# Patient Record
Sex: Male | Born: 1980 | Race: Black or African American | Hispanic: No | Marital: Single | Smoking: Former smoker
Health system: Southern US, Community
[De-identification: ages and names within clinical notes are randomized; demographics above are authoritative.]

## PROBLEM LIST (undated history)

## (undated) ENCOUNTER — Emergency Department (HOSPITAL_COMMUNITY): Payer: Commercial Managed Care - HMO

## (undated) DIAGNOSIS — J45909 Unspecified asthma, uncomplicated: Secondary | ICD-10-CM

## (undated) DIAGNOSIS — J189 Pneumonia, unspecified organism: Secondary | ICD-10-CM

## (undated) DIAGNOSIS — K5190 Ulcerative colitis, unspecified, without complications: Secondary | ICD-10-CM

## (undated) DIAGNOSIS — J40 Bronchitis, not specified as acute or chronic: Secondary | ICD-10-CM

---
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
## 1998-04-25 ENCOUNTER — Encounter: Admission: RE | Admit: 1998-04-25 | Discharge: 1998-04-25 | Payer: Self-pay | Admitting: Internal Medicine

## 1999-05-16 ENCOUNTER — Encounter: Admission: RE | Admit: 1999-05-16 | Discharge: 1999-05-16 | Payer: Self-pay | Admitting: Internal Medicine

## 2009-07-27 ENCOUNTER — Emergency Department (HOSPITAL_COMMUNITY): Admission: EM | Admit: 2009-07-27 | Discharge: 2009-07-27 | Payer: Self-pay | Admitting: Family Medicine

## 2009-09-28 ENCOUNTER — Emergency Department (HOSPITAL_COMMUNITY): Admission: EM | Admit: 2009-09-28 | Discharge: 2009-09-28 | Payer: Self-pay | Admitting: Emergency Medicine

## 2009-11-23 ENCOUNTER — Encounter (INDEPENDENT_AMBULATORY_CARE_PROVIDER_SITE_OTHER): Payer: Self-pay | Admitting: Internal Medicine

## 2009-11-23 ENCOUNTER — Ambulatory Visit: Payer: Self-pay | Admitting: Internal Medicine

## 2009-11-23 LAB — CONVERTED CEMR LAB
Albumin: 4.3 g/dL (ref 3.5–5.2)
Alkaline Phosphatase: 118 units/L — ABNORMAL HIGH (ref 39–117)
BUN: 8 mg/dL (ref 6–23)
Calcium: 9.6 mg/dL (ref 8.4–10.5)
Chloride: 103 meq/L (ref 96–112)
Eosinophils Absolute: 0.7 10*3/uL (ref 0.0–0.7)
Glucose, Bld: 79 mg/dL (ref 70–99)
Hemoglobin: 11.8 g/dL — ABNORMAL LOW (ref 13.0–17.0)
Lymphs Abs: 1.9 10*3/uL (ref 0.7–4.0)
MCV: 76.5 fL — ABNORMAL LOW (ref 78.0–100.0)
Monocytes Absolute: 0.5 10*3/uL (ref 0.1–1.0)
Monocytes Relative: 10 % (ref 3–12)
Neutrophils Relative %: 39 % — ABNORMAL LOW (ref 43–77)
Potassium: 4.3 meq/L (ref 3.5–5.3)
RBC: 4.97 M/uL (ref 4.22–5.81)
WBC: 5.2 10*3/uL (ref 4.0–10.5)

## 2010-04-15 LAB — POCT I-STAT, CHEM 8
HCT: 39 % (ref 39.0–52.0)
Hemoglobin: 13.3 g/dL (ref 13.0–17.0)
Potassium: 3.9 mEq/L (ref 3.5–5.1)
Sodium: 146 mEq/L — ABNORMAL HIGH (ref 135–145)

## 2011-05-03 ENCOUNTER — Emergency Department (INDEPENDENT_AMBULATORY_CARE_PROVIDER_SITE_OTHER)
Admission: EM | Admit: 2011-05-03 | Discharge: 2011-05-03 | Disposition: A | Discharge status: Home / Self Care | Payer: Self-pay | Source: Home / Self Care | Attending: Family Medicine | Admitting: Family Medicine

## 2011-05-03 ENCOUNTER — Encounter (HOSPITAL_COMMUNITY): Payer: Self-pay

## 2011-05-03 DIAGNOSIS — K5190 Ulcerative colitis, unspecified, without complications: Secondary | ICD-10-CM

## 2011-05-03 MED ORDER — SULFASALAZINE 500 MG PO TABS: 1000.0000 mg | ORAL_TABLET | Freq: Three times a day (TID) | ORAL | Status: DC

## 2011-05-03 NOTE — ED Notes (Signed)
States he is out of his medication for ulcerative colitis , and is requesting a refill; sees MD at [facility], but cannot be seen for another month or so; NAD, denies pain at present

## 2011-05-03 NOTE — ED Provider Notes (Signed)
History     CSN: [id number]  Arrival date & time 05/03/11  1211   First MD Initiated Contact with Patient 05/03/11 1211      Chief Complaint  Patient presents with  . Abdominal Pain    (Consider location/radiation/quality/duration/timing/severity/associated sxs/prior treatment) HPI Comments: Jerome Irwin presents for refill of his medication for ulcerative colitis. He is followed by his primary care provider at [facility] but could not get an appointment for another month. He denies any flares. He reports that he was diagnosed while incarcerated in [place]. He has not been evaluated by a gastroenterologist here yet.  Patient is a 31 y.o. male presenting with abdominal pain. The history is provided by the patient.  Abdominal Pain The primary symptoms of the illness include abdominal pain. The primary symptoms of the illness do not include fever, nausea, vomiting, diarrhea or hematochezia. The current episode started more than 2 days ago. The problem has not changed since onset. The abdominal pain began more than 2 days ago. The abdominal pain has been unchanged since its onset. The abdominal pain is generalized. The abdominal pain does not radiate. The abdominal pain is relieved by nothing.  Significant associated medical issues include inflammatory bowel disease.    History reviewed. No pertinent past medical history.  History reviewed. No pertinent past surgical history.  History reviewed. No pertinent family history.  History  Substance Use Topics  . Smoking status: Current Everyday Smoker  . Smokeless tobacco: Not on file  . Alcohol Use: No      Review of Systems  Constitutional: Negative.  Negative for fever.  HENT: Negative.   Eyes: Negative.   Respiratory: Negative.   Cardiovascular: Negative.   Gastrointestinal: Positive for abdominal pain. Negative for nausea, vomiting, diarrhea and hematochezia.  Genitourinary: Negative.   Musculoskeletal: Negative.   Skin:  Negative.   Neurological: Negative.     Allergies  Review of patient's allergies indicates no known allergies.  Home Medications   Current Outpatient Rx  Name Route Sig Dispense Refill  . SULFASALAZINE 500 MG PO TABS Oral Take 2 tablets (1,000 mg total) by mouth 3 (three) times daily. 90 tablet 1    BP 107/60  Pulse 80  Temp(Src) 99 F (37.2 C) (Oral)  Resp 12  SpO2 96%  Physical Exam  Nursing note and vitals reviewed. Constitutional: He is oriented to person, place, and time. He appears well-developed and well-nourished.  HENT:  Head: Normocephalic and atraumatic.  Eyes: EOM are normal.  Neck: Normal range of motion.  Pulmonary/Chest: Effort normal.  Abdominal: Soft. Normal appearance and bowel sounds are normal. There is tenderness in the periumbilical area and left lower quadrant. There is no rigidity, no rebound and no guarding.  Musculoskeletal: Normal range of motion.  Neurological: He is alert and oriented to person, place, and time.  Skin: Skin is warm and dry.  Psychiatric: His behavior is normal.    ED Course  Procedures (including critical care time)  Labs Reviewed - No data to display No results found.   1. Ulcerative colitis       MDM  Refilled medication for sulfasalazine, 1000 mg three times daily; follow up with Charmaine Downs, MD 05/03/11 1319

## 2011-05-03 NOTE — Discharge Instructions (Signed)
Continue your medication as directed. Please follow up with your primary provider as scheduled. Please return to care should your symptoms worsen in any way.

## 2011-08-08 ENCOUNTER — Emergency Department (INDEPENDENT_AMBULATORY_CARE_PROVIDER_SITE_OTHER)
Admission: EM | Admit: 2011-08-08 | Discharge: 2011-08-08 | Disposition: A | Discharge status: Home / Self Care | Payer: Self-pay | Source: Home / Self Care | Attending: Family Medicine | Admitting: Family Medicine

## 2011-08-08 ENCOUNTER — Encounter (HOSPITAL_COMMUNITY): Payer: Self-pay | Admitting: *Deleted

## 2011-08-08 DIAGNOSIS — L02811 Cutaneous abscess of head [any part, except face]: Secondary | ICD-10-CM

## 2011-08-08 DIAGNOSIS — L02818 Cutaneous abscess of other sites: Secondary | ICD-10-CM

## 2011-08-08 HISTORY — DX: Ulcerative colitis, unspecified, without complications: K51.90

## 2011-08-08 MED ORDER — SULFAMETHOXAZOLE-TRIMETHOPRIM 800-160 MG PO TABS: 1.0000 | ORAL_TABLET | Freq: Two times a day (BID) | ORAL | Status: AC

## 2011-08-08 NOTE — ED Provider Notes (Signed)
History     CSN: [id number]  Arrival date & time 08/08/11  1200   First MD Initiated Contact with Patient 08/08/11 1215      Chief Complaint  Patient presents with  . Abscess    (Consider location/radiation/quality/duration/timing/severity/associated sxs/prior treatment) HPI Comments: The patient reports he has an abscess on his scalp. Onset 3-4 months ago. States it has drained some purulent fluid on occasion. Now has noted hair loss. Slightly painful to touch. No known hx of mrsa.   The history is provided by the patient.    Past Medical History  Diagnosis Date  . Ulcerative colitis     History reviewed. No pertinent past surgical history.  History reviewed. No pertinent family history.  History  Substance Use Topics  . Smoking status: Current Everyday Smoker -- 0.5 packs/day    Types: Cigarettes  . Smokeless tobacco: Not on file  . Alcohol Use: Yes     socially      Review of Systems  HENT: Negative.   Respiratory: Negative.   Cardiovascular: Negative.   Gastrointestinal: Negative.   Genitourinary: Negative.   Musculoskeletal: Negative.     Allergies  Review of patient's allergies indicates no known allergies.  Home Medications   Current Outpatient Rx  Name Route Sig Dispense Refill  . SULFASALAZINE 500 MG PO TABS Oral Take 2 tablets (1,000 mg total) by mouth 3 (three) times daily. 90 tablet 1  . SULFAMETHOXAZOLE-TRIMETHOPRIM 800-160 MG PO TABS Oral Take 1 tablet by mouth every 12 (twelve) hours. 20 tablet 0    BP 113/77  Pulse 72  Temp 98.3 F (36.8 C) (Oral)  Resp 18  SpO2 97%  Physical Exam  Nursing note and vitals reviewed. Constitutional: He appears well-developed and well-nourished. No distress.  HENT:  Head: Normocephalic.       Small cystic mass right parietal area that is tender and flatulent. Punctured with a 22 ga steril needle. Moderate amt of purulent discharge expressed. Culture collected. Patient tolerated well. No dressing  needed.   Cardiovascular: Normal rate and regular rhythm.   Pulmonary/Chest: Effort normal and breath sounds normal.  Skin: Skin is warm and dry.    ED Course  Procedures (including critical care time)   Labs Reviewed  CULTURE, ROUTINE-ABSCESS   No results found.   1. Scalp abscess       MDM          Luna Glasgow, MD 08/08/11 1346

## 2011-08-08 NOTE — ED Notes (Signed)
Pt reports an abscess on his scalp the past 3-4 months.  He noticed some pus draining about a month ago.  He has lost the hair around this area

## 2011-08-11 LAB — CULTURE, ROUTINE-ABSCESS

## 2014-08-09 ENCOUNTER — Emergency Department (HOSPITAL_COMMUNITY)
Admission: EM | Admit: 2014-08-09 | Discharge: 2014-08-09 | Disposition: A | Discharge status: Home / Self Care | Payer: Self-pay | Attending: Emergency Medicine | Admitting: Emergency Medicine

## 2014-08-09 ENCOUNTER — Encounter (HOSPITAL_COMMUNITY): Payer: Self-pay | Admitting: Emergency Medicine

## 2014-08-09 ENCOUNTER — Emergency Department (HOSPITAL_COMMUNITY): Payer: Self-pay

## 2014-08-09 DIAGNOSIS — Z8719 Personal history of other diseases of the digestive system: Secondary | ICD-10-CM | POA: Insufficient documentation

## 2014-08-09 DIAGNOSIS — F1410 Cocaine abuse, uncomplicated: Secondary | ICD-10-CM | POA: Insufficient documentation

## 2014-08-09 DIAGNOSIS — J159 Unspecified bacterial pneumonia: Secondary | ICD-10-CM | POA: Insufficient documentation

## 2014-08-09 DIAGNOSIS — Z79899 Other long term (current) drug therapy: Secondary | ICD-10-CM | POA: Insufficient documentation

## 2014-08-09 DIAGNOSIS — Z720 Tobacco use: Secondary | ICD-10-CM | POA: Insufficient documentation

## 2014-08-09 DIAGNOSIS — J189 Pneumonia, unspecified organism: Secondary | ICD-10-CM

## 2014-08-09 LAB — CBC
HCT: 40.6 % (ref 39.0–52.0)
Hemoglobin: 13.3 g/dL (ref 13.0–17.0)
MCH: 28.6 pg (ref 26.0–34.0)
MCHC: 32.8 g/dL (ref 30.0–36.0)
MCV: 87.3 fL (ref 78.0–100.0)
Platelets: 312 10*3/uL (ref 150–400)
RBC: 4.65 MIL/uL (ref 4.22–5.81)
RDW: 15.8 % — ABNORMAL HIGH (ref 11.5–15.5)
WBC: 5.2 10*3/uL (ref 4.0–10.5)

## 2014-08-09 LAB — BASIC METABOLIC PANEL
Anion gap: 6 (ref 5–15)
BUN: 11 mg/dL (ref 6–20)
CO2: 27 mmol/L (ref 22–32)
Calcium: 9.3 mg/dL (ref 8.9–10.3)
Chloride: 106 mmol/L (ref 101–111)
Creatinine, Ser: 1.26 mg/dL — ABNORMAL HIGH (ref 0.61–1.24)
GFR calc Af Amer: >60 mL/min (ref >60)
GFR calc non Af Amer: >60 mL/min (ref >60)
Glucose, Bld: 114 mg/dL — ABNORMAL HIGH (ref 65–99)
Potassium: 3.5 mmol/L (ref 3.5–5.1)
Sodium: 139 mmol/L (ref 135–145)

## 2014-08-09 LAB — TROPONIN I: Troponin I: <0.03 ng/mL (ref <0.031)

## 2014-08-09 MED ORDER — AZITHROMYCIN 250 MG PO TABS: 250.0000 mg | ORAL_TABLET | Freq: Every day | ORAL | Status: DC

## 2014-08-09 NOTE — ED Notes (Signed)
Pt. Stated, I started having SOB about 2-3 weeks ago and it was worse yesterday and today.Marland Kitchen  i also use Cocaine , yesterday i had a gram .  Not sure if tha is causing it.

## 2014-08-09 NOTE — ED Notes (Addendum)
Pt stated that when he uses cocaine and has sex simultaneously he gets SOB. He is concerned that his heart and lungs are affected.

## 2014-08-09 NOTE — ED Provider Notes (Signed)
CSN: [id number]     Arrival date & time 08/09/14  1151 History   First MD Initiated Contact with Patient 08/09/14 1233     Chief Complaint  Patient presents with  . Shortness of Breath     (Consider location/radiation/quality/duration/timing/severity/associated sxs/prior Treatment) HPI Jerome Irwin is a 34 year old male with past medical history of ulcerative colitis, cocaine abuse who presents the ER complaining of intermittent shortness of breath. Patient states his symptoms have been intermittent over the past month. Patient states he associates this shortness of breath with using cocaine. Patient states "when I get high and exert myself, I become short of breath". Patient states it is particularly worse during intercourse. Patient denies having any shortness of breath episodes that are not associated with cocaine use. Patient also reports a productive cough over the past several weeks. Patient denies having any other health problems. Patient denies any chest pain, palpitations, headache, blurred vision, dizziness, weakness, nausea, vomiting, abdominal pain.  Past Medical History  Diagnosis Date  . Ulcerative colitis    History reviewed. No pertinent past surgical history. No family history on file. History  Substance Use Topics  . Smoking status: Current Every Day Smoker -- 0.50 packs/day    Types: Cigarettes  . Smokeless tobacco: Not on file  . Alcohol Use: Yes     Comment: socially    Review of Systems  Constitutional: Negative for fever.  HENT: Negative for trouble swallowing.   Eyes: Negative for visual disturbance.  Respiratory: Negative for shortness of breath.   Cardiovascular: Negative for chest pain.  Gastrointestinal: Negative for nausea, vomiting and abdominal pain.  Genitourinary: Negative for dysuria.  Musculoskeletal: Negative for neck pain.  Skin: Negative for rash.  Neurological: Negative for dizziness, weakness and numbness.  Psychiatric/Behavioral:  Negative.     Allergies  Review of patient's allergies indicates no known allergies.  Home Medications   Prior to Admission medications   Medication Sig Start Date End Date Taking? Authorizing Provider  azithromycin (ZITHROMAX) 250 MG tablet Take 1 tablet (250 mg total) by mouth daily. Take first 2 tablets together, then 1 every day until finished. 08/09/14   Dahlia Bailiff, PA-C  sulfaSALAzine (AZULFIDINE) 500 MG tablet Take 2 tablets (1,000 mg total) by mouth 3 (three) times daily. 05/03/11   Earlie Counts, MD   BP 93/62 mmHg  Pulse 50  Temp(Src) 98 F (36.7 C) (Oral)  Resp 18  SpO2 99% Physical Exam  Constitutional: He is oriented to person, place, and time. He appears well-developed and well-nourished. No distress.  HENT:  Head: Normocephalic and atraumatic.  Mouth/Throat: Oropharynx is clear and moist. No oropharyngeal exudate.  Eyes: Right eye exhibits no discharge. Left eye exhibits no discharge. No scleral icterus.  Neck: Normal range of motion.  Cardiovascular: Normal rate, regular rhythm and normal heart sounds.   No murmur heard. Pulmonary/Chest: Effort normal and breath sounds normal. No respiratory distress.  Abdominal: Soft. There is no tenderness.  Musculoskeletal: Normal range of motion. He exhibits no edema or tenderness.  Neurological: He is alert and oriented to person, place, and time. No cranial nerve deficit. Coordination normal.  Skin: Skin is warm and dry. No rash noted. He is not diaphoretic.  Psychiatric: He has a normal mood and affect.    ED Course  Procedures (including critical care time) Labs Review Labs Reviewed  BASIC METABOLIC PANEL - Abnormal; Notable for the following:    Glucose, Bld 114 (*)    Creatinine, Ser 1.26 (*)  All other components within normal limits  CBC - Abnormal; Notable for the following:    RDW 15.8 (*)    All other components within normal limits  TROPONIN I    Imaging Review Dg Chest 2 View  08/09/2014   CLINICAL  DATA:  Shortness of breath for several days.  EXAM: CHEST  2 VIEW  COMPARISON:  None.  FINDINGS: The heart size and mediastinal contours are within normal limits. Patchy airspace disease is seen in right middle lobe, suspicious for pneumonia. Left lung is clear. No evidence of pleural effusion.  IMPRESSION: Right middle lobe airspace disease, suspicious for pneumonia.   Electronically Signed   By: Earle Gell M.D.   On: 08/09/2014 12:34     EKG Interpretation   Date/Time:  Tuesday August 09 2014 11:56:05 EDT Ventricular Rate:  68 PR Interval:  130 QRS Duration: 84 QT Interval:  404 QTC Calculation: 429 R Axis:   87 Text Interpretation:  Normal sinus rhythm with sinus arrhythmia Normal ECG  No old tracing to compare Confirmed by Vanduser  MD, Taylor ([id number]) on  08/09/2014 1:42:25 PM      MDM   Final diagnoses:  CAP (community acquired pneumonia)  Cocaine abuse    Patient is asymptomatic throughout stay here. Despite patient's cocaine abuse, do not see any signs or symptoms consistent with end organ damage related to this. Patient is hemodynamic stable and in no acute distress. EKG without evidence of injury or ectopy. Troponin negative greater than 6 hours after onset of symptoms, which of note have been persisting and intermittent over the last month. Patient denies any chest pain. There is no concern for ACS at this time. Patient is PERC negative, no concern for PE. Chest x-ray does show evidence of possible pneumonia, given the patient does endorse a productive cough for the past several weeks and patient's history smoking along with these findings, we will treat patient as outpatient for community-acquired pneumonia. I discussed at length the dangers associated with cocaine use and abuse, and strongly recommended patient to stop using cocaine. Patient given substance abuse resources to follow up with as outpatient, which patient states he is interested in. Patient denies any suicidal or  homicidal ideation. Patient stable for discharge at this time, return precautions discussed, patient verbalizes understanding and agreement with this plan.  BP 93/62 mmHg  Pulse 50  Temp(Src) 98 F (36.7 C) (Oral)  Resp 18  SpO2 99%  Signed,  Dahlia Bailiff, PA-C 3:20 PM   Dahlia Bailiff, PA-C 08/09/14 [place], MD 08/12/14 1436

## 2014-08-09 NOTE — ED Notes (Signed)
NAD at this time. Pt is stable and leaving with his mother.

## 2014-08-09 NOTE — Discharge Instructions (Signed)
Pneumonia Pneumonia is an infection of the lungs.  CAUSES Pneumonia may be caused by bacteria or a virus. Usually, these infections are caused by breathing infectious particles into the lungs (respiratory tract). SIGNS AND SYMPTOMS   Cough.  Fever.  Chest pain.  Increased rate of breathing.  Wheezing.  Mucus production. DIAGNOSIS  If you have the common symptoms of pneumonia, your health care provider will typically confirm the diagnosis with a chest X-ray. The X-ray will show an abnormality in the lung (pulmonary infiltrate) if you have pneumonia. Other tests of your blood, urine, or sputum may be done to find the specific cause of your pneumonia. Your health care provider may also do tests (blood gases or pulse oximetry) to see how well your lungs are working. TREATMENT  Some forms of pneumonia may be spread to other people when you cough or sneeze. You may be asked to wear a mask before and during your exam. Pneumonia that is caused by bacteria is treated with antibiotic medicine. Pneumonia that is caused by the influenza virus may be treated with an antiviral medicine. Most other viral infections must run their course. These infections will not respond to antibiotics.  HOME CARE INSTRUCTIONS   Cough suppressants may be used if you are losing too much rest. However, coughing protects you by clearing your lungs. You should avoid using cough suppressants if you can.  Your health care provider may have prescribed medicine if he or she thinks your pneumonia is caused by bacteria or influenza. Finish your medicine even if you start to feel better.  Your health care provider may also prescribe an expectorant. This loosens the mucus to be coughed up.  Take medicines only as directed by your health care provider.  Do not smoke. Smoking is a common cause of bronchitis and can contribute to pneumonia. If you are a smoker and continue to smoke, your cough may last several weeks after your  pneumonia has cleared.  A cold steam vaporizer or humidifier in your room or home may help loosen mucus.  Coughing is often worse at night. Sleeping in a semi-upright position in a recliner or using a couple pillows under your head will help with this.  Get rest as you feel it is needed. Your body will usually let you know when you need to rest. PREVENTION A pneumococcal shot (vaccine) is available to prevent a common bacterial cause of pneumonia. This is usually suggested for:  People over 81 years old.  Patients on chemotherapy.  People with chronic lung problems, such as bronchitis or emphysema.  People with immune system problems. If you are over 65 or have a high risk condition, you may receive the pneumococcal vaccine if you have not received it before. In some countries, a routine influenza vaccine is also recommended. This vaccine can help prevent some cases of pneumonia.You may be offered the influenza vaccine as part of your care. If you smoke, it is time to quit. You may receive instructions on how to stop smoking. Your health care provider can provide medicines and counseling to help you quit. SEEK MEDICAL CARE IF: You have a fever. SEEK IMMEDIATE MEDICAL CARE IF:   Your illness becomes worse. This is especially true if you are elderly or weakened from any other disease.  You cannot control your cough with suppressants and are losing sleep.  You begin coughing up blood.  You develop pain which is getting worse or is uncontrolled with medicines.  Any of the symptoms  which initially brought you in for treatment are getting worse rather than better.  You develop shortness of breath or chest pain. MAKE SURE YOU:   Understand these instructions.  Will watch your condition.  Will get help right away if you are not doing well or get worse. Document Released: 01/14/2005 Document Revised: 05/31/2013 Document Reviewed: 04/05/2010 [facility] Patient Information 2015  [place], [place]. This information is not intended to replace advice given to you by your health care provider. Make sure you discuss any questions you have with your health care provider.  Shortness of Breath Shortness of breath means you have trouble breathing. It could also mean that you have a medical problem. You should get immediate medical care for shortness of breath. CAUSES   Not enough oxygen in the air such as with high altitudes or a smoke-filled room.  Certain lung diseases, infections, or problems.  Heart disease or conditions, such as angina or heart failure.  Low red blood cells (anemia).  Poor physical fitness, which can cause shortness of breath when you exercise.  Chest or back injuries or stiffness.  Being overweight.  Smoking.  Anxiety, which can make you feel like you are not getting enough air. DIAGNOSIS  Serious medical problems can often be found during your physical exam. Tests may also be done to determine why you are having shortness of breath. Tests may include:  Chest X-rays.  Lung function tests.  Blood tests.  An electrocardiogram (ECG).  An ambulatory electrocardiogram. An ambulatory ECG records your heartbeat patterns over a 24-hour period.  Exercise testing.  A transthoracic echocardiogram (TTE). During echocardiography, sound waves are used to evaluate how blood flows through your heart.  A transesophageal echocardiogram (TEE).  Imaging scans. Your health care provider may not be able to find a cause for your shortness of breath after your exam. In this case, it is important to have a follow-up exam with your health care provider as directed.  TREATMENT  Treatment for shortness of breath depends on the cause of your symptoms and can vary greatly. HOME CARE INSTRUCTIONS   Do not smoke. Smoking is a common cause of shortness of breath. If you smoke, ask for help to quit.  Avoid being around chemicals or things that may bother your  breathing, such as paint fumes and dust.  Rest as needed. Slowly resume your usual activities.  If medicines were prescribed, take them as directed for the full length of time directed. This includes oxygen and any inhaled medicines.  Keep all follow-up appointments as directed by your health care provider. SEEK MEDICAL CARE IF:   Your condition does not improve in the time expected.  You have a hard time doing your normal activities even with rest.  You have any new symptoms. SEEK IMMEDIATE MEDICAL CARE IF:   Your shortness of breath gets worse.  You feel light-headed, faint, or develop a cough not controlled with medicines.  You start coughing up blood.  You have pain with breathing.  You have chest pain or pain in your arms, shoulders, or abdomen.  You have a fever.  You are unable to walk up stairs or exercise the way you normally do. MAKE SURE YOU:  Understand these instructions.  Will watch your condition.  Will get help right away if you are not doing well or get worse. Document Released: 10/09/2000 Document Revised: 01/19/2013 Document Reviewed: 04/01/2011 [facility] Patient Information 2015 [place], [place]. This information is not intended to replace advice given to you  by your health care provider. Make sure you discuss any questions you have with your health care provider.  Stimulant Use Disorder-Cocaine Cocaine is one of a group of powerful drugs called stimulants. Cocaine has medical uses for stopping nosebleeds and for pain control before minor nose or dental surgery. However, cocaine is misused because of the effects that it produces. These effects include:   A feeling of extreme pleasure.  Alertness.  High energy. Common street names for cocaine include coke, crack, blow, snow, and nose candy. Cocaine is snorted, dissolved in water and injected, or smoked.  Stimulants are addictive because they activate regions of the brain that produce both the  pleasurable sensation of "reward" and psychological dependence. Together, these actions account for loss of control and the rapid development of drug dependence. This means you become ill without the drug (withdrawal) and need to keep using it to function.  Stimulant use disorder is use of stimulants that disrupts your daily life. It disrupts relationships with family and friends and how you do your job. Cocaine increases your blood pressure and heart rate. It can cause a heart attack or stroke. Cocaine can also cause death from irregular heart rate or seizures. SYMPTOMS Symptoms of stimulant use disorder with cocaine include:  Use of cocaine in larger amounts or over a longer period of time than intended.  Unsuccessful attempts to cut down or control cocaine use.  A lot of time spent obtaining, using, or recovering from the effects of cocaine.  A strong desire or urge to use cocaine (craving).  Continued use of cocaine in spite of major problems at work, school, or home because of use.  Continued use of cocaine in spite of relationship problems because of use.  Giving up or cutting down on important life activities because of cocaine use.  Use of cocaine over and over in situations when it is physically hazardous, such as driving a car.  Continued use of cocaine in spite of a physical problem that is likely related to use. Physical problems can include:  Malnutrition.  Nosebleeds.  Chest pain.  High blood pressure.  A hole that develops between the part of your nose that separates your nostrils (perforated nasal septum).  Lung and kidney damage.  Continued use of cocaine in spite of a mental problem that is likely related to use. Mental problems can include:  Schizophrenia-like symptoms.  Depression.  Bipolar mood swings.  Anxiety.  Sleep problems.  Need to use more and more cocaine to get the same effect, or lessened effect over time with use of the same amount of  cocaine (tolerance).  Having withdrawal symptoms when cocaine use is stopped, or using cocaine to reduce or avoid withdrawal symptoms. Withdrawal symptoms include:  Depressed or irritable mood.  Low energy or restlessness.  Bad dreams.  Poor or excessive sleep.  Increased appetite. DIAGNOSIS Stimulant use disorder is diagnosed by your health care provider. You may be asked questions about your cocaine use and how it affects your life. A physical exam may be done. A drug screen may be ordered. You may be referred to a mental health professional. The diagnosis of stimulant use disorder requires at least two symptoms within 12 months. The type of stimulant use disorder depends on the number of signs and symptoms you have. The type may be:  Mild. Two or three signs and symptoms.  Moderate. Four or five signs and symptoms.  Severe. Six or more signs and symptoms. TREATMENT Treatment for stimulant  use disorder is usually provided by mental health professionals with training in substance use disorders. The following options are available:  Counseling or talk therapy. Talk therapy addresses the reasons you use cocaine and ways to keep you from using again. Goals of talk therapy include:  Identifying and avoiding triggers for use.  Handling cravings.  Replacing use with healthy activities.  Support groups. Support groups provide emotional support, advice, and guidance.  Medicine. Certain medicines may decrease cocaine cravings or withdrawal symptoms. HOME CARE INSTRUCTIONS  Take medicines only as directed by your health care provider.  Identify the people and activities that trigger your cocaine use and avoid them.  Keep all follow-up visits as directed by your health care provider. SEEK MEDICAL CARE IF:  Your symptoms get worse or you relapse.  You are not able to take medicines as directed. SEEK IMMEDIATE MEDICAL CARE IF:  You have serious thoughts about hurting yourself or  others.  You have a seizure, chest pain, sudden weakness, or loss of speech or vision. [place] on Drug Abuse: motorcyclefax.com  Substance Abuse and Mental Health Services Administration: [email or web address].[email or web address] Document Released: 01/12/2000 Document Revised: 05/31/2013 Document Reviewed: 01/27/2013 [facility] Patient Information 2015 [place], [place]. This information is not intended to replace advice given to you by your health care provider. Make sure you discuss any questions you have with your health care provider.   Emergency Department Resource Guide 1) Find a Doctor and Pay Out of Pocket Although you won't have to find out who is covered by your insurance plan, it is a good idea to ask around and get recommendations. You will then need to call the office and see if the doctor you have chosen will accept you as a new patient and what types of options they offer for patients who are self-pay. Some doctors offer discounts or will set up payment plans for their patients who do not have insurance, but you will need to ask so you aren't surprised when you get to your appointment.  2) Contact Your Local Health Department Not all health departments have doctors that can see patients for sick visits, but many do, so it is worth a call to see if yours does. If you don't know where your local health department is, you can check in your phone book. The CDC also has a tool to help you locate your state's health department, and many state websites also have listings of all of their local health departments.  3) Find a [facility] [place] [facility] If your illness is not likely to be very severe or complicated, you may want to try a walk in clinic. These are popping up all over the country in pharmacies, drugstores, and shopping centers. They're usually staffed by nurse practitioners or physician assistants that have been trained to treat common illnesses and complaints. They're usually fairly  quick and inexpensive. However, if you have serious medical issues or chronic medical problems, these are probably not your best option.  No Primary Care Doctor: - Call Health Connect at  [phone] - they can help you locate a primary care doctor that  accepts your insurance, provides certain services, etc. - Physician Referral Service- [phone]  Chronic Pain Problems: Organization         Address  Phone   Notes  [facility]  [phone] Patients need to be referred by their primary care doctor.   Medication Assistance: Organization  Address  Phone   Notes  [facility] Medication [facility] [place] [place], [place] [address] [phone] --Must be a resident of [facility] -- Must have NO insurance coverage whatsoever (no Medicaid/ Medicare, etc.) -- The pt. MUST have a primary care doctor that directs their care regularly and follows them in the community   MedAssist  [phone]   [employer]  [phone]    Agencies that provide inexpensive medical care: Organization         Address  Phone   Notes  [place]  [phone]   [facility]    [phone]   [facility] [place], [place] [address] [phone]   [place] [address], [place] [phone]   Planned Parenthood    [phone]   [facility]    [phone]   [place] and [address], [place] Phone:  [phone], Fax:  [phone] Hours of Operation:  9 am - 6 pm, M-F.  Also accepts Medicaid/Medicare and self-pay.  [facility] for [place], [address], [place] Phone: [phone], Fax: [phone]. Hours of Operation:  8:30 am - 5:30 pm, M-F.  Also accepts Medicaid and self-pay.  [facility] [address], [place] Phone: [phone]    [place], [place], [place] [phone], Ext. [phone] Mondays & Thursdays: 7-9 AM.  First 15 patients are seen on a first come, first serve basis.    [place] Providers:  Organization         Address  Phone   Notes  [facility] [address], [place] [phone] Also accepts self-pay patients.  [facility] [address], [place]  [phone]   [place], [address], [place] [phone]   [facility] [address], [place] [phone]   Lucianne Lei [address], [place]   [phone] Only accepts [place] Access [place] patients after they have their name applied to their card.   Self-Pay (no insurance) in [facility]:  Organization         Address  Phone   Notes  Sickle Cell Patients, [facility] [place] [phone]   [facility] Urgent Care [place] [phone]   [facility] [place]  [place], [place], [place] [phone]   [facility]/Dr. Osei-Bonsu  [address] [place] or [address], [place] [phone] Phone number for both [place] and [place] locations is the same.  Urgent Medical and [facility] [address], [place] [phone]   [facility] [address], [place] or [address] [phone]   [facility] [address], [place] [phone], phone; [phone], fax Sees patients 1st and 3rd Saturday of every month.  Must not qualify for public or private insurance (i.e. Medicaid, Medicare, [place] Health Choice, Veterans' Benefits)  Household income should be no more than 200% of the poverty level The clinic cannot treat you if you are  pregnant or think you  are pregnant  Sexually transmitted diseases are not treated at the clinic.    Dental Care: Organization         Address  Phone  Notes  [facility] Department of [facility] [place] [phone] Accepts children up to age 102 who are enrolled in [place] or [place]; pregnant women with a Medicaid card; and children who have applied for Medicaid or [place] Health Choice, but were declined, whose parents can pay a reduced fee at time of service.  [facility] Department of [facility]  [address], [place] [phone] Accepts children up to age 17 who are enrolled in [place] or [place]; pregnant women with a Medicaid card; and children who have applied for Medicaid or [place] Health Choice, but were declined, whose parents can pay a reduced fee at time of service.  [place] Adult Dental Access PROGRAM  [place] [phone] Patients are seen by appointment only. Walk-ins are not accepted. [place] will see patients 45 years of age and older. Monday - Tuesday (8am-5pm) Most Wednesdays (8:30-5pm) $30 per visit, cash only  [facility] Adult Dental Access PRO[facility]  [address], [facility] [phone] Patients are seen by appointment only. Walk-ins are not accepted. [place] will see patients 51 years of age and older. One Wednesday Evening (Monthly: Volunteer Based).  $30 per visit, cash only  [place]  [phone] for adults; Children under age 39, call Graduate Pediatric Dentistry at [phone]. Children aged 3-14, please call [phone] to request a pediatric application.  Dental services are provided in all areas of dental care including fillings, crowns and bridges, complete and partial dentures, implants, gum treatment, root canals, and extractions. Preventive care is also provided. Treatment is provided to  both adults and children. Patients are selected via a lottery and there is often a waiting list.   [facility] [address], [place]  [phone] [email or web address]   [facility] [address] [place], [place] [phone], Ext. [phone] Second and Fourth Thursday of each month, opens at 6:30 AM; Clinic ends at 9 AM.  Patients are seen on a first-come first-served basis, and a limited number are seen during each clinic.   [facility]  [address] Hillard Danker [place], [place] [phone]   Eligibility Requirements You must have lived in [place], [place], or [place] counties for at least the last three months.   You cannot be eligible for state or federal sponsored [employer], including [employer], [place], or [employer].   You generally cannot be eligible for healthcare insurance through your employer.    How to apply: Eligibility screenings are held every Tuesday and Wednesday afternoon from 1:00 pm until 4:00 pm. You do not need an appointment for the interview!  [facility] [address], [place], [place]   [place]  [place] Department  [place]  [phone]    Behavioral Health Resources in the Community: Intensive Outpatient Programs Organization         Address  Phone  Notes  [place]. [address], [place], [place] [phone]   [facility] Outpatient [address], [place], [place]   ADS: Alcohol & Drug Svcs [address],  [place], [place]   [place] [address],  [place], [place] or [phone]   Substance Abuse Resources Organization         Address  Phone  Notes  Alcohol and Drug Services  [phone]   [place]  [phone]   [facility] [place]   Chinita Pester  [phone]   Residential & Outpatient Substance Abuse Program  [phone]   Psychological Services Organization         Address  Phone  Notes  [facility] [place] [facility] [place]  [place]  [phone]   [place] [address] [place] or [phone]    Mobile Crisis Teams Organization         Address  Phone  Notes  [facility], Mobile Crisis Care Unit  [phone]   [facility]  [address] [place], [place]   [place] [address] [place] [phone]    Self-Help/Support Groups Organization         Address  Phone             Notes  [place]. of [place] - variety of support groups  [place] Call for more information  Narcotics Anonymous (NA), [facility]aring [facility] [address], [employer] [place]  2 meetings at this location   Special educational needs teacher         Address  Phone  Notes  ASAP Residential Treatment [place],    [place]  [phone]   [facility]  [address], [place] [address]74163, [place], [place]   [place], [place] [phone] Admissions: 8am-3pm M-F  Incentives Substance [place] [address],    [place], [place] [phone]   [facility] [address] [place], [place], [place]   [facility] [address],  [place], [place]   [facility] - Intensive Outpatient [address] Kristeen Mans [address], [place], [place]   [facility] ([place].,  [place], [place] [phone] or [phone]   [facility] (RTS) [address], [place], [place] Accepts Medicaid  [facility] [place] [address]  [place] [phone] Substance Abuse/Addiction Treatment   [facility] Organization         Address  Phone  Notes  [facility]  [phone]   Domenic Schwab, PhD [address] Arlis Porta [place], [place]   [phone] or [phone]   [place], [place] [phone]   [facility] [address], [place], [place] [phone] Insurance/Medicaid/sponsorship through [facility] and Families [address] [place]                                    [place], [place] [phone] [place] [address], [place] [phone]    Dr. Adele Schilder  [phone]   [facility] Dept. 1) [address],  2) [place] 3)  [place] [address], [place] [phone]  [phone]   [place] [phone])  [phone] or [phone] (After Hours)

## 2014-08-09 NOTE — ED Notes (Signed)
Pt went to xray, xray will bring pt to room when done.

## 2014-09-16 ENCOUNTER — Encounter (HOSPITAL_COMMUNITY): Payer: Self-pay | Admitting: Nurse Practitioner

## 2014-09-16 ENCOUNTER — Emergency Department (HOSPITAL_COMMUNITY)
Admission: EM | Admit: 2014-09-16 | Discharge: 2014-09-16 | Disposition: A | Discharge status: Home / Self Care | Payer: Self-pay | Attending: Emergency Medicine | Admitting: Emergency Medicine

## 2014-09-16 ENCOUNTER — Emergency Department (HOSPITAL_COMMUNITY): Payer: Self-pay

## 2014-09-16 DIAGNOSIS — J189 Pneumonia, unspecified organism: Secondary | ICD-10-CM

## 2014-09-16 DIAGNOSIS — R531 Weakness: Secondary | ICD-10-CM | POA: Insufficient documentation

## 2014-09-16 DIAGNOSIS — Z720 Tobacco use: Secondary | ICD-10-CM | POA: Insufficient documentation

## 2014-09-16 DIAGNOSIS — K5190 Ulcerative colitis, unspecified, without complications: Secondary | ICD-10-CM | POA: Insufficient documentation

## 2014-09-16 DIAGNOSIS — Z79899 Other long term (current) drug therapy: Secondary | ICD-10-CM | POA: Insufficient documentation

## 2014-09-16 DIAGNOSIS — Z792 Long term (current) use of antibiotics: Secondary | ICD-10-CM | POA: Insufficient documentation

## 2014-09-16 DIAGNOSIS — J159 Unspecified bacterial pneumonia: Secondary | ICD-10-CM | POA: Insufficient documentation

## 2014-09-16 HISTORY — DX: Pneumonia, unspecified organism: J18.9

## 2014-09-16 LAB — BASIC METABOLIC PANEL
Anion gap: 8 (ref 5–15)
BUN: 7 mg/dL (ref 6–20)
CALCIUM: 9 mg/dL (ref 8.9–10.3)
CO2: 26 mmol/L (ref 22–32)
CREATININE: 1.13 mg/dL (ref 0.61–1.24)
Chloride: 106 mmol/L (ref 101–111)
GFR calc Af Amer: >60 mL/min (ref >60)
GLUCOSE: 104 mg/dL — AB (ref 65–99)
Potassium: 3.6 mmol/L (ref 3.5–5.1)
Sodium: 140 mmol/L (ref 135–145)

## 2014-09-16 LAB — CBC
HCT: 37.5 % — ABNORMAL LOW (ref 39.0–52.0)
Hemoglobin: 12.5 g/dL — ABNORMAL LOW (ref 13.0–17.0)
MCH: 29.4 pg (ref 26.0–34.0)
MCHC: 33.3 g/dL (ref 30.0–36.0)
MCV: 88.2 fL (ref 78.0–100.0)
Platelets: 329 10*3/uL (ref 150–400)
RBC: 4.25 MIL/uL (ref 4.22–5.81)
RDW: 14.6 % (ref 11.5–15.5)
WBC: 6 10*3/uL (ref 4.0–10.5)

## 2014-09-16 LAB — I-STAT TROPONIN, ED: TROPONIN I, POC: 0 ng/mL (ref 0.00–0.08)

## 2014-09-16 MED ORDER — DOXYCYCLINE HYCLATE 100 MG PO CAPS: 100.0000 mg | ORAL_CAPSULE | Freq: Two times a day (BID) | ORAL | Status: DC

## 2014-09-16 MED ORDER — DOXYCYCLINE HYCLATE 100 MG PO TABS
100.0000 mg | ORAL_TABLET | Freq: Once | ORAL | Status: AC
  Administered 2014-09-16: 100 mg via ORAL
  Filled 2014-09-16: qty 1

## 2014-09-16 NOTE — ED Provider Notes (Signed)
CSN: [id number]     Arrival date & time 09/16/14  1507 History   First MD Initiated Contact with Patient 09/16/14 1826     Chief Complaint  Patient presents with  . Shortness of Breath     (Consider location/radiation/quality/duration/timing/severity/associated sxs/prior Treatment) Patient is a 34 y.o. male presenting with shortness of breath.  Shortness of Breath Severity:  Mild Onset quality:  Gradual Duration:  2 days Timing:  Constant Progression:  Unchanged Chronicity:  Recurrent Context: not URI   Relieved by:  Nothing Worsened by:  Nothing tried Associated symptoms: no cough and no fever     Past Medical History  Diagnosis Date  . Ulcerative colitis   . Pneumonia    History reviewed. No pertinent past surgical history. History reviewed. No pertinent family history. Social History  Substance Use Topics  . Smoking status: Current Every Day Smoker -- 0.50 packs/day    Types: Cigarettes  . Smokeless tobacco: None  . Alcohol Use: Yes     Comment: socially    Review of Systems  Constitutional: Negative for fever.  Respiratory: Positive for shortness of breath. Negative for cough.   Neurological: Positive for weakness.  All other systems reviewed and are negative.     Allergies  Review of patient's allergies indicates no known allergies.  Home Medications   Prior to Admission medications   Medication Sig Start Date End Date Taking? Authorizing Provider  azithromycin (ZITHROMAX) 250 MG tablet Take 1 tablet (250 mg total) by mouth daily. Take first 2 tablets together, then 1 every day until finished. 08/09/14   Dahlia Bailiff, PA-C  doxycycline (VIBRAMYCIN) 100 MG capsule Take 1 capsule (100 mg total) by mouth 2 (two) times daily. One po bid x 10 days 09/16/14   [name], MD  sulfaSALAzine (AZULFIDINE) 500 MG tablet Take 2 tablets (1,000 mg total) by mouth 3 (three) times daily. 05/03/11   Earlie Counts, MD   BP 112/80 mmHg  Pulse 57  Temp(Src) 98.2 F (36.8 C)  (Oral)  Resp 16  Ht 5' 11"  (1.803 m)  Wt 160 lb (72.576 kg)  BMI 22.33 kg/m2  SpO2 97% Physical Exam  Constitutional: He is oriented to person, place, and time. He appears well-developed and well-nourished. No distress.  HENT:  Head: Normocephalic and atraumatic.  Mouth/Throat: No oropharyngeal exudate.  Eyes: EOM are normal. Pupils are equal, round, and reactive to light.  Neck: Normal range of motion. Neck supple.  Cardiovascular: Normal rate and regular rhythm.  Exam reveals no friction rub.   No murmur heard. Pulmonary/Chest: Effort normal and breath sounds normal. No respiratory distress. He has no wheezes. He has no rales.  Abdominal: He exhibits no distension. There is no tenderness. There is no rebound.  Musculoskeletal: Normal range of motion. He exhibits no edema.  Neurological: He is alert and oriented to person, place, and time.  Skin: No rash noted. He is not diaphoretic.  Nursing note and vitals reviewed.   ED Course  Procedures (including critical care time) Labs Review Labs Reviewed  BASIC METABOLIC PANEL - Abnormal; Notable for the following:    Glucose, Bld 104 (*)    All other components within normal limits  CBC - Abnormal; Notable for the following:    Hemoglobin 12.5 (*)    HCT 37.5 (*)    All other components within normal limits  Randolm Idol, ED    Imaging Review Dg Chest 2 View  09/16/2014   CLINICAL DATA:  Shortness of Breath for  3 days  EXAM: CHEST  2 VIEW  COMPARISON:  08/09/2014  FINDINGS: Cardiomediastinal silhouette is stable. No pulmonary edema. There is streaky right middle lobe atelectasis or infiltrate. Left lung is clear.  IMPRESSION: No pulmonary edema. Streaky right middle lobe atelectasis or infiltrate.   Electronically Signed   By: Lahoma Crocker M.D.   On: 09/16/2014 16:36   I have personally reviewed and evaluated these images and lab results as part of my medical decision-making.   EKG Interpretation None      MDM   Final  diagnoses:  Community acquired pneumonia    89 rolled male here shortness of breath. Was recently treated for pneumonia one month ago with a Z-Pak. He reports he felt better but now he feels a little bit short of breath again. He also endorses some mild weakness. Denies fever. No major productive cough. Here chest x-ray shows continued right middle lobe pneumonia. He is a smoker and smokes marijuana and cigarettes every day. Will give course of doxycycline. Counseled on not smoking. Stable for discharge. Given for follow-up as he does not have a PCP.    [name], MD 09/16/14 [phone]

## 2014-09-16 NOTE — ED Notes (Signed)
He has felt SOB since last night. No noted activity at onset. He states he was here last month for pneumonia and was given oral abx which he completed and felt better. He denies pain, cough, fevers, n/v. He is A&Ox4, resp e/u. He has felt fatigued this week also

## 2014-09-16 NOTE — Discharge Instructions (Signed)
Pneumonia Pneumonia is an infection of the lungs.  CAUSES Pneumonia may be caused by bacteria or a virus. Usually, these infections are caused by breathing infectious particles into the lungs (respiratory tract). SIGNS AND SYMPTOMS   Cough.  Fever.  Chest pain.  Increased rate of breathing.  Wheezing.  Mucus production. DIAGNOSIS  If you have the common symptoms of pneumonia, your health care provider will typically confirm the diagnosis with a chest X-ray. The X-ray will show an abnormality in the lung (pulmonary infiltrate) if you have pneumonia. Other tests of your blood, urine, or sputum may be done to find the specific cause of your pneumonia. Your health care provider may also do tests (blood gases or pulse oximetry) to see how well your lungs are working. TREATMENT  Some forms of pneumonia may be spread to other people when you cough or sneeze. You may be asked to wear a mask before and during your exam. Pneumonia that is caused by bacteria is treated with antibiotic medicine. Pneumonia that is caused by the influenza virus may be treated with an antiviral medicine. Most other viral infections must run their course. These infections will not respond to antibiotics.  HOME CARE INSTRUCTIONS   Cough suppressants may be used if you are losing too much rest. However, coughing protects you by clearing your lungs. You should avoid using cough suppressants if you can.  Your health care provider may have prescribed medicine if he or she thinks your pneumonia is caused by bacteria or influenza. Finish your medicine even if you start to feel better.  Your health care provider may also prescribe an expectorant. This loosens the mucus to be coughed up.  Take medicines only as directed by your health care provider.  Do not smoke. Smoking is a common cause of bronchitis and can contribute to pneumonia. If you are a smoker and continue to smoke, your cough may last several weeks after your  pneumonia has cleared.  A cold steam vaporizer or humidifier in your room or home may help loosen mucus.  Coughing is often worse at night. Sleeping in a semi-upright position in a recliner or using a couple pillows under your head will help with this.  Get rest as you feel it is needed. Your body will usually let you know when you need to rest. PREVENTION A pneumococcal shot (vaccine) is available to prevent a common bacterial cause of pneumonia. This is usually suggested for:  People over 8 years old.  Patients on chemotherapy.  People with chronic lung problems, such as bronchitis or emphysema.  People with immune system problems. If you are over 65 or have a high risk condition, you may receive the pneumococcal vaccine if you have not received it before. In some countries, a routine influenza vaccine is also recommended. This vaccine can help prevent some cases of pneumonia.You may be offered the influenza vaccine as part of your care. If you smoke, it is time to quit. You may receive instructions on how to stop smoking. Your health care provider can provide medicines and counseling to help you quit. SEEK MEDICAL CARE IF: You have a fever. SEEK IMMEDIATE MEDICAL CARE IF:   Your illness becomes worse. This is especially true if you are elderly or weakened from any other disease.  You cannot control your cough with suppressants and are losing sleep.  You begin coughing up blood.  You develop pain which is getting worse or is uncontrolled with medicines.  Any of the symptoms  which initially brought you in for treatment are getting worse rather than better.  You develop shortness of breath or chest pain. MAKE SURE YOU:   Understand these instructions.  Will watch your condition.  Will get help right away if you are not doing well or get worse. Document Released: 01/14/2005 Document Revised: 05/31/2013 Document Reviewed: 04/05/2010 [facility] Patient Information 2015  [place], [place]. This information is not intended to replace advice given to you by your health care provider. Make sure you discuss any questions you have with your health care provider.   Emergency Department Resource Guide 1) Find a Doctor and Pay Out of Pocket Although you won't have to find out who is covered by your insurance plan, it is a good idea to ask around and get recommendations. You will then need to call the office and see if the doctor you have chosen will accept you as a new patient and what types of options they offer for patients who are self-pay. Some doctors offer discounts or will set up payment plans for their patients who do not have insurance, but you will need to ask so you aren't surprised when you get to your appointment.  2) Contact Your Local Health Department Not all health departments have doctors that can see patients for sick visits, but many do, so it is worth a call to see if yours does. If you don't know where your local health department is, you can check in your phone book. The CDC also has a tool to help you locate your state's health department, and many state websites also have listings of all of their local health departments.  3) Find a [facility] If your illness is not likely to be very severe or complicated, you may want to try a walk in clinic. These are popping up all over the country in pharmacies, drugstores, and shopping centers. They're usually staffed by nurse practitioners or physician assistants that have been trained to treat common illnesses and complaints. They're usually fairly quick and inexpensive. However, if you have serious medical issues or chronic medical problems, these are probably not your best option.  No Primary Care Doctor: - Call Health Connect at  [phone] - they can help you locate a primary care doctor that  accepts your insurance, provides certain services, etc. - Physician Referral Service- [phone]  Chronic Pain  Problems: Organization         Address  Phone   Notes  [facility]  [phone] Patients need to be referred by their primary care doctor.   Medication Assistance: Organization         Address  Phone   Notes  [facility] Medication [facility] [address] [place], [place] [address] [phone] --Must be a resident of [facility] -- Must have NO insurance coverage whatsoever (no Medicaid/ Medicare, etc.) -- The pt. MUST have a primary care doctor that directs their care regularly and follows them in the community   [facility]ist  [phone]   [employer]  [phone]    Agencies that provide inexpensive medical care: Organization         Address  Phone   Notes  [place]  [phone]   [facility]    [phone]   [facility] [employer], [place] [address] [phone]   [place] [address] [place] [phone]  Planned Parenthood    [phone]   [facility]    [phone]   [facility] and [facility] [address], [place] Phone:  [phone], Fax:  [phone] Hours of Operation:  9 am - 6 pm, M-F.  Also accepts Medicaid/Medicare and self-pay.  [facility] [place], [address], [place] Phone: [phone], Fax: [phone]. Hours of Operation:  8:30 am - 5:30 pm, M-F.  Also accepts Medicaid and self-pay.  [facility] [address] [place] Phone: [phone]   [place], [place], [place] [phone], Ext. [phone] Mondays & Thursdays: 7-9 AM.  First 15 patients are seen on a first come, first serve basis.    [place] Providers:  Organization         Address  Phone   Notes  [facility] [address], [place] [phone] Also  accepts self-pay patients.  [facility] [address], [place]  [phone]   [place], [address], [place] [phone]   [facility] [address] [place] [phone]   Lucianne Lei [address], [place]   [phone] Only accepts [place] Access [place] patients after they have their name applied to their card.   Self-Pay (no insurance) in [facility]:  Organization         Address  Phone   Notes  Sickle Cell Patients, [facility] [place] [phone]   [facility] [place] [phone]   [facility] [place]  [place], [place], [place] [phone]   [facility]/Dr. Osei-Bonsu  [address] [place] or [address], [place] [phone] Phone number for both [place] and [place] locations is the same.  Urgent Medical and [facility] [address] [place] [phone]   [facility] [address] [place] or [address] [phone]   [facility] [address] [place] [phone], phone; [phone], fax Sees patients 1st and 3rd Saturday of every month.  Must not qualify for public or private insurance (i.e. Medicaid, Medicare, [place] Health Choice, Veterans' Benefits)  Household income should be no more than 200% of the poverty level The clinic cannot treat you if you are pregnant or think you are pregnant  Sexually transmitted diseases are not treated at the clinic.    Dental Care: Organization         Address  Phone  Notes  [facility] Department of [facility] [phone] Accepts children up to age 44 who are enrolled in [place] or [place]; pregnant  women with a Medicaid card; and children who have applied for Medicaid or [place] Health Choice, but were declined, whose parents can pay a reduced fee at time of service.  [facility] of [facility]  [address], [place] [phone] Accepts children up to age 58 who are enrolled in [place] or [place]; pregnant women with a Medicaid card; and children who have applied for Medicaid or [place], but were declined,  whose parents can pay a reduced fee at time of service.  [place] Adult Dental Access PROGRAM  [place] [phone] Patients are seen by appointment only. Walk-ins are not accepted. [place] will see patients 9 years of age and older. Monday - Tuesday (8am-5pm) Most Wednesdays (8:30-5pm) $30 per visit, cash only  [facility] Adult Dental Access PROGRAM  [address], [facility] [phone] Patients are seen by appointment only. Walk-ins are not accepted. [place] will see patients 35 years of age and older. One Wednesday Evening (Monthly: Volunteer Based).  $30 per visit, cash only  [place]  [phone] for adults; Children under age 2, call Graduate Pediatric Dentistry at [phone]. Children aged 19-14, please call [phone] to request a pediatric application.  Dental services are provided in all areas of dental care including fillings, crowns and bridges, complete and partial dentures, implants, gum treatment, root canals, and extractions. Preventive care is also provided. Treatment is provided to both adults and children. Patients are selected via a lottery and there is often a waiting list.   [facility] [address], [place]  [phone] [email or web address]   [facility] [address] [place], [place] [phone], Ext. [phone] Second and Fourth Thursday of each month, opens at 6:30 AM; Clinic ends at 9 AM.  Patients are  seen on a first-come first-served basis, and a limited number are seen during each clinic.   [facility]  [address] Hillard Danker [place], [place] [phone]   Eligibility Requirements You must have lived in [place], [place], or [place] counties for at least the last three months.   You cannot be eligible for state or federal sponsored [employer], including [employer], [place], or [employer].   You generally cannot be eligible for healthcare insurance through your employer.    How to apply: Eligibility screenings are held every Tuesday and Wednesday afternoon from 1:00 pm until 4:00 pm. You do not need an appointment for the interview!  [facility] [address], [place], [place]   [place]  [place] Department  [place]  [phone]    Behavioral Health Resources in the Community: Intensive Outpatient Programs Organization         Address  Phone  Notes  [place]. [address] [place], [place] [phone]   [facility] Outpatient [address] [place], [place]   ADS: Alcohol & Drug Svcs [address], [place], [place]   [place] [address]  [place], [place] or [phone]   Substance Abuse Resources Organization         Address  Phone  Notes  Alcohol and Drug Services  [phone]   [place]  [phone]   [facility] [place]   Chinita Pester  [phone]   Residential & Outpatient Substance Abuse Program  [phone]   Psychological Services Organization         Address  Phone  Notes  [facility]  [place]  [phone]   [place] [address], [place] or [phone]    Mobile Crisis  Teams Organization         Address  Phone  Notes  [facility]rapeutic Alternatives, Mobile  Crisis Care Unit  [phone]   [facility]  [address] [place], [place]   [facility] [place] [address] [place] [phone]    Self-Help/Support Groups Organization         Address  Phone             Notes  [place]. of [place] - variety of support groups  [place] Call for more information  Narcotics Anonymous (NA), [facility] [address], [employer] [place]  2 meetings at this location   Special educational needs teacher         Address  Phone  Notes  [facility] Residential Treatment [place],    [place]  [phone]   [facility]  [address], [place] [phone], [place], [place]   [place] [address], [place] [phone] Admissions: 8am-3pm M-F  Incentives Substance [place] [address]    [place], [place] [phone]   [facility] [address] [place], [place], [place]   [facility] [address]  [place], [place]   Insight Programs - Intensive Outpatient [address] Kristeen Mans [address], [place], [place]   [facility] ([place] [place].,  [place], [place] [phone] or [phone]   [facility] Treatment Services (RTS) [address] [place], [place] Accepts Medicaid  [facility] [place] [address]  [place] [phone] Substance Abuse/Addiction Treatment   [facility] Organization         Address  Phone  Notes  [facility]  [phone]   Domenic Schwab, PhD [address] Arlis Porta [place], [place]   [phone] or [phone]   [place], [place] [phone]   [facility] [address] [place], [place] [phone]  Insurance/Medicaid/sponsorship through [facility] and Families [address] [place]                                    [place], [place] [phone] [place] [address], [place] [phone]    Dr. Adele Schilder  [phone]   [facility] Dept. 1) [address] [place] 2) [place] 3)  [place] [address], [place] [phone]  [phone]   [place] [phone] or [phone] (After Hours)

## 2014-10-12 ENCOUNTER — Encounter (HOSPITAL_COMMUNITY): Payer: Self-pay

## 2014-10-12 ENCOUNTER — Emergency Department (HOSPITAL_COMMUNITY)
Admission: EM | Admit: 2014-10-12 | Discharge: 2014-10-12 | Disposition: A | Discharge status: Home / Self Care | Payer: Self-pay | Attending: Emergency Medicine | Admitting: Emergency Medicine

## 2014-10-12 DIAGNOSIS — Z720 Tobacco use: Secondary | ICD-10-CM | POA: Insufficient documentation

## 2014-10-12 DIAGNOSIS — Z79899 Other long term (current) drug therapy: Secondary | ICD-10-CM | POA: Insufficient documentation

## 2014-10-12 DIAGNOSIS — Z8701 Personal history of pneumonia (recurrent): Secondary | ICD-10-CM | POA: Insufficient documentation

## 2014-10-12 DIAGNOSIS — Z8719 Personal history of other diseases of the digestive system: Secondary | ICD-10-CM | POA: Insufficient documentation

## 2014-10-12 DIAGNOSIS — H6092 Unspecified otitis externa, left ear: Secondary | ICD-10-CM | POA: Insufficient documentation

## 2014-10-12 MED ORDER — CIPROFLOXACIN-DEXAMETHASONE 0.3-0.1 % OT SUSP
4.0000 [drp] | Freq: Once | OTIC | Status: AC
  Administered 2014-10-12: 4 [drp] via OTIC
  Filled 2014-10-12: qty 7.5

## 2014-10-12 NOTE — ED Provider Notes (Signed)
CSN: [id number]     Arrival date & time 10/12/14  1040 History   First MD Initiated Contact with Patient 10/12/14 1051     Chief Complaint  Patient presents with  . Otalgia   Jernard Reiber is 34 y.o. male who is a smoker who presents to the ED complaining of left ear pain for the past month. He reports a feeling of pressure and popping when eating. He reports it feels there is liquid in his ear. He reports his hearing is slightly decreased. He denies history of diabetes. He denies fevers, ear drainage, tinnitus, nasal congestion, headache, neck pain, neck stiffness, rashes, or recent ear infections.   (Consider location/radiation/quality/duration/timing/severity/associated sxs/prior Treatment) HPI  Past Medical History  Diagnosis Date  . Ulcerative colitis   . Pneumonia    No past surgical history on file. No family history on file. Social History  Substance Use Topics  . Smoking status: Current Every Day Smoker -- 0.50 packs/day    Types: Cigarettes  . Smokeless tobacco: None  . Alcohol Use: Yes     Comment: socially    Review of Systems  Constitutional: Negative for fever and chills.  HENT: Positive for ear pain. Negative for congestion, ear discharge, facial swelling, hearing loss, mouth sores, nosebleeds, postnasal drip, rhinorrhea, sinus pressure, sneezing, sore throat, tinnitus and trouble swallowing.   Eyes: Negative for pain, itching and visual disturbance.  Respiratory: Negative for cough.   Musculoskeletal: Negative for neck pain and neck stiffness.  Skin: Negative for rash.  Neurological: Negative for light-headedness and headaches.      Allergies  Review of patient's allergies indicates no known allergies.  Home Medications   Prior to Admission medications   Medication Sig Start Date End Date Taking? Authorizing Provider  azithromycin (ZITHROMAX) 250 MG tablet Take 1 tablet (250 mg total) by mouth daily. Take first 2 tablets together, then 1 every day  until finished. 08/09/14   Dahlia Bailiff, PA-C  doxycycline (VIBRAMYCIN) 100 MG capsule Take 1 capsule (100 mg total) by mouth 2 (two) times daily. One po bid x 10 days 09/16/14   [name], MD  sulfaSALAzine (AZULFIDINE) 500 MG tablet Take 2 tablets (1,000 mg total) by mouth 3 (three) times daily. 05/03/11   Earlie Counts, MD   BP 116/73 mmHg  Pulse 81  Temp(Src) 97.5 F (36.4 C) (Oral)  Resp 17  Wt 165 lb (74.844 kg)  SpO2 97% Physical Exam  Constitutional: He appears well-developed and well-nourished. No distress.  HENT:  Head: Normocephalic and atraumatic.  Right Ear: Hearing and external ear normal. No drainage, swelling or tenderness. No mastoid tenderness. Tympanic membrane is not injected and not erythematous. No decreased hearing is noted.  Left Ear: Hearing and external ear normal. No drainage, swelling or tenderness. No mastoid tenderness. Tympanic membrane is not injected and not erythematous. No decreased hearing is noted.  Mouth/Throat: Oropharynx is clear and moist. No oropharyngeal exudate.  Left ear EAC mild edema with whitish debris in the EAC. Left TM is partially visualized and appears intact and pearly gray- without erythema. Hearing is grossly intact. Right TM is normal. No right EAC edema. No mastoid tenderness.   Eyes: Conjunctivae are normal. Pupils are equal, round, and reactive to light. Right eye exhibits no discharge. Left eye exhibits no discharge.  Neck: Normal range of motion. Neck supple. No JVD present. No tracheal deviation present.  Cardiovascular: Normal rate and intact distal pulses.   Pulmonary/Chest: Effort normal. No respiratory distress.  Lymphadenopathy:  He has no cervical adenopathy.  Neurological: He is alert. Coordination normal.  Skin: Skin is warm and dry. No rash noted. He is not diaphoretic. No erythema. No pallor.  Psychiatric: He has a normal mood and affect. His behavior is normal.  Nursing note and vitals reviewed.   ED Course   Procedures (including critical care time) Labs Review Labs Reviewed - No data to display  Imaging Review No results found. .   EKG Interpretation None      Filed Vitals:   10/12/14 1044  BP: 116/73  Pulse: 81  Temp: 97.5 F (36.4 C)  TempSrc: Oral  Resp: 17  Weight: 165 lb (74.844 kg)  SpO2: 97%     MDM   Meds given in ED:  Medications  ciprofloxacin-dexamethasone (CIPRODEX) 0.3-0.1 % otic suspension 4 drop (4 drops Left Ear Given 10/12/14 1131)    Discharge Medication List as of 10/12/2014 11:32 AM      Final diagnoses:  Otitis externa, left   This is a 34 year old male who presented to the emergency department complaining of 3 weeks of left-sided ear pain. Patient with left-sided otitis externa on exam. Patient has a mild external auditory canal edema. His TM is partially visualized and appears intact. His hearing is grossly intact. No mastoid tenderness. No history of diabetes. No history of recent ear infections. Patient given ear wickand Ciprodex drops in the emergency department. Patient discharged with Ciprodex bottle and advised to place 4 drops in his left ear twice a day for 7 days. I advised the patient to follow-up with ENT or in the emergency department to have his ear wick removed in one week. I advised the patient to follow-up with their primary care provider this week. I advised the patient to return to the emergency department with new or worsening symptoms or new concerns. The patient verbalized understanding and agreement with plan.      Waynetta Pean, PA-C 10/12/14 Deville Liu, MD 10/12/14 [phone]

## 2014-10-12 NOTE — Discharge Instructions (Signed)
Ciprofloxacin; Dexamethasone ear suspension Use 4 drops in left ear twice a day for 7 days.  What is this medicine? CIPROFLOXACIN; DEXAMETHASONE (sip roe FLOX a sin; dex a METH a sone) is used to treat ear infections. It also stops the swelling and itching caused by the infection. This medicine may be used for other purposes; ask your health care provider or pharmacist if you have questions. COMMON BRAND NAME(S): Ciprodex Otic What should I tell my health care provider before I take this medicine? They need to know if you have any of these conditions: -any other active infection -viral ear infection -an unusual or allergic reaction to ciprofloxacin; dexamethasone, other medicines, foods, dyes, or preservatives -pregnant or trying to get pregnant -breast-feeding How should I use this medicine? This medicine is only for use in the ears. Wash your hands with soap and water. Do not insert any object or swab into the ear canal. Gently warm the bottle by holding it in the hand for 1 to 2 minutes. Shake the bottle immediately before using. Lie down on your side with the affected ear up. Try not to touch the tip of the dropper to your ear, fingertips, or other surface. Squeeze the bottle gently to put the prescribed number of drops in the ear canal. Stay in this position for 30 to 60 seconds to help the drops soak into the ear. Repeat the steps for the other ear if both ears are infected. Do not use your medicine more often than directed. Finish the full course of medicine prescribed by your doctor or health care professional even if you think your condition is better. Talk to your pediatrician regarding the use of this medicine in children. While this drug may be prescribed for children as young as in children 4 months of age and older for selected conditions, precautions do apply. Overdosage: If you think you have taken too much of this medicine contact a poison control center or emergency room at  once. NOTE: This medicine is only for you. Do not share this medicine with others. What if I miss a dose? If you miss a dose, use it as soon as you can. If it is almost time for your next dose, use only that dose. Do not use double or extra doses. What may interact with this medicine? Interactions are not expected. Do not use any other ear products without telling your doctor or health care professional. This list may not describe all possible interactions. Give your health care provider a list of all the medicines, herbs, non-prescription drugs, or dietary supplements you use. Also tell them if you smoke, drink alcohol, or use illegal drugs. Some items may interact with your medicine. What should I watch for while using this medicine? Tell your doctor or health care professional if your ear infection does not get better in a few days. If rash or allergic reaction occurs, stop using immediately and contact your doctor or health care professional. It is important that you keep the infected ear(s) clean and dry. When bathing, try not to get the infected ear(s) wet. Do not go swimming unless your doctor or health care professional has told you otherwise. To prevent the spread of infection, do not share ear products or share towels and washcloths with anyone else. What side effects may I notice from receiving this medicine? Side effects that you should report to your doctor or health care professional as soon as possible: -allergic reactions like skin rash, itching or hives,  swelling of the face, lips, or tongue -burning, itching, and redness -worsening ear pain Side effects that usually do not require medical attention (report to your doctor or health care professional if they continue or are bothersome): -abnormal feeling in the ear -headache -unpleasant feeling while putting the drops in the ear This list may not describe all possible side effects. Call your doctor for medical advice about side  effects. You may report side effects to FDA at 1-800-FDA-1088. Where should I keep my medicine? Keep out of the reach of children. Store at room temperature between 15 and 30 degrees C (59 and 86 degrees F). Do not freeze. Protect from light. Throw away any unused medicine after the expiration date. NOTE: This sheet is a summary. It may not cover all possible information. If you have questions about this medicine, talk to your doctor, pharmacist, or health care provider.  2015, Elsevier/Gold Standard. (2007-04-01 10:33:06) Otitis Externa Otitis externa is a bacterial or fungal infection of the outer ear canal. This is the area from the eardrum to the outside of the ear. Otitis externa is sometimes called "swimmer's ear." CAUSES  Possible causes of infection include:  Swimming in dirty water.  Moisture remaining in the ear after swimming or bathing.  Mild injury (trauma) to the ear.  Objects stuck in the ear (foreign body).  Cuts or scrapes (abrasions) on the outside of the ear. SIGNS AND SYMPTOMS  The first symptom of infection is often itching in the ear canal. Later signs and symptoms may include swelling and redness of the ear canal, ear pain, and yellowish-white fluid (pus) coming from the ear. The ear pain may be worse when pulling on the earlobe. DIAGNOSIS  Your health care provider will perform a physical exam. A sample of fluid may be taken from the ear and examined for bacteria or fungi. TREATMENT  Antibiotic ear drops are often given for 10 to 14 days. Treatment may also include pain medicine or corticosteroids to reduce itching and swelling. HOME CARE INSTRUCTIONS   Apply antibiotic ear drops to the ear canal as prescribed by your health care provider.  Take medicines only as directed by your health care provider.  If you have diabetes, follow any additional treatment instructions from your health care provider.  Keep all follow-up visits as directed by your health care  provider. PREVENTION   Keep your ear dry. Use the corner of a towel to absorb water out of the ear canal after swimming or bathing.  Avoid scratching or putting objects inside your ear. This can damage the ear canal or remove the protective wax that lines the canal. This makes it easier for bacteria and fungi to grow.  Avoid swimming in lakes, polluted water, or poorly chlorinated pools.  You may use ear drops made of rubbing alcohol and vinegar after swimming. Combine equal parts of white vinegar and alcohol in a bottle. Put 3 or 4 drops into each ear after swimming. SEEK MEDICAL CARE IF:   You have a fever.  Your ear is still red, swollen, painful, or draining pus after 3 days.  Your redness, swelling, or pain gets worse.  You have a severe headache.  You have redness, swelling, pain, or tenderness in the area behind your ear. MAKE SURE YOU:   Understand these instructions.  Will watch your condition.  Will get help right away if you are not doing well or get worse. Document Released: 01/14/2005 Document Revised: 05/31/2013 Document Reviewed: 01/31/2011 Exit[employer] Patient Information  2015 [employer]. This information is not intended to replace advice given to you by your health care provider. Make sure you discuss any questions you have with your health care provider.

## 2014-10-12 NOTE — ED Notes (Signed)
He c/o right-sided earache with "popping" when eating x 2 weeks. He is in no distress.

## 2014-10-15 ENCOUNTER — Encounter (HOSPITAL_COMMUNITY): Payer: Self-pay | Admitting: Emergency Medicine

## 2014-10-15 ENCOUNTER — Emergency Department (HOSPITAL_COMMUNITY)
Admission: EM | Admit: 2014-10-15 | Discharge: 2014-10-15 | Disposition: A | Discharge status: Home / Self Care | Payer: Self-pay | Attending: Emergency Medicine | Admitting: Emergency Medicine

## 2014-10-15 DIAGNOSIS — Z79899 Other long term (current) drug therapy: Secondary | ICD-10-CM | POA: Insufficient documentation

## 2014-10-15 DIAGNOSIS — H9202 Otalgia, left ear: Secondary | ICD-10-CM | POA: Insufficient documentation

## 2014-10-15 DIAGNOSIS — Z8719 Personal history of other diseases of the digestive system: Secondary | ICD-10-CM | POA: Insufficient documentation

## 2014-10-15 DIAGNOSIS — Z8701 Personal history of pneumonia (recurrent): Secondary | ICD-10-CM | POA: Insufficient documentation

## 2014-10-15 DIAGNOSIS — Z720 Tobacco use: Secondary | ICD-10-CM | POA: Insufficient documentation

## 2014-10-15 NOTE — Discharge Instructions (Signed)
Continue using antibiotic ear drops as prescribed.

## 2014-10-15 NOTE — ED Provider Notes (Signed)
CSN: [id number]     Arrival date & time 10/15/14  1224 History  This chart was scribed for non-physician practitioner, Hyman Bible, PA-C, working with Dora Schwartz, MD by Evelene Croon, ED Scribe. This patient was seen in room WTR5/WTR5 and the patient's care was started at 1:43 PM.    Chief Complaint  Patient presents with  . Otalgia    The history is provided by the patient. No language interpreter was used.     HPI Comments:  Jerome Irwin is a 34 y.o. male who presents to the Emergency Department complaining of left ear pain. Pt was seen in the ED on 10/12/14 for the same and diagnosed with an outer ear infection. He had a wick placed in the ear and was discharged with ear drops. He believes the wick has moved and would like it adjusted or replaced; notes he accidentally tried to put in headphones. Pt notes his pain has improved since discharge. He denies fever and drainage from the ear. No alleviating factors or new complaints/symptoms noted.   Past Medical History  Diagnosis Date  . Ulcerative colitis   . Pneumonia    History reviewed. No pertinent past surgical history. No family history on file. Social History  Substance Use Topics  . Smoking status: Current Every Day Smoker -- 0.50 packs/day    Types: Cigarettes  . Smokeless tobacco: None  . Alcohol Use: Yes     Comment: socially    Review of Systems  Constitutional: Negative for fever.  HENT: Positive for ear pain. Negative for ear discharge.     Allergies  Review of patient's allergies indicates no known allergies.  Home Medications   Prior to Admission medications   Medication Sig Start Date End Date Taking? Authorizing Provider  azithromycin (ZITHROMAX) 250 MG tablet Take 1 tablet (250 mg total) by mouth daily. Take first 2 tablets together, then 1 every day until finished. 08/09/14   Dahlia Bailiff, PA-C  doxycycline (VIBRAMYCIN) 100 MG capsule Take 1 capsule (100 mg total) by mouth 2 (two) times daily. One  po bid x 10 days 09/16/14   [name], MD  sulfaSALAzine (AZULFIDINE) 500 MG tablet Take 2 tablets (1,000 mg total) by mouth 3 (three) times daily. 05/03/11   Earlie Counts, MD   BP 118/72 mmHg  Pulse 77  Temp(Src) 97.9 F (36.6 C) (Oral)  Resp 18  SpO2 97% Physical Exam  Constitutional: He appears well-developed and well-nourished. No distress.  HENT:  Head: Normocephalic and atraumatic.  Right Ear: Tympanic membrane and ear canal normal. No mastoid tenderness.  Left Ear: No mastoid tenderness.  ear wick in place in left EAC  Right TM nml No mastoid tenderness   Eyes: Conjunctivae are normal.  Neck: Normal range of motion.  Cardiovascular: Normal rate and regular rhythm.   Pulmonary/Chest: Effort normal and breath sounds normal.  Musculoskeletal: Normal range of motion.  Neurological: He is alert.  Skin: Skin is warm and dry.  Nursing note and vitals reviewed.   ED Course  Procedures   DIAGNOSTIC STUDIES:  Oxygen Saturation is 97% on RA, normal by my interpretation.    COORDINATION OF CARE:  1:50 PM Discussed treatment plan with pt at bedside and pt agreed to plan.  Labs Review Labs Reviewed - No data to display  Imaging Review No results found. I have personally reviewed and evaluated these images and lab results as part of my medical decision-making.   EKG Interpretation None      MDM  Final diagnoses:  None  Patient presents today due to concern that his ear wick fell out.  He was diagnosed with an AOE three days ago.  Ear wick in place.  No mastoid tenderness.  He reports symptoms are improving.  Stable for discharge.  Return precautions given. I personally performed the services described in this documentation, which was scribed in my presence. The recorded information has been reviewed and is accurate.  I personally performed the services described in this documentation, which was scribed in my presence. The recorded information has been reviewed and  is accurate.     Hyman Bible, PA-C 10/15/14 1457  Onie Schwartz, MD 10/16/14 [phone]

## 2014-10-15 NOTE — ED Notes (Signed)
Per pt, states he has an ear infection and something placed in ear so he can receive drops-wants to see if it is still in place

## 2014-10-20 ENCOUNTER — Encounter (HOSPITAL_COMMUNITY): Payer: Self-pay | Admitting: Emergency Medicine

## 2014-10-20 ENCOUNTER — Emergency Department (HOSPITAL_COMMUNITY)
Admission: EM | Admit: 2014-10-20 | Discharge: 2014-10-20 | Disposition: A | Discharge status: Home / Self Care | Payer: Self-pay | Attending: Emergency Medicine | Admitting: Emergency Medicine

## 2014-10-20 DIAGNOSIS — Z720 Tobacco use: Secondary | ICD-10-CM | POA: Insufficient documentation

## 2014-10-20 DIAGNOSIS — Z79899 Other long term (current) drug therapy: Secondary | ICD-10-CM | POA: Insufficient documentation

## 2014-10-20 DIAGNOSIS — Z8701 Personal history of pneumonia (recurrent): Secondary | ICD-10-CM | POA: Insufficient documentation

## 2014-10-20 DIAGNOSIS — H9202 Otalgia, left ear: Secondary | ICD-10-CM | POA: Insufficient documentation

## 2014-10-20 DIAGNOSIS — Z8719 Personal history of other diseases of the digestive system: Secondary | ICD-10-CM | POA: Insufficient documentation

## 2014-10-20 NOTE — Discharge Instructions (Signed)
Ear Drops You have been diagnosed with a condition requiring you to put drops of medicine into your outer ear. HOME CARE INSTRUCTIONS   Put drops in the affected ear as instructed. After putting the drops in, you will need to lie down with the affected ear facing up for ten minutes so the drops will remain in the ear canal and run down and fill the canal. Continue using the ear drops for as long as directed by your health care provider.  Prior to getting up, put a cotton ball gently in your ear canal. Leave enough of the cotton ball out so it can be easily removed. Do not attempt to push this down into the canal with a cotton-tipped swab or other instrument.  Do not irrigate or wash out your ears if you have had a perforated eardrum or mastoid surgery, or unless instructed to do so by your health care provider.  Keep appointments with your health care provider as instructed.  Finish all medicine, or use for the length of time prescribed by your health care provider. Continue the drops even if your problem seems to be doing well after a couple days, or continue as instructed. SEEK MEDICAL CARE IF:  You become worse or develop increasing pain.  You notice any unusual drainage from your ear (particularly if the drainage has a bad smell).  You develop hearing difficulties.  You experience a serious form of dizziness in which you feel as if the room is spinning, and you feel nauseated (vertigo).  The outside of your ear becomes red or swollen or both. This may be a sign of an allergic reaction. MAKE SURE YOU:   Understand these instructions.  Will watch your condition.  Will get help right away if you are not doing well or get worse. Document Released: 01/08/2001 Document Revised: 01/19/2013 Document Reviewed: 08/11/2012 [facility] Patient Information 2015 [place], [place]. This information is not intended to replace advice given to you by your health care provider. Make sure you discuss any  questions you have with your health care provider.

## 2014-10-20 NOTE — ED Provider Notes (Signed)
CSN: [id number]     Arrival date & time 10/20/14  0941 History   First MD Initiated Contact with Patient 10/20/14 1006     Chief Complaint  Patient presents with  . recheck ear      (Consider location/radiation/quality/duration/timing/severity/associated sxs/prior Treatment) HPI Comments: Pt comes in with c/o needing the ear wick removed from his left ear. Pt was seen one weak ago and the ear wick was placed. Pt states that he is feeling better at this time. Denies fever or hearing problems.  The history is provided by the patient. No language interpreter was used.    Past Medical History  Diagnosis Date  . Ulcerative colitis   . Pneumonia    History reviewed. No pertinent past surgical history. No family history on file. Social History  Substance Use Topics  . Smoking status: Current Every Day Smoker -- 0.50 packs/day    Types: Cigarettes  . Smokeless tobacco: None  . Alcohol Use: Yes     Comment: socially    Review of Systems  All other systems reviewed and are negative.     Allergies  Review of patient's allergies indicates no known allergies.  Home Medications   Prior to Admission medications   Medication Sig Start Date End Date Taking? Authorizing Provider  azithromycin (ZITHROMAX) 250 MG tablet Take 1 tablet (250 mg total) by mouth daily. Take first 2 tablets together, then 1 every day until finished. 08/09/14   Dahlia Bailiff, PA-C  doxycycline (VIBRAMYCIN) 100 MG capsule Take 1 capsule (100 mg total) by mouth 2 (two) times daily. One po bid x 10 days 09/16/14   [name], MD  sulfaSALAzine (AZULFIDINE) 500 MG tablet Take 2 tablets (1,000 mg total) by mouth 3 (three) times daily. 05/03/11   Earlie Counts, MD   BP 119/75 mmHg  Pulse 81  Temp(Src) 98 F (36.7 C) (Oral)  Resp 17  SpO2 98% Physical Exam  Constitutional: He appears well-developed and well-nourished.  HENT:  Right Ear: External ear normal.  Left Ear: External ear normal.  Mouth/Throat: Oropharynx  is clear and moist.  Ear wick removed from left ear  Eyes: EOM are normal.  Cardiovascular: Normal rate and regular rhythm.   Pulmonary/Chest: Effort normal and breath sounds normal.  Musculoskeletal: Normal range of motion.  Nursing note and vitals reviewed.   ED Course  Procedures (including critical care time) Labs Review Labs Reviewed - No data to display  Imaging Review No results found. I have personally reviewed and evaluated these images and lab results as part of my medical decision-making.   EKG Interpretation None      MDM   Final diagnoses:  Otalgia, left    Ear wick removed. Pt is doing better at this time.    Glendell Docker, NP 10/20/14 [place], MD 10/21/14 [phone]

## 2014-10-20 NOTE — ED Notes (Signed)
Per pt, has been treated for left ear infection-finished antibiotics and needs "thing" removed from ear

## 2014-10-26 ENCOUNTER — Encounter (HOSPITAL_COMMUNITY): Payer: Self-pay | Admitting: Emergency Medicine

## 2014-10-26 ENCOUNTER — Emergency Department (HOSPITAL_COMMUNITY)
Admission: EM | Admit: 2014-10-26 | Discharge: 2014-10-26 | Disposition: A | Discharge status: Home / Self Care | Payer: Self-pay | Attending: Emergency Medicine | Admitting: Emergency Medicine

## 2014-10-26 DIAGNOSIS — Z8701 Personal history of pneumonia (recurrent): Secondary | ICD-10-CM | POA: Insufficient documentation

## 2014-10-26 DIAGNOSIS — Z8669 Personal history of other diseases of the nervous system and sense organs: Secondary | ICD-10-CM

## 2014-10-26 DIAGNOSIS — Z09 Encounter for follow-up examination after completed treatment for conditions other than malignant neoplasm: Secondary | ICD-10-CM | POA: Insufficient documentation

## 2014-10-26 DIAGNOSIS — Z79899 Other long term (current) drug therapy: Secondary | ICD-10-CM | POA: Insufficient documentation

## 2014-10-26 DIAGNOSIS — Z8719 Personal history of other diseases of the digestive system: Secondary | ICD-10-CM | POA: Insufficient documentation

## 2014-10-26 DIAGNOSIS — Z720 Tobacco use: Secondary | ICD-10-CM | POA: Insufficient documentation

## 2014-10-26 NOTE — ED Notes (Signed)
Per pt, has been treated for left ear infection for over 2 weeks-finished ear drops and wants to be rechecked for infection

## 2014-10-26 NOTE — Discharge Instructions (Signed)
Please follow up with a primary care provider for the Resource guide provided.

## 2014-10-26 NOTE — ED Provider Notes (Signed)
CSN: [id number]     Arrival date & time 10/26/14  1150 History  This chart was scribed for non-physician practitioner, Nona Dell, PA-C working with [name], MD by Rayna Sexton, ED scribe. This patient was seen in room WTR5/WTR5 and the patient's care was started at 12:19 PM.   Chief Complaint  Patient presents with  . ear check    The history is provided by the patient. No language interpreter was used.    HPI Comments: Jerome Irwin is a 34 y.o. male who presents to the Emergency Department for an ear check s/p taking his prescibed ear drops with his last dosage earlier today. Pt notes that he didn't take the prescription as prescribed but instead took it intermittently with his last dosage earlier today. Pt notes some mild chronic congestion but denies experiencing any of his initial symptoms and further denies current ear pain, ear discharge, fever, chills, sore throat or hearing issues. Pt reports "I just wanted to come back to have my ear rechecked".   Past Medical History  Diagnosis Date  . Ulcerative colitis   . Pneumonia    History reviewed. No pertinent past surgical history. No family history on file. Social History  Substance Use Topics  . Smoking status: Current Every Day Smoker -- 0.50 packs/day    Types: Cigarettes  . Smokeless tobacco: None  . Alcohol Use: Yes     Comment: socially    Review of Systems  Constitutional: Negative for fever and chills.  HENT: Positive for congestion. Negative for ear discharge, ear pain and sore throat.    Allergies  Review of patient's allergies indicates no known allergies.  Home Medications   Prior to Admission medications   Medication Sig Start Date End Date Taking? Authorizing Provider  azithromycin (ZITHROMAX) 250 MG tablet Take 1 tablet (250 mg total) by mouth daily. Take first 2 tablets together, then 1 every day until finished. 08/09/14   Dahlia Bailiff, PA-C  doxycycline (VIBRAMYCIN) 100 MG capsule Take  1 capsule (100 mg total) by mouth 2 (two) times daily. One po bid x 10 days 09/16/14   [name], MD  sulfaSALAzine (AZULFIDINE) 500 MG tablet Take 2 tablets (1,000 mg total) by mouth 3 (three) times daily. 05/03/11   Earlie Counts, MD   BP 120/76 mmHg  Pulse 67  Temp(Src) 98 F (36.7 C) (Oral)  Resp 16  SpO2 100% Physical Exam  Constitutional: He is oriented to person, place, and time. He appears well-developed and well-nourished.  HENT:  Head: Normocephalic and atraumatic.  Right Ear: Tympanic membrane and external ear normal.  Left Ear: Tympanic membrane and external ear normal. No drainage, swelling or tenderness. No foreign bodies. Tympanic membrane is not injected, not erythematous and not bulging.  Mouth/Throat: Oropharynx is clear and moist. No oropharyngeal exudate.  Eyes: Conjunctivae and EOM are normal. Right eye exhibits no discharge. Left eye exhibits no discharge. No scleral icterus.  Neck: Normal range of motion. Neck supple.  Cardiovascular: Normal rate.   Pulmonary/Chest: Effort normal.  Musculoskeletal: Normal range of motion.  Lymphadenopathy:    He has no cervical adenopathy.  Neurological: He is alert and oriented to person, place, and time.  Nursing note and vitals reviewed.   ED Course  Procedures  DIAGNOSTIC STUDIES: Oxygen Saturation is 100% on RA, normal by my interpretation.    COORDINATION OF CARE: 12:24 PM Discussed treatment plan with pt at bedside and pt agreed to plan.  Labs Review Labs Reviewed - No  data to display  Imaging Review No results found. I have personally reviewed and evaluated these images and lab results as part of my medical decision-making.  Filed Vitals:   10/26/14 1201  BP: 120/76  Pulse: 67  Temp: 98 F (36.7 C)  Resp: 16     MDM   Final diagnoses:  History of ear infection    Pt presents for ear recheck. He was seen in the ED 6 days ago for follow up of his ear infection that was dx on 9/14, was told to  continue taking his ear drops. He reports resolution of his ear infection with completion of the ear drops and is not longer having any sxs or complaints. Pt states he just came in to make sure his ear was better. VSS. Bilateral TMs nml. Plan to d/c pt. Pt given resource for PCP.  I personally performed the services described in this documentation, which was scribed in my presence. The recorded information has been reviewed and is accurate.    Chesley Noon [place], [place] 10/26/14 1245  [name], MD 10/26/14 [phone]

## 2014-11-10 ENCOUNTER — Emergency Department (HOSPITAL_COMMUNITY): Payer: Self-pay

## 2014-11-10 ENCOUNTER — Emergency Department (HOSPITAL_COMMUNITY)
Admission: EM | Admit: 2014-11-10 | Discharge: 2014-11-10 | Disposition: A | Discharge status: Home / Self Care | Payer: Self-pay | Attending: Emergency Medicine | Admitting: Emergency Medicine

## 2014-11-10 ENCOUNTER — Encounter (HOSPITAL_COMMUNITY): Payer: Self-pay | Admitting: Emergency Medicine

## 2014-11-10 DIAGNOSIS — R0600 Dyspnea, unspecified: Secondary | ICD-10-CM | POA: Insufficient documentation

## 2014-11-10 DIAGNOSIS — Z79899 Other long term (current) drug therapy: Secondary | ICD-10-CM | POA: Insufficient documentation

## 2014-11-10 DIAGNOSIS — K5190 Ulcerative colitis, unspecified, without complications: Secondary | ICD-10-CM | POA: Insufficient documentation

## 2014-11-10 DIAGNOSIS — Z792 Long term (current) use of antibiotics: Secondary | ICD-10-CM | POA: Insufficient documentation

## 2014-11-10 DIAGNOSIS — Z8701 Personal history of pneumonia (recurrent): Secondary | ICD-10-CM | POA: Insufficient documentation

## 2014-11-10 DIAGNOSIS — Z720 Tobacco use: Secondary | ICD-10-CM | POA: Insufficient documentation

## 2014-11-10 LAB — CBC
HCT: 41 % (ref 39.0–52.0)
HEMOGLOBIN: 13.5 g/dL (ref 13.0–17.0)
MCH: 29.6 pg (ref 26.0–34.0)
MCHC: 32.9 g/dL (ref 30.0–36.0)
MCV: 89.9 fL (ref 78.0–100.0)
PLATELETS: 280 10*3/uL (ref 150–400)
RBC: 4.56 MIL/uL (ref 4.22–5.81)
RDW: 13.9 % (ref 11.5–15.5)
WBC: 5.9 10*3/uL (ref 4.0–10.5)

## 2014-11-10 LAB — BRAIN NATRIURETIC PEPTIDE: B Natriuretic Peptide: 25 pg/mL (ref 0.0–100.0)

## 2014-11-10 LAB — D-DIMER, QUANTITATIVE: D-Dimer, Quant: <0.27 ug/mL-FEU (ref 0.00–0.48)

## 2014-11-10 MED ORDER — ALBUTEROL SULFATE (2.5 MG/3ML) 0.083% IN NEBU
5.0000 mg | INHALATION_SOLUTION | Freq: Once | RESPIRATORY_TRACT | Status: AC
  Administered 2014-11-10: 5 mg via RESPIRATORY_TRACT
  Filled 2014-11-10: qty 6

## 2014-11-10 NOTE — ED Provider Notes (Addendum)
CSN: [id number]     Arrival date & time 11/10/14  1800 History  By signing my name below, I, [name], attest that this documentation has been prepared under the direction and in the presence of Junius Creamer, NP-C. Electronically Signed: [name], ED Scribe. 11/10/2014. 9:02 PM.  Chief Complaint  Patient presents with  . Shortness of Breath   HPI Comments: One day of intermittent SOB recent Dx of pneumonia  The history is provided by the patient. No language interpreter was used.   HPI Comments: Jerome Irwin is a 34 y.o. male with hx of pneumonia who presents to the Emergency Department complaining of intermittent SOB onset one day ago. Pt has been seen 5 prior times in the ED for the same. Pt states that last night he was trying to breathe through his nose and was unable to do so normally due to congestion in his nose. He reports associated gradually worsening generalized weakness. He states that he has been using anti-biotic drops for an ear infection. He states that he was previously seen at [facility] two months ago for pneumonia and was placed on anti-biotics for his symptoms. He denies any other symptoms. Pt denies hx of asthma. Pt states that he is a smoker.    Past Medical History  Diagnosis Date  . Ulcerative colitis ([place])   . Pneumonia    History reviewed. No pertinent past surgical history. History reviewed. No pertinent family history. Social History  Substance Use Topics  . Smoking status: Current Every Day Smoker -- 0.50 packs/day    Types: Cigarettes  . Smokeless tobacco: None  . Alcohol Use: Yes     Comment: socially    Review of Systems  HENT: Positive for congestion.   Respiratory: Positive for cough and shortness of breath.       Allergies  Review of patient's allergies indicates no known allergies.  Home Medications   Prior to Admission medications   Medication Sig Start Date End Date Taking? Authorizing Provider  azithromycin (ZITHROMAX) 250  MG tablet Take 1 tablet (250 mg total) by mouth daily. Take first 2 tablets together, then 1 every day until finished. 08/09/14   Dahlia Bailiff, PA-C  doxycycline (VIBRAMYCIN) 100 MG capsule Take 1 capsule (100 mg total) by mouth 2 (two) times daily. One po bid x 10 days 09/16/14   [name], MD  sulfaSALAzine (AZULFIDINE) 500 MG tablet Take 2 tablets (1,000 mg total) by mouth 3 (three) times daily. 05/03/11   Earlie Counts, MD   BP 109/71 mmHg  Pulse 72  Temp(Src) 98.1 F (36.7 C) (Oral)  Resp 16  SpO2 96% Physical Exam  Constitutional: He is oriented to person, place, and time. He appears well-developed.  HENT:  Head: Normocephalic.  Right Ear: External ear normal.  Left Ear: External ear normal.  Eyes: Pupils are equal, round, and reactive to light.  Neck: Normal range of motion.  Cardiovascular: Normal rate and regular rhythm.   Pulmonary/Chest: Effort normal and breath sounds normal. No respiratory distress. He has no wheezes. He has no rales. He exhibits no tenderness.  Abdominal: Soft.  Musculoskeletal: Normal range of motion.  Neurological: He is alert and oriented to person, place, and time.  Skin: Skin is warm.  Vitals reviewed.   ED Course  Procedures (including critical care time) DIAGNOSTIC STUDIES: Oxygen Saturation is 96% on RA, normal by my interpretation.    COORDINATION OF CARE: 8:07PM-Discussed treatment plan with pt at bedside and pt agreed to plan.  8:46 PM- pt reports that he took iron pills in prison for anemia; will perform blood work. Pt denies risky sexual behavior or IV drug use, but admits to "snorting" cocaine.   Labs Review Labs Reviewed  CBC  BRAIN NATRIURETIC PEPTIDE  D-DIMER, QUANTITATIVE (NOT AT [facility])    Imaging Review Dg Chest 2 View  11/10/2014  CLINICAL DATA:  Chest pain, shortness breath, and weakness for 2 months. Ulcerative colitis. EXAM: CHEST  2 VIEW COMPARISON:  09/16/2014 FINDINGS: The heart size and mediastinal contours are within  normal limits. Both lungs are clear. No evidence of pneumothorax or pleural effusion. The visualized skeletal structures are unremarkable. IMPRESSION: No active cardiopulmonary disease. Electronically Signed   By: Earle Gell M.D.   On: 11/10/2014 18:26   I have personally reviewed and evaluated these images and lab results as part of my medical decision-making.   EKG Interpretation None     This is a 34 year old male who is being seen 5 previous times for complaints of shortness of breath.  He did have a documented pneumonia in July.  Subsequent x-rays have been normal, although he has received multiple courses of antibiotics for his symptoms.  He did have an external otitis.  At one point where he received Cortisporin otic.  This has since resolved as well. He does have a history of daily smoking half a pack per day as well as cocaine abuse.  This chest x-ray is normal.  His oxygen saturation is 96% is not tanpic,  tachycardic or febrile. Agent BNP is 25.  His d-dimer is negative.  His EKG was normal MDM   Final diagnoses:  Dyspnea   I personally performed the services described in this documentation, which was scribed in my presence. The recorded information has been reviewed and is accurate.     Junius Creamer, NP 11/10/14 [place], NP 11/10/14 [place], MD 11/10/14 5189  Junius Creamer, NP 12/17/14 [place], MD 12/19/14 [phone]

## 2014-11-10 NOTE — ED Notes (Signed)
Ambulated pt earlier and alerted NP to pt O2 sats. Pt desaturated to 88% when he first began walking, then rose to 96% for remainder of walk. Only stating that he felt SOB when he initially desated.

## 2014-11-10 NOTE — Discharge Instructions (Signed)
Today, no cause for your shortness of breath has been identified.  Your chest x-ray is normal.  Your BNP which is her cardiac marker is normal, your d-dimer, which tests for blood clots in the lungs is negative.  You have  been referred to the [facility] pulmonology for further evaluation.  Emergency Department Resource Guide 1) Find a Doctor and Pay Out of Pocket Although you won't have to find out who is covered by your insurance plan, it is a good idea to ask around and get recommendations. You will then need to call the office and see if the doctor you have chosen will accept you as a new patient and what types of options they offer for patients who are self-pay. Some doctors offer discounts or will set up payment plans for their patients who do not have insurance, but you will need to ask so you aren't surprised when you get to your appointment.  2) Contact Your Local Health Department Not all health departments have doctors that can see patients for sick visits, but many do, so it is worth a call to see if yours does. If you don't know where your local health department is, you can check in your phone book. The CDC also has a tool to help you locate your state's health department, and many state websites also have listings of all of their local health departments.  3) Find a [facility] [place] [facility] If your illness is not likely to be very severe or complicated, you may want to try a walk in clinic. These are popping up all over the country in pharmacies, drugstores, and shopping centers. They're usually staffed by nurse practitioners or physician assistants that have been trained to treat common illnesses and complaints. They're usually fairly quick and inexpensive. However, if you have serious medical issues or chronic medical problems, these are probably not your best option.  No Primary Care Doctor: - Call Health Connect at  [phone] - they can help you locate a primary care doctor that  accepts your  insurance, provides certain services, etc. - Physician Referral Service- [phone]  Chronic Pain Problems: Organization         Address  Phone   Notes  [facility]  [phone] Patients need to be referred by their primary care doctor.   Medication Assistance: Organization         Address  Phone   Notes  [facility] [address] [place], [place] [address] [phone] --Must be a resident of [facility] -- Must have NO insurance coverage whatsoever (no Medicaid/ Medicare, etc.) -- The pt. MUST have a primary care doctor that directs their care regularly and follows them in the community   MedAssist  [phone]   [employer]  [phone]    Agencies that provide inexpensive medical care: Organization         Address  Phone   Notes  [place]  [phone]   [facility]    [phone]   [facility] [place], [place] [address] [phone]   [place] [address] [place] [phone]   Planned Parenthood    [phone]   [facility]    [phone]   [place] and [place] [address], [place] Phone:  [phone], Fax:  [phone] Hours of Operation:  9 am - 6 pm, M-F.  Also accepts Medicaid/Medicare and self-pay.  [facility] for [place] [address] [place], [address], [place] Phone: [phone], Fax: [phone]. Hours of Operation:  8:30 am - 5:30 pm, M-F.  Also accepts Medicaid and self-pay.  [facility] [place] [address] [place] Phone: [phone]   [place], [place], [place] [phone], Ext. [phone] Mondays & Thursdays: 7-9 AM.  First 15 patients are seen on a first come, first serve basis.    [place] Providers:  Organization          Address  Phone   Notes  [facility] [address], [place] [phone] Also accepts self-pay patients.  [facility] [address], [place]  [phone]   [place], [address], [place] [phone]   [facility] [address], [place] [phone]   Lucianne Lei [address], [place]   [phone] Only accepts [place] Access [place] patients after they have their name applied to their card.   Self-Pay (no insurance) in [facility]:  Organization         Address  Phone   Notes  Sickle Cell Patients, [facility] [place] [phone]   [facility] [place] [phone]   [facility] [place]  [place], [place], [place] [phone]   [facility]/Dr. Osei-Bonsu  [address], [place] or [address], [place] [phone] Phone number for both [place] and [place] locations is the same.  [facility] [address] [place] [phone]   [facility] [address] [place] or [address] [phone]   [facility] [address] [place] [phone], phone; [phone], fax Sees patients 1st and 3rd Saturday of every month.  Must not qualify for public or private insurance (i.e. Medicaid, Medicare, [place] Health Choice, Veterans' Benefits)  Household income should be no more than 200% of the poverty level The clinic cannot treat you if you are pregnant or think you are pregnant  Sexually transmitted diseases are not treated at the clinic.    Dental Care: Organization         Address  Phone  Notes  [facility] Department of [facility] [phone] Accepts children up to age 4 who are enrolled in [place] or [place]; pregnant women with a Medicaid card; and children who have applied for Medicaid or [place] Health Choice, but were declined, whose parents can pay a reduced fee at time of service.  [facility] Department of [facility]  [address], [place] [phone] Accepts children up to age 33 who are enrolled in [place] or [place]; pregnant women with a Medicaid card; and children who have applied for Medicaid or [place] Health Choice, but were declined, whose parents can pay a reduced fee at time of service.  [place] Adult Dental Access PROGRAM  [place] [phone] Patients are seen by appointment only. Walk-ins are not accepted. [place] will see patients 18 years  of age and older. Monday - Tuesday (8am-5pm) Most Wednesdays (8:30-5pm) $30 per visit, cash only  [facility] Adult Dental Access PROGRAM  [address], [facility] [phone] Patients are seen by appointment only. Walk-ins are not accepted. [place] will see patients 28 years of age and older. One Wednesday Evening (Monthly: Volunteer Based).  $30 per visit, cash only  [place]  [phone] for adults; Children under age 66, call Graduate Pediatric Dentistry at [phone]. Children aged 30-14, please call [phone] to request a pediatric application.  Dental services are provided in all areas of dental care including fillings, crowns and bridges, complete and partial dentures, implants, gum treatment, root canals, and extractions. Preventive care is also provided. Treatment is provided to both adults and children. Patients are selected via a lottery and there is often a waiting list.   [facility] [address] [place]  [phone] [email or web address]   [facility] [address] [place], [place]  [phone], Ext. [phone] Second and Fourth Thursday of each month, opens at 6:30 AM; Clinic ends at 9 AM.  Patients are seen on a first-come first-served basis, and a limited number are seen during each clinic.   [facility]  [address] Hillard Danker [place], [place] [phone]   Eligibility Requirements You must have lived in [place], [place], or [place] counties for at least the last three months.   You cannot be eligible for state or federal sponsored [employer], including [employer], [place], or [employer].   You generally cannot be eligible for healthcare insurance through your employer.    How to apply: Eligibility screenings are held every Tuesday and Wednesday afternoon from 1:00 pm until 4:00 pm. You do not need an appointment for the interview!  [facility] [address] [place], [place]   [place]  [place] Department  [place]  [phone]    Behavioral Health Resources in the Community: Intensive Outpatient Programs Organization         Address  Phone  Notes  [place]. [address] [place], [place] [phone]   [facility] Outpatient [address] [place], [place]   ADS: Alcohol & Drug Svcs [address] [place], [place]   [place] [address]  [place], [place] or [phone]   Substance Abuse Resources Organization         Address  Phone  Notes  Alcohol and Drug Services  [phone]   [place]  [phone]   [facility] [place]   Chinita Pester  [phone]   Residential & Outpatient Substance Abuse Program  [phone]   Psychological Services Organization         Address  Phone  Notes  [facility] [place]  [place]  [phone]    [place] [address] [place] or [phone]    Mobile Crisis Teams Organization         Address  Phone  Notes  [facility]atives, Mobile Crisis Care Unit  [phone]   [facility] Psychotherapeutic Services  [address] [place], [place]   [facility] [address] [place] [phone]    Self-Help/Support Groups Organization  Address  Phone             Notes  [place]. of [place] - variety of support groups  [place] Call for more information  Narcotics Anonymous (NA), [facility] [address], [employer] [place]  2 meetings at this location   Special educational needs teacher         Address  Phone  Notes  ASAP Residential Treatment [place],    [place]  [phone]   [facility]  [address], [place] [phone], [place], [place]   [place], [place] [phone] Admissions: 8am-3pm M-F  Incentives Substance [place] [address]    [place], [place] [phone]   [facility] [address] [place], [place], [place]   [facility] [address],  [place], [place]   [facility] Programs - Intensive Outpatient [address] Kristeen Mans [address], [place], [place]   [facility] ([place].,  [place], [place] [phone] or [phone]   [facility] Services (RTS) [address], [place], [place] Accepts Medicaid  [facility] [place] [address],  [place] [phone] Substance Abuse/Addiction Treatment   [facility] Organization         Address  Phone  Notes  [facility]  [phone]   Domenic Schwab, PhD [address] Arlis [place], [place]   [phone] or [phone]   [place]  [place], [place] [phone]   [facility] [address], [place], [place] [phone] Insurance/Medicaid/sponsorship through [facility] and Families [address], [address] [place]                                    [place], [place] [phone] [place] [address], [place] [phone]    Dr. Adele Schilder  [phone]   [facility] Dept. 1) [address] [place] 2) [place] 3)  [place] [address], [place] [phone]  [phone]   [place] [phone] or [phone] (After Hours)

## 2014-11-10 NOTE — ED Notes (Addendum)
Pt reports he has been treated for sob/lung infection for the past few weeks. Has been on 2 abx and continues to feel worse. No wheezing or rhonchi noted, pt speaking in full sentences with no difficulty. Pt also reports generalized weakness. Pt has been smoking cigarettes. Pt's ear ache has resolved.

## 2015-02-01 ENCOUNTER — Emergency Department (HOSPITAL_COMMUNITY): Payer: Self-pay

## 2015-02-01 ENCOUNTER — Encounter (HOSPITAL_COMMUNITY): Payer: Self-pay | Admitting: *Deleted

## 2015-02-01 ENCOUNTER — Emergency Department (HOSPITAL_COMMUNITY)
Admission: EM | Admit: 2015-02-01 | Discharge: 2015-02-01 | Disposition: A | Discharge status: Home / Self Care | Payer: Self-pay | Attending: Physician Assistant | Admitting: Physician Assistant

## 2015-02-01 DIAGNOSIS — Z79899 Other long term (current) drug therapy: Secondary | ICD-10-CM | POA: Insufficient documentation

## 2015-02-01 DIAGNOSIS — Z8701 Personal history of pneumonia (recurrent): Secondary | ICD-10-CM | POA: Insufficient documentation

## 2015-02-01 DIAGNOSIS — R0602 Shortness of breath: Secondary | ICD-10-CM | POA: Insufficient documentation

## 2015-02-01 DIAGNOSIS — F17210 Nicotine dependence, cigarettes, uncomplicated: Secondary | ICD-10-CM | POA: Insufficient documentation

## 2015-02-01 DIAGNOSIS — R5383 Other fatigue: Secondary | ICD-10-CM | POA: Insufficient documentation

## 2015-02-01 DIAGNOSIS — R079 Chest pain, unspecified: Secondary | ICD-10-CM | POA: Insufficient documentation

## 2015-02-01 DIAGNOSIS — R0981 Nasal congestion: Secondary | ICD-10-CM | POA: Insufficient documentation

## 2015-02-01 DIAGNOSIS — Z8719 Personal history of other diseases of the digestive system: Secondary | ICD-10-CM | POA: Insufficient documentation

## 2015-02-01 LAB — CBC
HEMATOCRIT: 39.4 % (ref 39.0–52.0)
HEMOGLOBIN: 12.4 g/dL — AB (ref 13.0–17.0)
MCH: 27.8 pg (ref 26.0–34.0)
MCHC: 31.5 g/dL (ref 30.0–36.0)
MCV: 88.3 fL (ref 78.0–100.0)
Platelets: 311 10*3/uL (ref 150–400)
RBC: 4.46 MIL/uL (ref 4.22–5.81)
RDW: 13.9 % (ref 11.5–15.5)
WBC: 5 10*3/uL (ref 4.0–10.5)

## 2015-02-01 LAB — BASIC METABOLIC PANEL
Anion gap: 9 (ref 5–15)
BUN: 8 mg/dL (ref 6–20)
CHLORIDE: 108 mmol/L (ref 101–111)
CO2: 26 mmol/L (ref 22–32)
CREATININE: 1.19 mg/dL (ref 0.61–1.24)
Calcium: 9.4 mg/dL (ref 8.9–10.3)
GFR calc Af Amer: >60 mL/min (ref >60)
GFR calc non Af Amer: >60 mL/min (ref >60)
Glucose, Bld: 110 mg/dL — ABNORMAL HIGH (ref 65–99)
Potassium: 3.8 mmol/L (ref 3.5–5.1)
SODIUM: 143 mmol/L (ref 135–145)

## 2015-02-01 LAB — I-STAT TROPONIN, ED: Troponin i, poc: 0 ng/mL (ref 0.00–0.08)

## 2015-02-01 MED ORDER — PREDNISONE 20 MG PO TABS: 60.0000 mg | ORAL_TABLET | Freq: Every day | ORAL | Status: DC

## 2015-02-01 MED ORDER — PREDNISONE 20 MG PO TABS
60.0000 mg | ORAL_TABLET | Freq: Once | ORAL | Status: AC
  Administered 2015-02-01: 60 mg via ORAL
  Filled 2015-02-01: qty 3

## 2015-02-01 MED ORDER — AZITHROMYCIN 250 MG PO TABS: 250.0000 mg | ORAL_TABLET | Freq: Every day | ORAL | Status: DC

## 2015-02-01 MED ORDER — ALBUTEROL SULFATE HFA 108 (90 BASE) MCG/ACT IN AERS: 1.0000 | INHALATION_SPRAY | Freq: Four times a day (QID) | RESPIRATORY_TRACT | Status: DC | PRN

## 2015-02-01 NOTE — ED Notes (Signed)
Pt reports not feeling well and sob x 5 days. Pt denies fever or productive cough. Hx of pneumonia.

## 2015-02-01 NOTE — Discharge Instructions (Signed)
Jerome Irwin,  Nice meeting you! Please follow-up with your primary care provider. Below is a list of doctors in the area. Return to the emergency department if you develop chest pain, increasing shortness of breath, nausea/vomiting. Feel better soon!  S. Wendie Simmer, PA-C   Emergency Department Resource Guide 1) Find a Doctor and Pay Out of Pocket Although you won't have to find out who is covered by your insurance plan, it is a good idea to ask around and get recommendations. You will then need to call the office and see if the doctor you have chosen will accept you as a new patient and what types of options they offer for patients who are self-pay. Some doctors offer discounts or will set up payment plans for their patients who do not have insurance, but you will need to ask so you aren't surprised when you get to your appointment.  2) Contact Your Local Health Department Not all health departments have doctors that can see patients for sick visits, but many do, so it is worth a call to see if yours does. If you don't know where your local health department is, you can check in your phone book. The CDC also has a tool to help you locate your state's health department, and many state websites also have listings of all of their local health departments.  3) Find a [facility] If your illness is not likely to be very severe or complicated, you may want to try a walk in clinic. These are popping up all over the country in pharmacies, drugstores, and shopping centers. They're usually staffed by nurse practitioners or physician assistants that have been trained to treat common illnesses and complaints. They're usually fairly quick and inexpensive. However, if you have serious medical issues or chronic medical problems, these are probably not your best option.  No Primary Care Doctor: - Call Health Connect at  [phone] - they can help you locate a primary care doctor that  accepts your  insurance, provides certain services, etc. - Physician Referral Service- [phone]  Chronic Pain Problems: Organization         Address  Phone   Notes  [facility]  [phone] Patients need to be referred by their primary care doctor.   Medication Assistance: Organization         Address  Phone   Notes  [facility] Medication [facility] [address] [place], [place] [address] [phone] --Must be a resident of [facility] -- Must have NO insurance coverage whatsoever (no Medicaid/ Medicare, etc.) -- The pt. MUST have a primary care doctor that directs their care regularly and follows them in the community   MedAssist  [phone]   [employer]  [phone]    Agencies that provide inexpensive medical care: Organization         Address  Phone   Notes  [place]  [phone]   [facility]    [phone]   [facility] [place], [place] [address] [phone]   [place] [address], [place] [phone]   Planned Parenthood    [phone]   [facility]    [phone]   [place] and [place] [address], [place] Phone:  [phone], Fax:  [phone] Hours of Operation:  9 am - 6 pm,  M-F.  Also accepts Medicaid/Medicare and self-pay.  [facility] for [place], [address], [place] Phone: [phone], Fax: [phone]. Hours of Operation:  8:30 am - 5:30 pm, M-F.  Also accepts Medicaid and self-pay.  [facility] [place] [address] [place] Phone: [phone]   [place], [place], [place] [phone], Ext. [phone] Mondays & Thursdays: 7-9 AM.  First 15 patients are seen on a first come, first serve basis.    [place] Providers:  Organization          Address  Phone   Notes  [facility] [address],  [phone] Also accepts self-pay patients.  [facility] [address], [place]  [phone]   [place], [address], [place] [phone]   [facility] [address] [place] [phone]   Lucianne Lei [address], [place]   [phone] Only accepts [place] Access [place] patients after they have their name applied to their card.   Self-Pay (no insurance) in [facility]:  Organization         Address  Phone   Notes  Sickle Cell Patients, [facility] Internal [facility] [place] [phone]   [facility] [place] [phone]   [facility] [place]  [place], [place], [place] [phone]   [facility]/Dr. Osei-Bonsu  [address], [place] or [address], [place] [phone] Phone number for both [place] and [place] locations is the same.  Urgent Medical and [facility] [address] [place] [phone]   [facility] [address] [place] or [address] [phone]   [facility] [address], [address] [phone], phone; [phone], fax Sees patients 1st and 3rd Saturday of every month.  Must not qualify for public or private insurance (i.e. Medicaid, Medicare, [place] Health Choice, Veterans' Benefits)  Household income should be no more than 200% of the poverty level The clinic cannot treat you if you are pregnant or think you are pregnant  Sexually transmitted diseases are not treated at the clinic.    Dental Care: Organization         Address  Phone  Notes  [facility] Department of [facility] [place] [phone] Accepts children up to age 58 who are enrolled in [place] or [place]; pregnant women with a Medicaid card; and children who have applied for Medicaid or [place] Health Choice, but were declined, whose parents can pay a reduced fee at time of service.  [facility] Department of [facility]  [address], [place] [phone] Accepts children up to age 76 who are enrolled in [place] or [place]; pregnant women with a Medicaid card; and children who have applied for Medicaid or  Health Choice, but were declined, whose parents can pay a reduced fee at time of service.  [place] Adult Dental Access PROGRAM  [place] [phone] Patients are seen by appointment only. Walk-ins are not accepted. [place] will see patients 22 years of age and older. Monday -  Tuesday (8am-5pm) Most Wednesdays (8:30-5pm) $30 per visit, cash only  [facility] Adult Dental Access PROGRAM  [address], [facility] [phone] Patients are seen by appointment only. Walk-ins are not accepted. [place] will see patients 74 years of age and older. One Wednesday Evening (Monthly: Volunteer Based).  $30 per visit, cash only  [place]  [phone] for adults; Children under age 63, call Graduate Pediatric Dentistry at [phone]. Children aged 64-14, please call [phone] to request a pediatric application.  Dental services are provided in all areas of dental care including fillings, crowns and bridges, complete and partial dentures, implants, gum treatment, root canals, and extractions. Preventive care is also provided. Treatment is provided to both adults and children. Patients are selected via a lottery and there is often a waiting list.   [facility] [address], [place]  [phone] [email or web address]   [facility] [address] [place], [place]  [phone], Ext. [phone] Second and Fourth Thursday of each month, opens at 6:30 AM; Clinic ends at 9 AM.  Patients are seen on a first-come first-served basis, and a limited number are seen during each clinic.   [facility]  [address] Hillard Danker [place], [place] [phone]   Eligibility Requirements You must have lived in [place], [place], or [place] counties for at least the last three months.   You cannot be eligible for state or federal sponsored [employer], including [employer], [place], or [employer].   You generally cannot be eligible for healthcare insurance through your employer.    How to apply: Eligibility screenings are held every Tuesday and Wednesday afternoon from 1:00 pm until 4:00 pm. You do not need an appointment for the interview!  [facility] [address] [place], [place]   [place]  [place] Department  [place]  [phone]    Behavioral Health Resources in the Community: Intensive Outpatient Programs Organization         Address  Phone  Notes  [place]. [address] [place], [place] [phone]   [facility] Outpatient [address] [place], [place]   ADS: Alcohol & Drug Svcs [address] [place], [place]   [place] [address]  [place], [place] or [phone]   Substance Abuse Resources Organization         Address  Phone  Notes  Alcohol and Drug Services  [phone]   [place]  [phone]   [facility] [place]   Chinita Pester  [phone]   Residential & Outpatient Substance Abuse Program  [phone]   Psychological Services Organization         Address  Phone  Notes  [facility] [place]  [place]  [phone]    [place] [address] [place] or [phone]    Mobile Crisis Teams Organization         Address  Phone  Notes  [facility], Mobile Crisis Care Unit  [phone]   [facility]  [address] [place], [place]   [place] [address], [place] [phone]    Self-Help/Support Groups Organization         Address  Phone  Notes  Mental Health Assoc. of [place] - variety of support groups  [place] Call for more information  Narcotics Anonymous (NA), [facility] [address], [employer] [place]  2 meetings at this location   Special educational needs teacher         Address  Phone  Notes  [facility]idential [facility] [place],    [place]  [phone]   [facility]  [address], [place] [phone], [place], [place]   [place], [place] [phone] Admissions: 8am-3pm M-F  Incentives Substance [place] [address],    [place], [place] [phone]   [facility] [address] [place], [place], [place]   [facility] [address]  [place], [place]   [facility] Programs - Intensive Outpatient [address] Kristeen [address], [place], [place]   [facility] [place].,  [place], [place] [phone] or [phone]   [facility] (RTS) [address] [place], [place] Accepts Medicaid  [facility] [place] [address]  [place] [phone] Substance Abuse/Addiction Treatment   [facility] Organization         Address  Phone  Notes  [facility]  [phone]   Domenic Schwab, PhD [address] [place], [place]   [phone] or [phone]   [place]  [place], [place] [phone]   [facility] [address], [place], [place] [phone] Insurance/Medicaid/sponsorship through [facility] and Families [address] [place]                                    [place], [place] [phone] [place] [address] [place], [place] [phone]    Dr. Adele Schilder  [phone]   [facility] Dept. 1) [address] [place] 2) [place] 3)  [place] [address], [place] [phone]  [phone]   [place] [phone] or [phone] (After Hours)        Shortness of Breath Shortness of breath means you have trouble breathing. It could also mean that you have a medical problem. You should get immediate medical care for shortness of breath. CAUSES   Not enough oxygen in the air such as with high altitudes or a smoke-filled room.  Certain lung diseases, infections, or problems.  Heart disease or conditions, such as angina or heart failure.  Low red blood cells (anemia).  Poor physical fitness, which can cause shortness of breath when you exercise.  Chest or back injuries or stiffness.  Being overweight.  Smoking.  Anxiety, which can make you feel like you are not getting enough air. DIAGNOSIS  Serious medical problems can often be found during your physical exam. Tests may also be done to determine why you are having shortness of breath. Tests may include:  Chest X-rays.  Lung function tests.  Blood tests.  An electrocardiogram (ECG).  An ambulatory electrocardiogram. An ambulatory ECG records your heartbeat patterns over a 24-hour period.  Exercise testing.  A transthoracic echocardiogram (TTE). During echocardiography, sound waves are used to evaluate how blood flows through your  heart.  A transesophageal echocardiogram (TEE).  Imaging  scans. Your health care provider may not be able to find a cause for your shortness of breath after your exam. In this case, it is important to have a follow-up exam with your health care provider as directed.  TREATMENT  Treatment for shortness of breath depends on the cause of your symptoms and can vary greatly. HOME CARE INSTRUCTIONS   Do not smoke. Smoking is a common cause of shortness of breath. If you smoke, ask for help to quit.  Avoid being around chemicals or things that may bother your breathing, such as paint fumes and dust.  Rest as needed. Slowly resume your usual activities.  If medicines were prescribed, take them as directed for the full length of time directed. This includes oxygen and any inhaled medicines.  Keep all follow-up appointments as directed by your health care provider. SEEK MEDICAL CARE IF:   Your condition does not improve in the time expected.  You have a hard time doing your normal activities even with rest.  You have any new symptoms. SEEK IMMEDIATE MEDICAL CARE IF:   Your shortness of breath gets worse.  You feel light-headed, faint, or develop a cough not controlled with medicines.  You start coughing up blood.  You have pain with breathing.  You have chest pain or pain in your arms, shoulders, or abdomen.  You have a fever.  You are unable to walk up stairs or exercise the way you normally do. MAKE SURE YOU:  Understand these instructions.  Will watch your condition.  Will get help right away if you are not doing well or get worse.   This information is not intended to replace advice given to you by your health care provider. Make sure you discuss any questions you have with your health care provider.   Document Released: 10/09/2000 Document Revised: 01/19/2013 Document Reviewed: 04/01/2011 Elsevier Interactive Patient Education [employer].

## 2015-02-01 NOTE — ED Provider Notes (Signed)
CSN: [id number]     Arrival date & time 02/01/15  1216 History  By signing my name below, I, Jerome Irwin, attest that this documentation has been prepared under the direction and in the presence of Wendie Simmer, PA-C. Electronically Signed: [name] ED Scribe. 02/01/2015. 4:12 PM.    Chief Complaint  Patient presents with  . Shortness of Breath   The history is provided by the patient. No language interpreter was used.   HPI Comments: Jerome Irwin is a 35 y.o. male with hx of pneumonia (07/2014) who presents to the Emergency Department complaining of SOB with exertion onset 4-5 days ago with associated mild fatigue and nasal congestion; no alleviation attempts. The patient reports this episode is less severe than his prior pneumonia. He endorses chest pain (diffuse chest, non-radiating, pressure, transient, 4/10 pain scale, intermittent, non-exertional). Patient is a current smoker.  He denies hx of asthma, DVT, prolonged immobilization, recent travel, or recent surgery, cough, fever, nausea, vomiting, leg swelling/pain, color change in leg, palpitations.   Past Medical History  Diagnosis Date  . Ulcerative colitis ([place])   . Pneumonia    History reviewed. No pertinent past surgical history. History reviewed. No pertinent family history. Social History  Substance Use Topics  . Smoking status: Current Every Day Smoker -- 0.50 packs/day    Types: Cigarettes  . Smokeless tobacco: None  . Alcohol Use: Yes     Comment: socially    Review of Systems 10 Systems reviewed and all are negative for acute change except as noted in the HPI.   Allergies  Review of patient's allergies indicates no known allergies.  Home Medications   Prior to Admission medications   Medication Sig Start Date End Date Taking? Authorizing Provider  azithromycin (ZITHROMAX) 250 MG tablet Take 1 tablet (250 mg total) by mouth daily. Take first 2 tablets together, then 1 every day until finished. 08/09/14    Dahlia Bailiff, PA-C  doxycycline (VIBRAMYCIN) 100 MG capsule Take 1 capsule (100 mg total) by mouth 2 (two) times daily. One po bid x 10 days 09/16/14   [name], MD  sulfaSALAzine (AZULFIDINE) 500 MG tablet Take 2 tablets (1,000 mg total) by mouth 3 (three) times daily. 05/03/11   Earlie Counts, MD   BP 107/68 mmHg  Pulse 69  Temp(Src) 98.3 F (36.8 C) (Oral)  Resp 18  SpO2 96% Physical Exam  Constitutional: He is oriented to person, place, and time. He appears well-developed and well-nourished. No distress.  HENT:  Head: Normocephalic and atraumatic.  Mouth/Throat: Oropharynx is clear and moist. No oropharyngeal exudate.  Eyes: Conjunctivae and EOM are normal. Pupils are equal, round, and reactive to light. Right eye exhibits no discharge. Left eye exhibits no discharge. No scleral icterus.  Neck: Neck supple. No tracheal deviation present.  Cardiovascular: Normal rate, regular rhythm, normal heart sounds and intact distal pulses.  Exam reveals no gallop and no friction rub.   No murmur heard. Pulmonary/Chest: Effort normal and breath sounds normal. No respiratory distress. He has no wheezes. He has no rales. He exhibits no tenderness.  Abdominal: Soft. Bowel sounds are normal. He exhibits no distension and no mass. There is no tenderness. There is no rebound and no guarding.  Musculoskeletal: Normal range of motion. He exhibits no edema.  Lymphadenopathy:    He has no cervical adenopathy.  Neurological: He is alert and oriented to person, place, and time. Coordination normal.  Skin: Skin is warm and dry. No rash noted. He is  not diaphoretic. No erythema.  Psychiatric: He has a normal mood and affect. His behavior is normal.  Nursing note and vitals reviewed.   ED Course  Procedures   DIAGNOSTIC STUDIES: Oxygen Saturation is 96% on RA, normal by my interpretation.    COORDINATION OF CARE:  4:11 PM Discussed treatment plan with patient at bedside.  Patient acknowledges and agrees  with plan.    Labs Review Labs Reviewed  BASIC METABOLIC PANEL - Abnormal; Notable for the following:    Glucose, Bld 110 (*)    All other components within normal limits  CBC - Abnormal; Notable for the following:    Hemoglobin 12.4 (*)    All other components within normal limits  I-STAT TROPOININ, ED  Randolm Idol, ED    Imaging Review Dg Chest 2 View  02/01/2015  CLINICAL DATA:  Shortness of breath for 1 week, smoker, ulcerative colitis EXAM: CHEST  2 VIEW COMPARISON:  11/10/2014 FINDINGS: Normal heart size, mediastinal contours, and pulmonary vascularity. Minimal residual atelectasis in RIGHT middle lobe. Remaining lungs clear. No new infiltrate, pleural effusion or pneumothorax. IMPRESSION: Minimal residual atelectasis RIGHT middle lobe. Lungs otherwise clear. No new abnormalities. Electronically Signed   By: Lavonia Dana M.D.   On: 02/01/2015 14:21   I have personally reviewed and evaluated these images and lab results as part of my medical decision-making.   EKG Interpretation   Date/Time:  Wednesday February 01 2015 12:37:44 EST Ventricular Rate:  72 PR Interval:  134 QRS Duration: 94 QT Interval:  370 QTC Calculation: 405 R Axis:   77 Text Interpretation:  Normal sinus rhythm with sinus arrhythmia Normal ECG  ED PHYSICIAN INTERPRETATION AVAILABLE IN [facility] Confirmed by  TEST, Record ([id number]) on 02/02/2015 6:36:17 AM      MDM   Final diagnoses:  Shortness of breath  Patient non-toxic appearing and VSS. Most likely URI but CXR demonstrates minimal residual atelectasis in right middle lobe. He was treated empirically in July 2016 and October 2016 for PNA with similar CXR findings. Will also work-up for ACS (although less likely) given chest pain complaint.  Troponin, EKG, BMP, CBC unremarkable.  Will give z-pack, inhaler, and prednisone for symptomatic treatment.  Patient may be safely discharged home. Discussed reasons for return. Patient to follow-up with  primary care provider within one week. Patient in understanding and agreement with the plan. I personally performed the services described in this documentation, which was scribed in my presence. The recorded information has been reviewed and is accurate.   [place] Lions, PA-C 02/04/15 [place], MD 02/05/15 [phone]

## 2015-02-02 LAB — I-STAT TROPONIN, ED: Troponin i, poc: 0 ng/mL (ref 0.00–0.08)

## 2015-02-27 ENCOUNTER — Encounter (HOSPITAL_COMMUNITY): Payer: Self-pay | Admitting: Emergency Medicine

## 2015-02-27 ENCOUNTER — Emergency Department (HOSPITAL_COMMUNITY)
Admission: EM | Admit: 2015-02-27 | Discharge: 2015-02-27 | Disposition: A | Discharge status: Home / Self Care | Payer: Self-pay | Attending: Emergency Medicine | Admitting: Emergency Medicine

## 2015-02-27 ENCOUNTER — Emergency Department (HOSPITAL_COMMUNITY): Payer: Self-pay

## 2015-02-27 DIAGNOSIS — R0602 Shortness of breath: Secondary | ICD-10-CM | POA: Insufficient documentation

## 2015-02-27 DIAGNOSIS — F17210 Nicotine dependence, cigarettes, uncomplicated: Secondary | ICD-10-CM | POA: Insufficient documentation

## 2015-02-27 DIAGNOSIS — Z79899 Other long term (current) drug therapy: Secondary | ICD-10-CM | POA: Insufficient documentation

## 2015-02-27 DIAGNOSIS — Z8701 Personal history of pneumonia (recurrent): Secondary | ICD-10-CM | POA: Insufficient documentation

## 2015-02-27 DIAGNOSIS — Z7952 Long term (current) use of systemic steroids: Secondary | ICD-10-CM | POA: Insufficient documentation

## 2015-02-27 LAB — D-DIMER, QUANTITATIVE: D-Dimer, Quant: <0.27 ug/mL-FEU (ref 0.00–0.50)

## 2015-02-27 LAB — BASIC METABOLIC PANEL
ANION GAP: 10 (ref 5–15)
BUN: 9 mg/dL (ref 6–20)
CHLORIDE: 106 mmol/L (ref 101–111)
CO2: 26 mmol/L (ref 22–32)
Calcium: 9.7 mg/dL (ref 8.9–10.3)
Creatinine, Ser: 1.08 mg/dL (ref 0.61–1.24)
GFR calc Af Amer: >60 mL/min (ref >60)
GLUCOSE: 90 mg/dL (ref 65–99)
POTASSIUM: 4.1 mmol/L (ref 3.5–5.1)
Sodium: 142 mmol/L (ref 135–145)

## 2015-02-27 LAB — I-STAT TROPONIN, ED: Troponin i, poc: 0 ng/mL (ref 0.00–0.08)

## 2015-02-27 LAB — CBC WITH DIFFERENTIAL/PLATELET
BASOS ABS: 0.1 10*3/uL (ref 0.0–0.1)
Basophils Relative: 1 %
Eosinophils Absolute: 0.4 10*3/uL (ref 0.0–0.7)
Eosinophils Relative: 6 %
HEMATOCRIT: 39.9 % (ref 39.0–52.0)
HEMOGLOBIN: 13.2 g/dL (ref 13.0–17.0)
LYMPHS ABS: 2.9 10*3/uL (ref 0.7–4.0)
LYMPHS PCT: 51 %
MCH: 29.5 pg (ref 26.0–34.0)
MCHC: 33.1 g/dL (ref 30.0–36.0)
MCV: 89.1 fL (ref 78.0–100.0)
Monocytes Absolute: 0.4 10*3/uL (ref 0.1–1.0)
Monocytes Relative: 7 %
NEUTROS ABS: 2 10*3/uL (ref 1.7–7.7)
NEUTROS PCT: 35 %
PLATELETS: 293 10*3/uL (ref 150–400)
RBC: 4.48 MIL/uL (ref 4.22–5.81)
RDW: 13.8 % (ref 11.5–15.5)
WBC: 5.7 10*3/uL (ref 4.0–10.5)

## 2015-02-27 NOTE — ED Provider Notes (Signed)
CSN: [id number]     Arrival date & time 02/27/15  1552 History   First MD Initiated Contact with Patient 02/27/15 1851     Chief Complaint  Patient presents with  . Cough    HPI   Jerome Irwin is a 35 y.o. male with a PMH of UC, pneumonia, tobacco use who presents to the ED with fatigue and shortness of breath. He reports he has experienced these symptoms intermittently since September, and reports his most recent episode started one week ago. He states his symptoms are constant. He denies exacerbating or alleviating factors. He notes he quit smoking several days ago, and thought this would help. He denies fever, chills, cough, congestion, chest pain, abdominal pain, N/V, numbness, weakness, paresthesia. He reports recent travel, and states he drove to [place] approximately 2 weeks ago. He denies recent surgery, history of malignancy, history of DVT/PE, lower extremity edema.   Past Medical History  Diagnosis Date  . Ulcerative colitis ([place])   . Pneumonia    History reviewed. No pertinent past surgical history. History reviewed. No pertinent family history. Social History  Substance Use Topics  . Smoking status: Current Every Day Smoker -- 0.50 packs/day    Types: Cigarettes  . Smokeless tobacco: None  . Alcohol Use: Yes     Comment: socially     Review of Systems  Constitutional: Positive for fatigue. Negative for fever and chills.  HENT: Negative for congestion.   Respiratory: Positive for shortness of breath. Negative for cough.   Cardiovascular: Negative for chest pain and leg swelling.  Gastrointestinal: Negative for nausea, vomiting, diarrhea, constipation and anal bleeding.  Neurological: Negative for dizziness, syncope, weakness, light-headedness and numbness.  All other systems reviewed and are negative.     Allergies  Review of patient's allergies indicates no known allergies.  Home Medications   Prior to Admission medications   Medication Sig Start Date  End Date Taking? Authorizing Provider  albuterol (PROVENTIL HFA;VENTOLIN HFA) 108 (90 Base) MCG/ACT inhaler Inhale 1-2 puffs into the lungs every 6 (six) hours as needed for wheezing or shortness of breath. 02/01/15   Cabell Lions, PA-C  azithromycin (ZITHROMAX) 250 MG tablet Take 1 tablet (250 mg total) by mouth daily. Take first 2 tablets together, then 1 every day until finished. 02/01/15   [place] Lions, PA-C  doxycycline (VIBRAMYCIN) 100 MG capsule Take 1 capsule (100 mg total) by mouth 2 (two) times daily. One po bid x 10 days 09/16/14   [name], MD  predniSONE (DELTASONE) 20 MG tablet Take 3 tablets (60 mg total) by mouth daily. 02/01/15   [place] Lions, PA-C  sulfaSALAzine (AZULFIDINE) 500 MG tablet Take 2 tablets (1,000 mg total) by mouth 3 (three) times daily. 05/03/11   Earlie Counts, MD    BP 105/77 mmHg  Pulse 61  Temp(Src) 98.5 F (36.9 C) (Oral)  Resp 20  SpO2 96% Physical Exam  Constitutional: He is oriented to person, place, and time. He appears well-developed and well-nourished. No distress.  HENT:  Head: Normocephalic and atraumatic.  Right Ear: External ear normal.  Left Ear: External ear normal.  Nose: Nose normal.  Mouth/Throat: Uvula is midline, oropharynx is clear and moist and mucous membranes are normal.  Eyes: Conjunctivae, EOM and lids are normal. Pupils are equal, round, and reactive to light. Right eye exhibits no discharge. Left eye exhibits no discharge. No scleral icterus.  Neck: Normal range of motion. Neck supple.  Cardiovascular: Normal rate, regular rhythm,  normal heart sounds, intact distal pulses and normal pulses.   Pulmonary/Chest: Effort normal and breath sounds normal. No respiratory distress. He has no wheezes. He has no rales.  Abdominal: Soft. Normal appearance and bowel sounds are normal. He exhibits no distension and no mass. There is no tenderness. There is no rigidity, no rebound and no guarding.  Musculoskeletal: Normal  range of motion. He exhibits no edema or tenderness.  Neurological: He is alert and oriented to person, place, and time. He has normal strength. No sensory deficit.  Skin: Skin is warm, dry and intact. No rash noted. He is not diaphoretic. No erythema. No pallor.  Psychiatric: He has a normal mood and affect. His speech is normal and behavior is normal.  Nursing note and vitals reviewed.   ED Course  Procedures (including critical care time)  Labs Review Labs Reviewed  CBC WITH DIFFERENTIAL/PLATELET  BASIC METABOLIC PANEL  D-DIMER, QUANTITATIVE (NOT AT [facility])  Randolm Idol, ED    Imaging Review Dg Chest 2 View  02/27/2015  CLINICAL DATA:  35 year old current smoker presenting with recurrent cough and shortness of breath after recent treatment for bronchitis. Symptoms returned after he completed his medications. EXAM: CHEST  2 VIEW COMPARISON:  02/01/2015 and earlier. FINDINGS: Cardiomediastinal silhouette unremarkable, unchanged. Resolution of the mild right middle lobe atelectasis present on the most recent prior examination. Lungs now clear. Bronchovascular markings normal. Pulmonary vascularity normal. No visible pleural effusions. No pneumothorax. Visualized bony thorax intact. IMPRESSION: No acute cardiopulmonary disease. Electronically Signed   By: Evangeline Dakin M.D.   On: 02/27/2015 16:55   I have personally reviewed and evaluated these images and lab results as part of my medical decision-making.   EKG Interpretation None      ED ECG REPORT   Date: 02/27/2015  Rate: 64   Rhythm: normal sinus rhythm  QRS Axis: normal  Intervals: normal  ST/T Wave abnormalities: normal  Conduction Disutrbances:none  Narrative Interpretation:   Old EKG Reviewed: unchanged  I have personally reviewed the EKG tracing and agree with the computerized printout as noted.   MDM   Final diagnoses:  Shortness of breath    35 year old male presents with fatigue and shortness of  breath, which he states has been intermittent since September. Per record review, patient has been evaluated several times in the ED for similar symptoms.  Patient is afebrile. Vital signs stable. O2 sat stable on RA. Heart RRR. Lungs clear bilaterally. Abdomen soft, non-tender, non-distended. No lower extremity edema.  EKG sinus rhythm, HR 64. Troponin negative. CBC, BMP unremarkable. D-dimer negative.  CXR no acute cardiopulmonary disease.  Patient is non-toxic and well-appearing, feel he is stable for discharge at this time. Doubt ACS or PE. Do not feel treatment with antibiotic or steroid course is indicated at this time. Instructed patient to use albuterol inhaler PRN and to follow-up with PCP and with pulmonology in the setting of persistent symptoms. Return precautions discussed. Patient verbalizes his understanding and is in agreement with plan.  BP 105/77 mmHg  Pulse 61  Temp(Src) 98.5 F (36.9 C) (Oral)  Resp 20  SpO2 96%     Marella Chimes, PA-C 02/28/15 0033  Noemi Chapel, MD 02/28/15 1818

## 2015-02-27 NOTE — ED Notes (Signed)
Pt sts cough and SOB; pt sts seen here for same and given an inhaler and steroids which helped; pt sts now back for same

## 2015-02-27 NOTE — ED Notes (Signed)
Patient verbalized understanding of discharge instructions and denies any further needs or questions at this time. VS stable. Patient ambulatory with steady gait.  

## 2015-02-27 NOTE — Discharge Instructions (Signed)
1. Medications: usual home medications 2. Treatment: rest, drink plenty of fluids 3. Follow Up: please followup with your primary doctor for discussion of your diagnoses and further evaluation after today's visit; please follow-up with pulmonology for persistent symptoms; if you do not have a primary care doctor use the resource guide provided to find one; please return to the ER for severe shortness of breath, new or worsening symptoms   Shortness of Breath Shortness of breath means you have trouble breathing. Shortness of breath needs medical care right away. HOME CARE   Do not smoke.  Avoid being around chemicals or things (paint fumes, dust) that may bother your breathing.  Rest as needed. Slowly begin your normal activities.  Only take medicines as told by your doctor.  Keep all doctor visits as told. GET HELP RIGHT AWAY IF:   Your shortness of breath gets worse.  You feel lightheaded, pass out (faint), or have a cough that is not helped by medicine.  You cough up blood.  You have pain with breathing.  You have pain in your chest, arms, shoulders, or belly (abdomen).  You have a fever.  You cannot walk up stairs or exercise the way you normally do.  You do not get better in the time expected.  You have a hard time doing normal activities even with rest.  You have problems with your medicines.  You have any new symptoms. MAKE SURE YOU:  Understand these instructions.  Will watch your condition.  Will get help right away if you are not doing well or get worse.   This information is not intended to replace advice given to you by your health care provider. Make sure you discuss any questions you have with your health care provider.   Document Released: 07/03/2007 Document Revised: 01/19/2013 Document Reviewed: 04/01/2011 Elsevier Interactive Patient Education 2016 [employer].   Emergency Department Resource Guide 1) Find a Doctor and Pay Out of  Pocket Although you won't have to find out who is covered by your insurance plan, it is a good idea to ask around and get recommendations. You will then need to call the office and see if the doctor you have chosen will accept you as a new patient and what types of options they offer for patients who are self-pay. Some doctors offer discounts or will set up payment plans for their patients who do not have insurance, but you will need to ask so you aren't surprised when you get to your appointment.  2) Contact Your Local Health Department Not all health departments have doctors that can see patients for sick visits, but many do, so it is worth a call to see if yours does. If you don't know where your local health department is, you can check in your phone book. The CDC also has a tool to help you locate your state's health department, and many state websites also have listings of all of their local health departments.  3) Find a [facility] If your illness is not likely to be very severe or complicated, you may want to try a walk in clinic. These are popping up all over the country in pharmacies, drugstores, and shopping centers. They're usually staffed by nurse practitioners or physician assistants that have been trained to treat common illnesses and complaints. They're usually fairly quick and inexpensive. However, if you have serious medical issues or chronic medical problems, these are probably not your best option.  No Primary Care Doctor: - Call  Health Connect at  [phone] - they can help you locate a primary care doctor that  accepts your insurance, provides certain services, etc. - Physician Referral Service- [phone]  Chronic Pain Problems: Organization         Address  Phone   Notes  [facility]  [phone] Patients need to be referred by their primary care doctor.   Medication Assistance: Organization         Address  Phone   Notes  [facility]  Medication [facility] [address] [place], [place] [address] [phone] --Must be a resident of [facility] -- Must have NO insurance coverage whatsoever (no Medicaid/ Medicare, etc.) -- The pt. MUST have a primary care doctor that directs their care regularly and follows them in the community   MedAssist  [phone]   [employer]  [phone]    Agencies that provide inexpensive medical care: Organization         Address  Phone   Notes  [place]  [phone]   [facility]    [phone]   [facility] [place], [place] [address] [phone]   [place] [address] [place] [phone]   Planned Parenthood    [phone]   [facility]    [phone]   [place] and [place] [address], [place] Phone:  [phone], Fax:  [phone] Hours of Operation:  9 am - 6 pm, M-F.  Also accepts Medicaid/Medicare and self-pay.  [facility] for [place] [address], [address], [place] Phone: [phone], Fax: [phone]. Hours of Operation:  8:30 am - 5:30 pm, M-F.  Also accepts Medicaid and self-pay.  [facility] [address], [place] Phone: [phone]   [place], [place], [place] [phone], Ext. [phone] Mondays & Thursdays: 7-9 AM.  First 15 patients are seen on a first come, first serve basis.    [place] Providers:  Organization         Address  Phone   Notes  [facility] [address], [place] [phone] Also accepts self-pay patients.  [facility] [address] [place], [place]  [phone]   [place], [address], [place] [phone]   [facility] [address], [place] [phone]   Lucianne Lei [address], [place]   [phone] Only accepts [place] Access [place] patients after they have their name applied to their card.   Self-Pay (no insurance) in [facility]:  Organization         Address  Phone   Notes  Sickle Cell Patients, [facility] [place] [phone]   [facility] [place] [phone]   [facility] [place]  [place], [place], [place] [phone]   [facility]/Dr. Osei-Bonsu  [address], [place] or [address], [place] [phone] Phone number for both [place] and [place] locations is the same.  Urgent Medical and [facility] [address] [place] [phone]   [place]  [address], [place] or [address] [phone]   [facility] [place] [phone], phone; [phone], fax Sees patients 1st and 3rd Saturday of every month.  Must not qualify for public or private insurance (i.e. Medicaid, Medicare, [place] Health Choice, Veterans' Benefits)  Household income should be no more than 200% of the poverty level The clinic cannot treat you if you are pregnant or think you are pregnant  Sexually transmitted diseases are not treated at the clinic.    Dental Care: Organization         Address  Phone  Notes  [facility] [place] [phone] Accepts children up to age 30 who are enrolled in [place] or [place]; pregnant women with a Medicaid card; and children who have applied for Medicaid or [place] Health Choice, but were declined, whose parents can pay a reduced fee at time of service.  [facility]  [address], [place]  [phone] Accepts children up to age 94 who are enrolled in [place] or [place]; pregnant women with a Medicaid card; and children who have applied for Medicaid or [place] Health Choice, but were declined, whose parents can pay a reduced fee at time of service.  [place] Adult Dental Access PROGRAM  [place] [phone] Patients are seen by appointment only. Walk-ins are not accepted. [place] will see patients 58 years of age and older. Monday - Tuesday (8am-5pm) Most Wednesdays (8:30-5pm) $30 per visit, cash only  [facility] Adult Dental Access PROGRAM  [address], [facility] [phone] Patients are seen by appointment only. Walk-ins are not accepted. [place] will see patients 2 years of age and older. One Wednesday Evening (Monthly: Volunteer Based).  $30 per visit, cash only  [place]  [phone] for adults; Children under age 76, call Graduate Pediatric Dentistry at [phone]. Children aged 48-14, please call [phone] to request a pediatric application.  Dental services are provided in all areas of dental care including fillings, crowns and bridges, complete and partial dentures, implants, gum treatment, root canals, and extractions. Preventive care is also provided. Treatment is provided to both adults and children. Patients are selected via a lottery and there is often a waiting list.   [facility] [address] [place]  [phone] [email or web address]   [facility] [address] [place], [place] [phone], Ext. [phone] Second and Fourth Thursday of each month, opens at 6:30 AM; Clinic ends at 9 AM.  Patients are seen on a first-come first-served basis, and a limited number are seen during each clinic.   [facility]  [address] Hillard Danker [place], [place] [phone]   Eligibility Requirements You must have lived in [place], [place], or [place]  counties for at least the last three months.   You cannot be eligible for state or federal sponsored [employer], including [employer], [place], or [employer].   You generally cannot be eligible for healthcare insurance through your employer.    How to apply: Eligibility screenings are held every Tuesday and Wednesday afternoon from 1:00 pm until 4:00 pm. You do not need an appointment for the interview!  [facility] [address] [place], [place]   [place]  [place] Department  [place]  [phone]    Behavioral Health Resources in the Community: Intensive Outpatient Programs Organization         Address  Phone  Notes  [place]. [address] [place], [place] [phone]   [facility]patient [address], [place], [place]   ADS: Alcohol & Drug Svcs [address], [place], [place]   [place] [address],  [place], [place] or [phone]   Substance Abuse Resources Organization         Address  Phone  Notes  Alcohol and Drug Services  [phone]   [place]  [phone]   [facility] [place]   Chinita Pester  [phone]   Residential & Outpatient Substance Abuse Program  [phone]   Psychological Services Organization         Address  Phone  Notes  [facility] [place]  [place]  [phone]   [place] [address], [place] or [phone]    Mobile Crisis Teams Organization         Address  Phone  Notes  [facility]rapeutic Alternatives, Mobile Crisis Care Unit  [phone]   [facility]  [address] [place], [place]   [place] [address], [place] [phone]    Self-Help/Support Groups Organization         Address  Phone             Notes  [place]. of [place] - variety of support groups  [place] Call for more information  Narcotics Anonymous (NA), [facility] [address], [employer] [place]  2 meetings at this location   Special educational needs teacher         Address  Phone  Notes  ASAP Residential Treatment [place],    [place]  [phone]   [facility]  [address] [place] [address], [place], [place]   [place], [place] [phone] Admissions: 8am-3pm M-F  Incentives Substance [place] [address],    [place], [place] [phone]   [facility] [address] [place], [place], [place]   [facility] [address],  [place], [place]   [facility]ight Programs - Intensive Outpatient [address]een [address], [place], [place]   [facility] ([place].,  [place], [place] [phone] or [phone]   [facility]idential Treatment Services (RTS) [address], [place], [place] Accepts Medicaid  [facility] [place] [address]  [place] [phone] Substance Abuse/Addiction Treatment   [facility] Organization         Address  Phone  Notes  [facility]  [phone]   Domenic Schwab, PhD [address] Arlis Porta [place], [place]   [phone] or [phone]   [place], [place] [phone]   [facility] [address] [place], [place] [phone] Insurance/Medicaid/sponsorship through [employer] and Families [address], [facility] [address]  [place], [place] [phone] [place], [place] [phone]    Dr. Adele Schilder  [phone]   [facility] Dept. 1) [address], [place] 2) [place] 3)  [place] [address], [place] [phone]  [phone]   [place] [phone] or [phone] (After Hours)

## 2015-04-05 ENCOUNTER — Encounter (HOSPITAL_COMMUNITY): Payer: Self-pay | Admitting: Family Medicine

## 2015-04-05 ENCOUNTER — Emergency Department (HOSPITAL_COMMUNITY)
Admission: EM | Admit: 2015-04-05 | Discharge: 2015-04-06 | Disposition: A | Discharge status: Home / Self Care | Payer: Self-pay | Attending: Emergency Medicine | Admitting: Emergency Medicine

## 2015-04-05 ENCOUNTER — Emergency Department (HOSPITAL_COMMUNITY): Payer: Self-pay

## 2015-04-05 DIAGNOSIS — F17210 Nicotine dependence, cigarettes, uncomplicated: Secondary | ICD-10-CM | POA: Insufficient documentation

## 2015-04-05 DIAGNOSIS — K921 Melena: Secondary | ICD-10-CM | POA: Insufficient documentation

## 2015-04-05 DIAGNOSIS — R0602 Shortness of breath: Secondary | ICD-10-CM | POA: Insufficient documentation

## 2015-04-05 DIAGNOSIS — Z79899 Other long term (current) drug therapy: Secondary | ICD-10-CM | POA: Insufficient documentation

## 2015-04-05 DIAGNOSIS — R140 Abdominal distension (gaseous): Secondary | ICD-10-CM | POA: Insufficient documentation

## 2015-04-05 DIAGNOSIS — Z8701 Personal history of pneumonia (recurrent): Secondary | ICD-10-CM | POA: Insufficient documentation

## 2015-04-05 DIAGNOSIS — R05 Cough: Secondary | ICD-10-CM | POA: Insufficient documentation

## 2015-04-05 LAB — CBC
HEMATOCRIT: 38.7 % — AB (ref 39.0–52.0)
Hemoglobin: 12.8 g/dL — ABNORMAL LOW (ref 13.0–17.0)
MCH: 28.7 pg (ref 26.0–34.0)
MCHC: 33.1 g/dL (ref 30.0–36.0)
MCV: 86.8 fL (ref 78.0–100.0)
PLATELETS: 336 10*3/uL (ref 150–400)
RBC: 4.46 MIL/uL (ref 4.22–5.81)
RDW: 14.4 % (ref 11.5–15.5)
WBC: 6.2 10*3/uL (ref 4.0–10.5)

## 2015-04-05 LAB — BASIC METABOLIC PANEL
Anion gap: 10 (ref 5–15)
BUN: 8 mg/dL (ref 6–20)
CALCIUM: 9.7 mg/dL (ref 8.9–10.3)
CO2: 27 mmol/L (ref 22–32)
CREATININE: 1.16 mg/dL (ref 0.61–1.24)
Chloride: 104 mmol/L (ref 101–111)
GFR calc Af Amer: >60 mL/min (ref >60)
GLUCOSE: 117 mg/dL — AB (ref 65–99)
Potassium: 3.6 mmol/L (ref 3.5–5.1)
SODIUM: 141 mmol/L (ref 135–145)

## 2015-04-05 LAB — I-STAT TROPONIN, ED
Troponin i, poc: 0 ng/mL (ref 0.00–0.08)
Troponin i, poc: 0.01 ng/mL (ref 0.00–0.08)

## 2015-04-05 LAB — POC OCCULT BLOOD, ED: Fecal Occult Bld: POSITIVE — AB

## 2015-04-05 MED ORDER — SULFASALAZINE 500 MG PO TABS: 500.0000 mg | ORAL_TABLET | Freq: Four times a day (QID) | ORAL | Status: DC

## 2015-04-05 MED ORDER — PREDNISONE 20 MG PO TABS: ORAL_TABLET | ORAL | Status: DC

## 2015-04-05 NOTE — ED Provider Notes (Addendum)
CSN: [id number]     Arrival date & time 04/05/15  1515 History   First MD Initiated Contact with Patient 04/05/15 2138     Chief Complaint  Patient presents with  . Abdominal Pain     (Consider location/radiation/quality/duration/timing/severity/associated sxs/prior Treatment) HPI 35 year old male with a past history of ulcerative colitis presents with rectal bleeding for the last couple days. He states that intermittently he has some rectal bleeding but it has increased over the last 2 days. Has also been having bloating and abdominal cramping. This is mostly present around the time he is going to the bathroom and then seems to get better. Today he felt more bloated than normal and also knows he was short of breath at that time. That has since all resolved while he was in the waiting room. Shortness of breath feels similar to prior dyspneic episodes that he has presented to the ED for. Has had some mild cough. No chest pain. Patient states that he saw a gastroenterologist for his UC several years ago but does not room a name of them. He also has not taken his sulfasalazine in quite some time. He is willing to restart it and follow-up with GI. No fevers or vomiting.  Past Medical History  Diagnosis Date  . Ulcerative colitis ([place])   . Pneumonia    History reviewed. No pertinent past surgical history. History reviewed. No pertinent family history. Social History  Substance Use Topics  . Smoking status: Current Every Day Smoker -- 0.50 packs/day    Types: Cigarettes  . Smokeless tobacco: None  . Alcohol Use: Yes     Comment: socially    Review of Systems  Constitutional: Negative for fever.  Respiratory: Positive for cough and shortness of breath.   Cardiovascular: Negative for chest pain and leg swelling.  Gastrointestinal: Positive for abdominal pain, blood in stool and abdominal distention. Negative for nausea, vomiting and diarrhea.  All other systems reviewed and are  negative.     Allergies  Review of patient's allergies indicates no known allergies.  Home Medications   Prior to Admission medications   Medication Sig Start Date End Date Taking? Authorizing Provider  albuterol (PROVENTIL HFA;VENTOLIN HFA) 108 (90 Base) MCG/ACT inhaler Inhale 1-2 puffs into the lungs every 6 (six) hours as needed for wheezing or shortness of breath. 02/01/15  Yes [place] Lions, PA-C  azithromycin (ZITHROMAX) 250 MG tablet Take 1 tablet (250 mg total) by mouth daily. Take first 2 tablets together, then 1 every day until finished. Patient not taking: Reported on 04/05/2015 02/01/15   [place] Lions, PA-C  doxycycline (VIBRAMYCIN) 100 MG capsule Take 1 capsule (100 mg total) by mouth 2 (two) times daily. One po bid x 10 days Patient not taking: Reported on 04/05/2015 09/16/14   [name], MD  predniSONE (DELTASONE) 20 MG tablet Take 3 tablets (60 mg total) by mouth daily. Patient not taking: Reported on 04/05/2015 02/01/15   [place] Lions, PA-C  sulfaSALAzine (AZULFIDINE) 500 MG tablet Take 2 tablets (1,000 mg total) by mouth 3 (three) times daily. Patient not taking: Reported on 04/05/2015 05/03/11   Earlie Counts, MD   BP 119/74 mmHg  Pulse 63  Temp(Src) 98.2 F (36.8 C) (Oral)  Resp 11  SpO2 96% Physical Exam  Constitutional: He is oriented to person, place, and time. He appears well-developed and well-nourished.  HENT:  Head: Normocephalic and atraumatic.  Right Ear: External ear normal.  Left Ear: External ear normal.  Nose: Nose  normal.  Eyes: Right eye exhibits no discharge. Left eye exhibits no discharge.  Neck: Neck supple.  Cardiovascular: Normal rate, regular rhythm, normal heart sounds and intact distal pulses.   Pulmonary/Chest: Effort normal and breath sounds normal. He has no wheezes. He has no rales.  Abdominal: Soft. There is no tenderness.  Genitourinary: Rectal exam shows no external hemorrhoid, no internal hemorrhoid, no fissure, no  mass and no tenderness. Guaiac negative stool.  Musculoskeletal: He exhibits no edema.  Neurological: He is alert and oriented to person, place, and time.  Skin: Skin is warm and dry.  Nursing note and vitals reviewed.   ED Course  Procedures (including critical care time) Labs Review Labs Reviewed  BASIC METABOLIC PANEL - Abnormal; Notable for the following:    Glucose, Bld 117 (*)    All other components within normal limits  CBC - Abnormal; Notable for the following:    Hemoglobin 12.8 (*)    HCT 38.7 (*)    All other components within normal limits  I-STAT TROPOININ, ED  POC OCCULT BLOOD, ED  Randolm Idol, ED    Imaging Review Dg Chest 2 View  04/05/2015  CLINICAL DATA:  Bloated.  Bloody stools for 2 days EXAM: CHEST  2 VIEW COMPARISON:  02/27/2015 FINDINGS: The heart size and mediastinal contours are within normal limits. Both lungs are clear. The visualized skeletal structures are unremarkable. IMPRESSION: No active cardiopulmonary disease. Electronically Signed   By: Kerby Moors M.D.   On: 04/05/2015 15:54   I have personally reviewed and evaluated these images and lab results as part of my medical decision-making.   EKG Interpretation   Date/Time:  Wednesday April 05 2015 15:30:40 EST Ventricular Rate:  83 PR Interval:  130 QRS Duration: 96 QT Interval:  374 QTC Calculation: 439 R Axis:   63 Text Interpretation:  Normal sinus rhythm Nonspecific T wave abnormality  Abnormal ECG Confirmed by Anshika Pethtel  MD, Maliyah Willets ([id number]) on 04/05/2015 10:08:43  PM       EKG Interpretation  Date/Time:  Wednesday April 05 2015 22:50:24 EST Ventricular Rate:  74 PR Interval:  143 QRS Duration: 90 QT Interval:  403 QTC Calculation: 447 R Axis:   76 Text Interpretation:  Sinus rhythm no acute ST/T changes Confirmed by Oree Hislop  MD, Divina Neale ([id number]) on 04/06/2015 12:16:58 AM       MDM   Final diagnoses:  Hematochezia  Shortness of breath    Patient's rectal exam shows no  gross blood but is heme-positive.  Low suspicion for significant GI bleed. Likely a UC flare. Given normal abdominal exam and no distress or VS abnormality, likely this is on the milder side. D/w GI, Dr. Amedeo Plenty, who recommends prednisone taper and restarting sulfasalazine at 500 mg QID. F/u in his office. Dyspnea is atypical. Low risk for PE. Likely related to smoking. Given acute onset, delta troponin obtained, is low risk by HEART score. Trop is 0.01 as opposed to 0.00 7 hours earlier, not c/w ACS. D/c home with return precautions.    Sherwood Gambler, MD 04/06/15 1594  Sherwood Gambler, MD 04/06/15 [phone]

## 2015-04-05 NOTE — ED Notes (Addendum)
Pt here for abd discomfort, rectal bleeding and SOB. sts became SOB after eating. Pt sts not taking his meds. sts hard to catch breath. Denies N,V,D. Denies cough, fever.

## 2015-04-06 ENCOUNTER — Telehealth: Payer: Self-pay | Admitting: *Deleted

## 2015-04-06 NOTE — Telephone Encounter (Signed)
Pharmacy called related to Rx: predniSONE (DELTASONE) 20 MG tablet 04/05/15 -- Sherwood Gambler, MD 2 tabs po daily x 3 days, then 1.5 tabs x 4 days, then 1 tab x 5 days, then 1 tab x 5 days clarification .Marland KitchenMarland KitchenEDCM clarified with EDP to change Rx last dose to 0.5 tablet x 5 days.

## 2015-05-23 ENCOUNTER — Emergency Department (HOSPITAL_COMMUNITY)
Admission: EM | Admit: 2015-05-23 | Discharge: 2015-05-23 | Disposition: A | Discharge status: Home / Self Care | Payer: Self-pay | Attending: Emergency Medicine | Admitting: Emergency Medicine

## 2015-05-23 ENCOUNTER — Encounter (HOSPITAL_COMMUNITY): Payer: Self-pay | Admitting: Emergency Medicine

## 2015-05-23 DIAGNOSIS — Z760 Encounter for issue of repeat prescription: Secondary | ICD-10-CM | POA: Insufficient documentation

## 2015-05-23 DIAGNOSIS — F17210 Nicotine dependence, cigarettes, uncomplicated: Secondary | ICD-10-CM | POA: Insufficient documentation

## 2015-05-23 DIAGNOSIS — K921 Melena: Secondary | ICD-10-CM | POA: Insufficient documentation

## 2015-05-23 DIAGNOSIS — R197 Diarrhea, unspecified: Secondary | ICD-10-CM | POA: Insufficient documentation

## 2015-05-23 DIAGNOSIS — K5900 Constipation, unspecified: Secondary | ICD-10-CM | POA: Insufficient documentation

## 2015-05-23 DIAGNOSIS — R110 Nausea: Secondary | ICD-10-CM | POA: Insufficient documentation

## 2015-05-23 DIAGNOSIS — Z8701 Personal history of pneumonia (recurrent): Secondary | ICD-10-CM | POA: Insufficient documentation

## 2015-05-23 DIAGNOSIS — Z79899 Other long term (current) drug therapy: Secondary | ICD-10-CM | POA: Insufficient documentation

## 2015-05-23 LAB — I-STAT CHEM 8, ED
BUN: 11 mg/dL (ref 6–20)
CHLORIDE: 103 mmol/L (ref 101–111)
CREATININE: 1 mg/dL (ref 0.61–1.24)
Calcium, Ion: 1.11 mmol/L — ABNORMAL LOW (ref 1.12–1.23)
Glucose, Bld: 83 mg/dL (ref 65–99)
HEMATOCRIT: 47 % (ref 39.0–52.0)
Hemoglobin: 16 g/dL (ref 13.0–17.0)
POTASSIUM: 3.9 mmol/L (ref 3.5–5.1)
SODIUM: 141 mmol/L (ref 135–145)
TCO2: 26 mmol/L (ref 0–100)

## 2015-05-23 LAB — POC OCCULT BLOOD, ED: Fecal Occult Bld: POSITIVE — AB

## 2015-05-23 MED ORDER — SULFASALAZINE 500 MG PO TABS: 500.0000 mg | ORAL_TABLET | Freq: Four times a day (QID) | ORAL | Status: DC

## 2015-05-23 MED ORDER — ONDANSETRON 4 MG PO TBDP
4.0000 mg | ORAL_TABLET | Freq: Once | ORAL | Status: AC
  Administered 2015-05-23: 4 mg via ORAL
  Filled 2015-05-23: qty 1

## 2015-05-23 MED ORDER — PREDNISONE 20 MG PO TABS: ORAL_TABLET | ORAL | Status: DC

## 2015-05-23 NOTE — ED Notes (Signed)
Pt here for continued sx of colitis/wants refill of meds prescribed last month. Has not called GI (Dr. Amedeo Plenty) as instructed last month.

## 2015-05-23 NOTE — ED Provider Notes (Signed)
CSN: [id number]     Arrival date & time 05/23/15  3818 History   First MD Initiated Contact with Patient 05/23/15 1020     Chief Complaint  Patient presents with  . Medication Refill    HPI   Brix Brearley is a 35 y.o. male with a PMH of ulcerative colitis who presents to the ED for refill of his medication (prednisone and sulfasalazine). He states he feels like he is having a flare of his ulcerative colitis, as he has had streaks of blood in his stool for the past week. He notes associated nausea. He denies fever, chills, abdominal pain, vomiting. He states he typically alternates between diarrhea and constipation. He reports his last bowel movement was today prior to arrival and was normal for him. He notes he is supposed to follow-up with GI, but has not been able to do so yet.   Past Medical History  Diagnosis Date  . Ulcerative colitis ([place])   . Pneumonia    History reviewed. No pertinent past surgical history. History reviewed. No pertinent family history. Social History  Substance Use Topics  . Smoking status: Current Every Day Smoker -- 0.50 packs/day    Types: Cigarettes  . Smokeless tobacco: None  . Alcohol Use: No     Review of Systems  Constitutional: Negative for fever and chills.  Gastrointestinal: Positive for nausea, diarrhea, constipation and blood in stool. Negative for vomiting and abdominal pain.  Genitourinary: Negative for dysuria, urgency and frequency.  All other systems reviewed and are negative.     Allergies  Review of patient's allergies indicates no known allergies.  Home Medications   Prior to Admission medications   Medication Sig Start Date End Date Taking? Authorizing Provider  albuterol (PROVENTIL HFA;VENTOLIN HFA) 108 (90 Base) MCG/ACT inhaler Inhale 1-2 puffs into the lungs every 6 (six) hours as needed for wheezing or shortness of breath. 02/01/15   [place] Lions, PA-C  predniSONE (DELTASONE) 20 MG tablet 2 tabs po daily x 3  days, then 1.5 tabs x 4 days, then 1 tab x 5 days, then 1 tab x 5 days 05/23/15   Marella Chimes, PA-C  sulfaSALAzine (AZULFIDINE) 500 MG tablet Take 1 tablet (500 mg total) by mouth 4 (four) times daily. 05/23/15   Marella Chimes, PA-C    BP 128/87 mmHg  Pulse 71  Temp(Src) 97.7 F (36.5 C) (Oral)  Resp 14  Ht 5' 10"  (1.778 m)  Wt 83.915 kg  BMI 26.54 kg/m2  SpO2 98% Physical Exam  Constitutional: He is oriented to person, place, and time. He appears well-developed and well-nourished. No distress.  HENT:  Head: Normocephalic and atraumatic.  Right Ear: External ear normal.  Left Ear: External ear normal.  Nose: Nose normal.  Mouth/Throat: Uvula is midline, oropharynx is clear and moist and mucous membranes are normal.  Eyes: Conjunctivae, EOM and lids are normal. Pupils are equal, round, and reactive to light. Right eye exhibits no discharge. Left eye exhibits no discharge. No scleral icterus.  Neck: Normal range of motion. Neck supple.  Cardiovascular: Normal rate, regular rhythm, normal heart sounds, intact distal pulses and normal pulses.   Pulmonary/Chest: Effort normal and breath sounds normal. No respiratory distress. He has no wheezes. He has no rales.  Abdominal: Soft. Normal appearance and bowel sounds are normal. He exhibits no distension and no mass. There is no tenderness. There is no rigidity, no rebound and no guarding.  Musculoskeletal: Normal range of motion. He exhibits  no edema or tenderness.  Neurological: He is alert and oriented to person, place, and time.  Skin: Skin is warm, dry and intact. No rash noted. He is not diaphoretic. No erythema. No pallor.  Psychiatric: He has a normal mood and affect. His speech is normal and behavior is normal.  Nursing note and vitals reviewed.   ED Course  Procedures (including critical care time)  Labs Review Labs Reviewed  I-STAT CHEM 8, ED - Abnormal; Notable for the following:    Calcium, Ion 1.11 (*)     All other components within normal limits  POC OCCULT BLOOD, ED - Abnormal; Notable for the following:    Fecal Occult Bld POSITIVE (*)    All other components within normal limits    Imaging Review No results found.   I have personally reviewed and evaluated these lab results as part of my medical decision-making.   EKG Interpretation None      MDM   Final diagnoses:  Medication refill  Blood in stool    35 year old male presents for refill of prednisone and sulfasalazine. Also states he thinks he is having a flare of his UC as he has noticed streaks of blood in his stool x 1 week. Notes nausea. Reports diarrhea and constipation (alternates between the 2 at baseline), though states he had a normal bowel movement this morning. Denies fever, chills, abdominal pain, vomiting. Patient is afebrile. Vital signs stable. Abdomen soft, non-tender, non-distended. No rebound, guarding, or masses. No gross blood in rectal vault. Hemoccult positive. Hemoglobin stable. Patient is nontoxic and well-appearing, feel he is stable for discharge at this time. Will refill prednisone and sulfasalazine. Patient to follow-up with GI for further evaluation and management. Strict return precautions discussed. Patient verbalizes his understanding and is in agreement with plan.  BP 128/87 mmHg  Pulse 71  Temp(Src) 97.7 F (36.5 C) (Oral)  Resp 14  Ht 5' 10"  (1.778 m)  Wt 83.915 kg  BMI 26.54 kg/m2  SpO2 98%     Marella Chimes, PA-C 05/23/15 1343  Virgel Manifold, MD 05/31/15 1314

## 2015-05-23 NOTE — ED Notes (Signed)
Pt states he has ulcerative colitis and has had a flare up for about two weeks. Pt states he is here just for a refill of his medications.

## 2015-05-23 NOTE — Discharge Instructions (Signed)
1. Medications: usual home medications 2. Treatment: rest, drink plenty of fluids 3. Follow Up: please followup with your primary doctor and with GI for discussion of your diagnoses and further evaluation after today's visit; if you do not have a primary care doctor use the phone number listed in your discharge paperwork to find one; please return to the ER for severe abdominal pain, persistent bloody stools, new or worsening symptoms   Medicine Refill at the Emergency Department We have refilled your medicine today, but it is best for you to get refills through your primary health care provider's office. In the future, please plan ahead so you do not need to get refills from the emergency department. If the medicine we refilled was a maintenance medicine, you may have received only enough to get you by until you are able to see your regular health care provider.   This information is not intended to replace advice given to you by your health care provider. Make sure you discuss any questions you have with your health care provider.   Document Released: 05/03/2003 Document Revised: 02/04/2014 Document Reviewed: 04/23/2013 Elsevier Interactive Patient Education [employer].

## 2015-06-23 ENCOUNTER — Encounter (HOSPITAL_COMMUNITY): Payer: Self-pay | Admitting: Emergency Medicine

## 2015-06-23 ENCOUNTER — Emergency Department (HOSPITAL_COMMUNITY)
Admission: EM | Admit: 2015-06-23 | Discharge: 2015-06-23 | Disposition: A | Discharge status: Home / Self Care | Payer: Self-pay | Attending: Emergency Medicine | Admitting: Emergency Medicine

## 2015-06-23 DIAGNOSIS — R1084 Generalized abdominal pain: Secondary | ICD-10-CM | POA: Insufficient documentation

## 2015-06-23 DIAGNOSIS — Z8701 Personal history of pneumonia (recurrent): Secondary | ICD-10-CM | POA: Insufficient documentation

## 2015-06-23 DIAGNOSIS — Z79899 Other long term (current) drug therapy: Secondary | ICD-10-CM | POA: Insufficient documentation

## 2015-06-23 DIAGNOSIS — Z8719 Personal history of other diseases of the digestive system: Secondary | ICD-10-CM | POA: Insufficient documentation

## 2015-06-23 DIAGNOSIS — F17210 Nicotine dependence, cigarettes, uncomplicated: Secondary | ICD-10-CM | POA: Insufficient documentation

## 2015-06-23 LAB — URINALYSIS, ROUTINE W REFLEX MICROSCOPIC
Bilirubin Urine: NEGATIVE
Glucose, UA: NEGATIVE mg/dL
Ketones, ur: 15 mg/dL — AB
LEUKOCYTES UA: NEGATIVE
NITRITE: NEGATIVE
PH: 5.5 (ref 5.0–8.0)
Protein, ur: NEGATIVE mg/dL
SPECIFIC GRAVITY, URINE: 1.033 — AB (ref 1.005–1.030)

## 2015-06-23 LAB — COMPREHENSIVE METABOLIC PANEL
ALBUMIN: 3.8 g/dL (ref 3.5–5.0)
ALT: 19 U/L (ref 17–63)
ANION GAP: 7 (ref 5–15)
AST: 26 U/L (ref 15–41)
Alkaline Phosphatase: 72 U/L (ref 38–126)
BILIRUBIN TOTAL: 0.4 mg/dL (ref 0.3–1.2)
BUN: 10 mg/dL (ref 6–20)
CO2: 27 mmol/L (ref 22–32)
Calcium: 9.5 mg/dL (ref 8.9–10.3)
Chloride: 106 mmol/L (ref 101–111)
Creatinine, Ser: 1.02 mg/dL (ref 0.61–1.24)
GFR calc Af Amer: >60 mL/min (ref >60)
Glucose, Bld: 110 mg/dL — ABNORMAL HIGH (ref 65–99)
POTASSIUM: 3.5 mmol/L (ref 3.5–5.1)
Sodium: 140 mmol/L (ref 135–145)
TOTAL PROTEIN: 7.1 g/dL (ref 6.5–8.1)

## 2015-06-23 LAB — CBC
HEMATOCRIT: 39.6 % (ref 39.0–52.0)
HEMOGLOBIN: 12.7 g/dL — AB (ref 13.0–17.0)
MCH: 27.9 pg (ref 26.0–34.0)
MCHC: 32.1 g/dL (ref 30.0–36.0)
MCV: 87 fL (ref 78.0–100.0)
Platelets: 298 10*3/uL (ref 150–400)
RBC: 4.55 MIL/uL (ref 4.22–5.81)
RDW: 14.7 % (ref 11.5–15.5)
WBC: 6 10*3/uL (ref 4.0–10.5)

## 2015-06-23 LAB — URINE MICROSCOPIC-ADD ON

## 2015-06-23 LAB — LIPASE, BLOOD: LIPASE: 38 U/L (ref 11–51)

## 2015-06-23 MED ORDER — SULFASALAZINE 500 MG PO TABS: 500.0000 mg | ORAL_TABLET | Freq: Four times a day (QID) | ORAL | Status: DC

## 2015-06-23 MED ORDER — DEXAMETHASONE SODIUM PHOSPHATE 10 MG/ML IJ SOLN
10.0000 mg | Freq: Once | INTRAMUSCULAR | Status: AC
  Administered 2015-06-23: 10 mg via INTRAMUSCULAR
  Filled 2015-06-23: qty 1

## 2015-06-23 NOTE — ED Notes (Signed)
Tatyana PA in room with this RN for GC/Chlamydia prope amp collection.

## 2015-06-23 NOTE — Discharge Instructions (Signed)
Restart sulfasalazine. Take Tylenol for pain. Make sure to follow-up with your doctor next week as scheduled. Return if worsening symptoms or pain.  Ulcerative Colitis, Adult Ulcerative colitis is long-lasting (chronic) swelling (inflammation) of the large intestine (colon). Sores (ulcers) may also form on the colon.  Ulcerative colitis is closely related to another condition of inflammation of the intestines that is called Crohn disease. Together, they are frequently referred to as inflammatory bowel disease (IBD).  CAUSES  Ulcerative colitis is caused by increased activity of the immune system in the intestines. The immune system is the system that protects the body against harmful bacteria, viruses, fungi, and other things that can make you sick. When the immune system overacts, it causes inflammation. The cause of the increased immune system activity is not known.  RISK FACTORS Risk factors of ulcerative colitis include:   Age. This includes:  Being 70-51 years old.  Being older than 37 years old.  Having a family history of ulcerative colitis.   Being of Jewish descent. SIGNS AND SYMPTOMS  Common symptoms of ulcerative colitis include rectal bleeding and diarrhea. There is a wide range of symptoms, and a person's symptoms depend on how severe the condition is. Additional symptoms may include:   Pain or cramping in the belly (abdomen).   Fever.   Fatigue.   Weight loss.   Night sweats.   Rectal pain.   Feeling the immediate need to have a bowel movement.   Nausea.  Loss of appetite.  Anemia.  Joint pain or soreness.  Eye irritation.  Certain skin rashes. DIAGNOSIS  Ulcerative colitis may be diagnosed by:  Medical history and physical exam.  Blood tests and stool tests.  X-rays.  CT scans.  Colonoscopy. For this test, a flexible tube is inserted into your anus and your colon is examined.  Examination of a tissue sample from your colon  (biopsy). TREATMENT  Treatment for ulcerative colitis may include medicines to:  Decrease inflammation.  Control your immune system. Surgery may also be necessary.  HOME CARE INSTRUCTIONS  Medicines and Vitamins  Take medicines only as directed by your doctor. Do not take aspirin.  Ask your doctor if you should take any vitamins or supplements. Lifestyle  Exercise regularly.  Limit alcohol intake to no more than 1 drink per day for nonpregnant women and 2 drinks per day for men. One drink equals 12 ounces of beer, 5 ounces of wine, or 1 ounces of hard liquor. Eating and Drinking  Drink enough fluid to keep your urine clear or pale yellow.  Ask your health care provider about the best diet for you. Follow the diet as directed by your health care provider. This may include:  Avoiding carbonated drinks.  Avoiding popcorn, vegetable skins, nuts, and other high-fiber foods when you have symptoms of ulcerative colitis.  Eating smaller meals more often.  Keeping a food diary. This may help you to find and avoid any foods that make you feel not well.  Limit your caffeine intake. General Instructions  Keep all follow-up appointments as directed by your health care provider. This is important. SEEK MEDICAL CARE IF:   Your symptoms do not improve or get worse with treatment.   You continue to lose weight.  You have constant cramps or loose bowels.   You develop a new skin rash, skin sores, or eye problems.  You have a fever or chills. SEEK IMMEDIATE MEDICAL CARE IF:   You have bloody diarrhea.   You have  severe pain in your abdomen.   You vomit.   This information is not intended to replace advice given to you by your health care provider. Make sure you discuss any questions you have with your health care provider.   Document Released: 10/24/2004 Document Revised: 10/05/2014 Document Reviewed: 09/01/2013 Elsevier Interactive Patient Education [employer].

## 2015-06-23 NOTE — ED Provider Notes (Signed)
CSN: [id number]     Arrival date & time 06/23/15  1545 History  By signing my name below, I, [name], attest that this documentation has been prepared under the direction and in the presence of Marlie Kuennen, PA-C. Electronically Signed: [name], ED Scribe. 06/23/2015. 5:07 PM.   Chief Complaint  Patient presents with  . Abdominal Pain   The history is provided by the patient. No language interpreter was used.  HPI Comments: Jerome Irwin is a 35 y.o. male with a hx of ulcerative colitis who presents to the Emergency Department complaining of gradually worsening abdominal pain onset one week ago. Pt reports associated watery diarrhea and mild blood in stool. He states that he has a hx of bleeding ulcer and believes it is "acting up." Pt is on sulfasalazine but has since run out and has not been able to see a gastroenterologist because he just got his orange card. He would also like to be tested for STDs. He denies fever, chills, nausea, vomiting, penile discharge, scrotal pain, or dysuria.   Past Medical History  Diagnosis Date  . Ulcerative colitis ([place])   . Pneumonia    History reviewed. No pertinent past surgical history. History reviewed. No pertinent family history. Social History  Substance Use Topics  . Smoking status: Current Every Day Smoker -- 0.50 packs/day    Types: Cigarettes  . Smokeless tobacco: None  . Alcohol Use: No    Review of Systems  Constitutional: Negative for fever and chills.  Gastrointestinal: Positive for abdominal pain, diarrhea and blood in stool. Negative for nausea and vomiting.  Genitourinary: Negative for dysuria and discharge.       Scrotal pain  All other systems reviewed and are negative.  Allergies  Review of patient's allergies indicates no known allergies.  Home Medications   Prior to Admission medications   Medication Sig Start Date End Date Taking? Authorizing Provider  albuterol (PROVENTIL HFA;VENTOLIN HFA) 108  (90 Base) MCG/ACT inhaler Inhale 1-2 puffs into the lungs every 6 (six) hours as needed for wheezing or shortness of breath. 02/01/15   [place] Lions, PA-C  predniSONE (DELTASONE) 20 MG tablet 2 tabs po daily x 3 days, then 1.5 tabs x 4 days, then 1 tab x 5 days, then 1 tab x 5 days 05/23/15   Marella Chimes, PA-C  sulfaSALAzine (AZULFIDINE) 500 MG tablet Take 1 tablet (500 mg total) by mouth 4 (four) times daily. 05/23/15   Marella Chimes, PA-C   BP 121/74 mmHg  Pulse 86  Temp(Src) 98 F (36.7 C) (Oral)  Resp 14  SpO2 97% Physical Exam  Constitutional: He is oriented to person, place, and time. He appears well-developed and well-nourished.  HENT:  Head: Normocephalic and atraumatic.  Eyes: EOM are normal.  Neck: Normal range of motion. Neck supple.  Cardiovascular: Normal rate, regular rhythm and normal heart sounds.   Pulmonary/Chest: Effort normal and breath sounds normal. No respiratory distress. He has no wheezes. He has no rales.  Abdominal: Soft. Bowel sounds are normal. He exhibits no distension. There is no tenderness. There is no rebound and no guarding.  Genitourinary: Penis normal.  Musculoskeletal: Normal range of motion.  Neurological: He is alert and oriented to person, place, and time.  Skin: Skin is warm and dry.  Psychiatric: He has a normal mood and affect. His behavior is normal.  Nursing note and vitals reviewed.   ED Course  Procedures (including critical care time) DIAGNOSTIC STUDIES: Oxygen Saturation is  97% on RA, normal by my interpretation.    COORDINATION OF CARE: 5:05 PM-Discussed treatment plan which includes labs with pt at bedside and pt agreed to plan.    Labs Review Labs Reviewed  COMPREHENSIVE METABOLIC PANEL - Abnormal; Notable for the following:    Glucose, Bld 110 (*)    All other components within normal limits  CBC - Abnormal; Notable for the following:    Hemoglobin 12.7 (*)    All other components within normal  limits  URINALYSIS, ROUTINE W REFLEX MICROSCOPIC (NOT AT [facility]) - Abnormal; Notable for the following:    Specific Gravity, Urine 1.033 (*)    Hgb urine dipstick MODERATE (*)    Ketones, ur 15 (*)    All other components within normal limits  URINE MICROSCOPIC-ADD ON - Abnormal; Notable for the following:    Squamous Epithelial / LPF 0-5 (*)    Bacteria, UA FEW (*)    All other components within normal limits  LIPASE, BLOOD  GC/CHLAMYDIA PROBE AMP ([place]) NOT AT [facility]    Imaging Review No results found. I have personally reviewed and evaluated these images and lab results as part of my medical decision-making.   EKG Interpretation None      MDM   Final diagnoses:  Generalized abdominal pain    Patient emergency department with possible ulcerative colitis flare. He reports abdominal discomfort. He states pain is not severe, comes and goes. He states the main reason he came in is to get his medications refilled. He has not followed up with gastroenterology, and he has been seen last month in the month before for the same. Has an appointment with her family doctor next week. We'll check lab work, urinalysis. Patient is also requesting STD check.  6:04 PM Labs unremarkable. VS normal. No fever. Abdomen not tender.  I do not think any further imaging or workup necessary at this time. Discussed with Dr.Nguyen, will restart sulfasalazine. I will hold off on steroids. I will give patient a steroid injection here once. He does have an appointment next week. He will follow-up with a family doctor. Return precautions discussed.  Filed Vitals:   06/23/15 1552  BP: 121/74  Pulse: 86  Temp: 98 F (36.7 C)  Resp: 14    I personally performed the services described in this documentation, which was scribed in my presence. The recorded information has been reviewed and is accurate.   Jeannett Senior, PA-C 06/23/15 2017  Harvel Quale, MD 06/25/15 2028

## 2015-06-23 NOTE — ED Notes (Signed)
Pt here with abdominal pain since 1 week ago. Pt with hx of bleeding ulcer in 2003, sts he think it is :flaring up." Pt also has concern for STD and would like to be tested. Pt denies n/v but reports diarrhea. Pt also reports blood in stool.

## 2015-06-27 LAB — GC/CHLAMYDIA PROBE AMP (~~LOC~~) NOT AT ARMC
CHLAMYDIA, DNA PROBE: NEGATIVE
NEISSERIA GONORRHEA: NEGATIVE

## 2015-07-14 ENCOUNTER — Encounter (HOSPITAL_COMMUNITY): Payer: Self-pay

## 2015-07-14 ENCOUNTER — Emergency Department (HOSPITAL_COMMUNITY)
Admission: EM | Admit: 2015-07-14 | Discharge: 2015-07-14 | Disposition: A | Discharge status: Home / Self Care | Payer: Self-pay | Attending: Emergency Medicine | Admitting: Emergency Medicine

## 2015-07-14 ENCOUNTER — Emergency Department (HOSPITAL_COMMUNITY): Payer: Self-pay

## 2015-07-14 DIAGNOSIS — Z7952 Long term (current) use of systemic steroids: Secondary | ICD-10-CM | POA: Insufficient documentation

## 2015-07-14 DIAGNOSIS — Z79899 Other long term (current) drug therapy: Secondary | ICD-10-CM | POA: Insufficient documentation

## 2015-07-14 DIAGNOSIS — F17210 Nicotine dependence, cigarettes, uncomplicated: Secondary | ICD-10-CM | POA: Insufficient documentation

## 2015-07-14 DIAGNOSIS — J209 Acute bronchitis, unspecified: Secondary | ICD-10-CM | POA: Insufficient documentation

## 2015-07-14 DIAGNOSIS — Z720 Tobacco use: Secondary | ICD-10-CM

## 2015-07-14 MED ORDER — PREDNISONE 20 MG PO TABS: 20.0000 mg | ORAL_TABLET | Freq: Two times a day (BID) | ORAL | Status: DC

## 2015-07-14 MED ORDER — ALBUTEROL SULFATE HFA 108 (90 BASE) MCG/ACT IN AERS
2.0000 | INHALATION_SPRAY | RESPIRATORY_TRACT | Status: DC | PRN
  Administered 2015-07-14: 2 via RESPIRATORY_TRACT
  Filled 2015-07-14: qty 6.7

## 2015-07-14 MED ORDER — PREDNISONE 20 MG PO TABS
60.0000 mg | ORAL_TABLET | Freq: Once | ORAL | Status: AC
  Administered 2015-07-14: 60 mg via ORAL
  Filled 2015-07-14: qty 3

## 2015-07-14 NOTE — ED Notes (Signed)
Pt was seen in the past with URI and SOB. Pt reports he was sent home with inhaler with no change in the SOB. Worsened for about a week. Denies congestion and cough.

## 2015-07-14 NOTE — Discharge Instructions (Signed)
Use the inhaler 2 puffs every 3-4 hours as needed for cough or trouble breathing. Try to stop smoking.   Acute Bronchitis Bronchitis is inflammation of the airways that extend from the windpipe into the lungs (bronchi). The inflammation often causes mucus to develop. This leads to a cough, which is the most common symptom of bronchitis.  In acute bronchitis, the condition usually develops suddenly and goes away over time, usually in a couple weeks. Smoking, allergies, and asthma can make bronchitis worse. Repeated episodes of bronchitis may cause further lung problems.  CAUSES Acute bronchitis is most often caused by the same virus that causes a cold. The virus can spread from person to person (contagious) through coughing, sneezing, and touching contaminated objects. SIGNS AND SYMPTOMS   Cough.   Fever.   Coughing up mucus.   Body aches.   Chest congestion.   Chills.   Shortness of breath.   Sore throat.  DIAGNOSIS  Acute bronchitis is usually diagnosed through a physical exam. Your health care provider will also ask you questions about your medical history. Tests, such as chest X-rays, are sometimes done to rule out other conditions.  TREATMENT  Acute bronchitis usually goes away in a couple weeks. Oftentimes, no medical treatment is necessary. Medicines are sometimes given for relief of fever or cough. Antibiotic medicines are usually not needed but may be prescribed in certain situations. In some cases, an inhaler may be recommended to help reduce shortness of breath and control the cough. A cool mist vaporizer may also be used to help thin bronchial secretions and make it easier to clear the chest.  HOME CARE INSTRUCTIONS  Get plenty of rest.   Drink enough fluids to keep your urine clear or pale yellow (unless you have a medical condition that requires fluid restriction). Increasing fluids may help thin your respiratory secretions (sputum) and reduce chest congestion,  and it will prevent dehydration.   Take medicines only as directed by your health care provider.  If you were prescribed an antibiotic medicine, finish it all even if you start to feel better.  Avoid smoking and secondhand smoke. Exposure to cigarette smoke or irritating chemicals will make bronchitis worse. If you are a smoker, consider using nicotine gum or skin patches to help control withdrawal symptoms. Quitting smoking will help your lungs heal faster.   Reduce the chances of another bout of acute bronchitis by washing your hands frequently, avoiding people with cold symptoms, and trying not to touch your hands to your mouth, nose, or eyes.   Keep all follow-up visits as directed by your health care provider.  SEEK MEDICAL CARE IF: Your symptoms do not improve after 1 week of treatment.  SEEK IMMEDIATE MEDICAL CARE IF:  You develop an increased fever or chills.   You have chest pain.   You have severe shortness of breath.  You have bloody sputum.   You develop dehydration.  You faint or repeatedly feel like you are going to pass out.  You develop repeated vomiting.  You develop a severe headache. MAKE SURE YOU:   Understand these instructions.  Will watch your condition.  Will get help right away if you are not doing well or get worse.   This information is not intended to replace advice given to you by your health care provider. Make sure you discuss any questions you have with your health care provider.   Document Released: 02/22/2004 Document Revised: 02/04/2014 Document Reviewed: 07/07/2012 Elsevier Interactive Patient Education 2016  [place].  Smoking Cessation, Tips for Success If you are ready to quit smoking, congratulations! You have chosen to help yourself be healthier. Cigarettes bring nicotine, tar, carbon monoxide, and other irritants into your body. Your lungs, heart, and blood vessels will be able to work better without these poisons. There  are many different ways to quit smoking. Nicotine gum, nicotine patches, a nicotine inhaler, or nicotine nasal spray can help with physical craving. Hypnosis, support groups, and medicines help break the habit of smoking. WHAT THINGS CAN I DO TO MAKE QUITTING EASIER?  Here are some tips to help you quit for good:  Pick a date when you will quit smoking completely. Tell all of your friends and family about your plan to quit on that date.  Do not try to slowly cut down on the number of cigarettes you are smoking. Pick a quit date and quit smoking completely starting on that day.  Throw away all cigarettes.   Clean and remove all ashtrays from your home, work, and car.  On a card, write down your reasons for quitting. Carry the card with you and read it when you get the urge to smoke.  Cleanse your body of nicotine. Drink enough water and fluids to keep your urine clear or pale yellow. Do this after quitting to flush the nicotine from your body.  Learn to predict your moods. Do not let a bad situation be your excuse to have a cigarette. Some situations in your life might tempt you into wanting a cigarette.  Never have "just one" cigarette. It leads to wanting another and another. Remind yourself of your decision to quit.  Change habits associated with smoking. If you smoked while driving or when feeling stressed, try other activities to replace smoking. Stand up when drinking your coffee. Brush your teeth after eating. Sit in a different chair when you read the paper. Avoid alcohol while trying to quit, and try to drink fewer caffeinated beverages. Alcohol and caffeine may urge you to smoke.  Avoid foods and drinks that can trigger a desire to smoke, such as sugary or spicy foods and alcohol.  Ask people who smoke not to smoke around you.  Have something planned to do right after eating or having a cup of coffee. For example, plan to take a walk or exercise.  Try a relaxation exercise to  calm you down and decrease your stress. Remember, you may be tense and nervous for the first 2 weeks after you quit, but this will pass.  Find new activities to keep your hands busy. Play with a pen, coin, or rubber band. Doodle or draw things on paper.  Brush your teeth right after eating. This will help cut down on the craving for the taste of tobacco after meals. You can also try mouthwash.   Use oral substitutes in place of cigarettes. Try using lemon drops, carrots, cinnamon sticks, or chewing gum. Keep them handy so they are available when you have the urge to smoke.  When you have the urge to smoke, try deep breathing.  Designate your home as a nonsmoking area.  If you are a heavy smoker, ask your health care provider about a prescription for nicotine chewing gum. It can ease your withdrawal from nicotine.  Reward yourself. Set aside the cigarette money you save and buy yourself something nice.  Look for support from others. Join a support group or smoking cessation program. Ask someone at home or at work to help  you with your plan to quit smoking.  Always ask yourself, "Do I need this cigarette or is this just a reflex?" Tell yourself, "Today, I choose not to smoke," or "I do not want to smoke." You are reminding yourself of your decision to quit.  Do not replace cigarette smoking with electronic cigarettes (commonly called e-cigarettes). The safety of e-cigarettes is unknown, and some may contain harmful chemicals.  If you relapse, do not give up! Plan ahead and think about what you will do the next time you get the urge to smoke. HOW WILL I FEEL WHEN I QUIT SMOKING? You may have symptoms of withdrawal because your body is used to nicotine (the addictive substance in cigarettes). You may crave cigarettes, be irritable, feel very hungry, cough often, get headaches, or have difficulty concentrating. The withdrawal symptoms are only temporary. They are strongest when you first quit but  will go away within 10-14 days. When withdrawal symptoms occur, stay in control. Think about your reasons for quitting. Remind yourself that these are signs that your body is healing and getting used to being without cigarettes. Remember that withdrawal symptoms are easier to treat than the major diseases that smoking can cause.  Even after the withdrawal is over, expect periodic urges to smoke. However, these cravings are generally short lived and will go away whether you smoke or not. Do not smoke! WHAT RESOURCES ARE AVAILABLE TO HELP ME QUIT SMOKING? Your health care provider can direct you to community resources or hospitals for support, which may include:  Group support.  Education.  Hypnosis.  Therapy.   This information is not intended to replace advice given to you by your health care provider. Make sure you discuss any questions you have with your health care provider.   Document Released: 10/13/2003 Document Revised: 02/04/2014 Document Reviewed: 07/02/2012 Elsevier Interactive Patient Education 2016 [employer].  Smoking Hazards Smoking cigarettes is extremely bad for your health. Tobacco smoke has over 200 known poisons in it. It contains the poisonous gases nitrogen oxide and carbon monoxide. There are over 60 chemicals in tobacco smoke that cause cancer. Some of the chemicals found in cigarette smoke include:   Cyanide.   Benzene.   Formaldehyde.   Methanol (wood alcohol).   Acetylene (fuel used in welding torches).   Ammonia.  Even smoking lightly shortens your life expectancy by several years. You can greatly reduce the risk of medical problems for you and your family by stopping now. Smoking is the most preventable cause of death and disease in our society. Within days of quitting smoking, your circulation improves, you decrease the risk of having a heart attack, and your lung capacity improves. There may be some increased phlegm in the first few days after  quitting, and it may take months for your lungs to clear up completely. Quitting for 10 years reduces your risk of developing lung cancer to almost that of a nonsmoker.  WHAT ARE THE RISKS OF SMOKING? Cigarette smokers have an increased risk of many serious medical problems, including:  Lung cancer.   Lung disease (such as pneumonia, bronchitis, and emphysema).   Heart attack and chest pain due to the heart not getting enough oxygen (angina).   Heart disease and peripheral blood vessel disease.   Hypertension.   Stroke.   Oral cancer (cancer of the lip, mouth, or voice box).   Bladder cancer.   Pancreatic cancer.   Cervical cancer.   Pregnancy complications, including premature birth.   Stillbirths  and smaller newborn babies, birth defects, and genetic damage to sperm.   Early menopause.   Lower estrogen level for women.   Infertility.   Facial wrinkles.   Blindness.   Increased risk of broken bones (fractures).   Senile dementia.   Stomach ulcers and internal bleeding.   Delayed wound healing and increased risk of complications during surgery. Because of secondhand smoke exposure, children of smokers have an increased risk of the following:   Sudden infant death syndrome (SIDS).   Respiratory infections.   Lung cancer.   Heart disease.   Ear infections.  WHY IS SMOKING ADDICTIVE? Nicotine is the chemical agent in tobacco that is capable of causing addiction or dependence. When you smoke and inhale, nicotine is absorbed rapidly into the bloodstream through your lungs. Both inhaled and noninhaled nicotine may be addictive.  WHAT ARE THE BENEFITS OF QUITTING?  There are many health benefits to quitting smoking. Some are:   The likelihood of developing cancer and heart disease decreases. Health improvements are seen almost immediately.   Blood pressure, pulse rate, and breathing patterns start returning to normal soon after  quitting.   People who quit may see an improvement in their overall quality of life.  HOW DO YOU QUIT SMOKING? Smoking is an addiction with both physical and psychological effects, and longtime habits can be hard to change. Your health care provider can recommend:  Programs and community resources, which may include group support, education, or therapy.  Replacement products, such as patches, gum, and nasal sprays. Use these products only as directed. Do not replace cigarette smoking with electronic cigarettes (commonly called e-cigarettes). The safety of e-cigarettes is unknown, and some may contain harmful chemicals. FOR MORE INFORMATION  American Lung Association: www.lung.org  American Cancer Society: www.cancer.org   This information is not intended to replace advice given to you by your health care provider. Make sure you discuss any questions you have with your health care provider.   Document Released: 02/22/2004 Document Revised: 11/04/2012 Document Reviewed: 07/06/2012 Elsevier Interactive Patient Education [employer].

## 2015-07-14 NOTE — ED Notes (Signed)
Pt is in stable condition upon d/c and ambulates from ED. 

## 2015-07-14 NOTE — ED Provider Notes (Signed)
CSN: [id number]     Arrival date & time 07/14/15  1306 History   First MD Initiated Contact with Patient 07/14/15 1644     Chief Complaint  Patient presents with  . Shortness of Breath     (Consider location/radiation/quality/duration/timing/severity/associated sxs/prior Treatment) HPI   Jerome Irwin is a 35 y.o. male who presents for evaluation of shortness of breath noticed with exertion, walking or having sex, for several days. He uses an inhaler, which was prescribed at a clinic, several weeks ago. He continues to smoke. He denies cough, fever, chills, nausea, vomiting, weakness or dizziness. There are no other known modifying factors.   Past Medical History  Diagnosis Date  . Ulcerative colitis ([place])   . Pneumonia    History reviewed. No pertinent past surgical history. History reviewed. No pertinent family history. Social History  Substance Use Topics  . Smoking status: Current Every Day Smoker -- 0.50 packs/day    Types: Cigarettes  . Smokeless tobacco: None  . Alcohol Use: No    Review of Systems  All other systems reviewed and are negative.     Allergies  Review of patient's allergies indicates no known allergies.  Home Medications   Prior to Admission medications   Medication Sig Start Date End Date Taking? Authorizing Provider  albuterol (PROVENTIL HFA;VENTOLIN HFA) 108 (90 Base) MCG/ACT inhaler Inhale 1-2 puffs into the lungs every 6 (six) hours as needed for wheezing or shortness of breath. 02/01/15  Yes [place], PA-C  predniSONE (DELTASONE) 20 MG tablet Take 1 tablet (20 mg total) by mouth 2 (two) times daily. 07/14/15   Daleen Bo, MD  sulfaSALAzine (AZULFIDINE) 500 MG tablet Take 1 tablet (500 mg total) by mouth 4 (four) times daily. Patient not taking: Reported on 07/14/2015 06/23/15   Tatyana Kirichenko, PA-C   BP 116/71 mmHg  Pulse 63  Temp(Src) 98 F (36.7 C) (Oral)  Resp 16  SpO2 100% Physical Exam  Constitutional: He is  oriented to person, place, and time. He appears well-developed and well-nourished. No distress.  HENT:  Head: Normocephalic and atraumatic.  Right Ear: External ear normal.  Left Ear: External ear normal.  Eyes: Conjunctivae and EOM are normal. Pupils are equal, round, and reactive to light.  Neck: Normal range of motion and phonation normal. Neck supple.  Cardiovascular: Normal rate, regular rhythm and normal heart sounds.   Pulmonary/Chest: Effort normal. No respiratory distress. He has wheezes (Few, scattered). He has no rales. He exhibits no tenderness and no bony tenderness.  Somewhat decreased breath sounds right base.  Abdominal: Soft. There is no tenderness.  Musculoskeletal: Normal range of motion.  Neurological: He is alert and oriented to person, place, and time. No cranial nerve deficit or sensory deficit. He exhibits normal muscle tone. Coordination normal.  Skin: Skin is warm, dry and intact.  Psychiatric: He has a normal mood and affect. His behavior is normal. Judgment and thought content normal.  Nursing note and vitals reviewed.   ED Course  Procedures (including critical care time)  Medications  predniSONE (DELTASONE) tablet 60 mg (60 mg Oral Given 07/14/15 1707)    Patient Vitals for the past 24 hrs:  BP Temp Temp src Pulse Resp SpO2  07/14/15 1730 116/71 mmHg - - 63 16 100 %  07/14/15 1700 112/80 mmHg - - (!) 59 14 98 %  07/14/15 1644 124/83 mmHg - - 60 16 97 %  07/14/15 1446 114/71 mmHg - - 62 18 98 %  07/14/15 1317  118/82 mmHg 98 F (36.7 C) Oral 74 16 98 %    5:09 PM Reevaluation with update and discussion. After initial assessment and treatment, an updated evaluation reveals No change in clinical status. He remains comfortable, with reassuring vital signs.. Irwin Review Labs Reviewed - No data to display  Imaging Review Dg Chest 2 View  07/14/2015  CLINICAL DATA:  Intermittent shortness of breath for the past year, worsened over the  past few days. EXAM: CHEST  2 VIEW COMPARISON:  PA and lateral chest 04/05/2015 and 02/27/2015. FINDINGS: The lungs are clear. Heart size is normal. There is no pneumothorax or pleural effusion. No focal bony abnormality. IMPRESSION: No acute disease. Electronically Signed   By: Inge Rise M.D.   On: 07/14/2015 14:11   I have personally reviewed and evaluated these images and lab results as part of my medical decision-making.   EKG Interpretation   Date/Time:  Friday July 14 2015 13:12:25 EDT Ventricular Rate:  77 PR Interval:  130 QRS Duration: 94 QT Interval:  374 QTC Calculation: 423 R Axis:   67 Text Interpretation:  Normal sinus rhythm Cannot rule out Anterior infarct  , age undetermined Abnormal ECG since last tracing no significant change  Confirmed by Eulis Foster  MD, [place] Ferch [phone]) on 07/14/2015 4:44:53 PM      MDM   Final diagnoses:  Acute bronchitis, unspecified organism  Tobacco abuse    Valuation, consistent with bronchitis related to ongoing tobacco abuse. No productive cough at this time. Will treat with prednisone, and albuterol. His ulcerative colitis, is quiescent. Doubt serious bacterial infection. Metabolic instability or impending vascular collapse.  Nursing Notes Reviewed/ Care Coordinated Applicable Imaging Reviewed Interpretation of Laboratory Data incorporated into ED treatment  The patient appears reasonably screened and/or stabilized for discharge and I doubt any other medical condition or other [facility] requiring further screening, evaluation, or treatment in the ED at this time prior to discharge.  Plan: Home Medications- Prednisone, Albuterol; Home Treatments- Stop smoking; return here if the recommended treatment, does not improve the symptoms; Recommended follow up- PCP prn     Daleen Bo, MD 07/14/15 2315

## 2015-08-30 ENCOUNTER — Emergency Department (HOSPITAL_COMMUNITY)
Admission: EM | Admit: 2015-08-30 | Discharge: 2015-08-30 | Disposition: A | Discharge status: Home / Self Care | Payer: Self-pay | Attending: Emergency Medicine | Admitting: Emergency Medicine

## 2015-08-30 ENCOUNTER — Encounter (HOSPITAL_COMMUNITY): Payer: Self-pay | Admitting: *Deleted

## 2015-08-30 DIAGNOSIS — F17210 Nicotine dependence, cigarettes, uncomplicated: Secondary | ICD-10-CM | POA: Insufficient documentation

## 2015-08-30 DIAGNOSIS — J40 Bronchitis, not specified as acute or chronic: Secondary | ICD-10-CM | POA: Insufficient documentation

## 2015-08-30 MED ORDER — PREDNISONE 20 MG PO TABS: ORAL_TABLET | ORAL | 0 refills | Status: DC

## 2015-08-30 MED ORDER — ALBUTEROL SULFATE HFA 108 (90 BASE) MCG/ACT IN AERS: 1.0000 | INHALATION_SPRAY | Freq: Four times a day (QID) | RESPIRATORY_TRACT | 0 refills | Status: DC | PRN

## 2015-08-30 NOTE — ED Triage Notes (Signed)
Pt c/o intermittent shortness of breath for over a year. Pt states he has been seen for exact same symptoms in the past. Pt denies chest pain, pt in no respiratory distress at this time.

## 2015-08-30 NOTE — ED Provider Notes (Signed)
[place] DEPT Provider Note   CSN: [id number] Arrival date & time: 08/30/15  1511  First Provider Contact:   First MD Initiated Contact with Patient 08/30/15 1617      By signing my name below, I, Jerome Irwin, attest that this documentation has been prepared under the direction and in the presence of Jerome Ada, PA-C. Electronically Signed: Judithann Irwin, ED Scribe. 08/30/15. 4:28 PM.    History   Chief Complaint Chief Complaint  Patient presents with  . Shortness of Breath    HPI Comments: Jerome Irwin is a 35 y.o. male who presents to the Emergency Department complaining of ongoing intermittent episodes of moderate SOB onset one year ago. He states that this episode began several days ago. He reports associated nasal congestion. He notes that his symptoms are worsened when it is hot outside. No alleviating factors noted. Pt has not tried any medications PTA. He denies any hx of asthma. He reports that he smokes approx 3 cigarettes a day. Pt was seen here one month ago and had a normal chest x-ray and discharged with an inhaler. He adds that he has since misplaced the inhaler although it provided relief when he was compliant. He denies any fever, chills, cough, or chest pain.    The history is provided by the patient. No language interpreter was used.    Past Medical History:  Diagnosis Date  . Pneumonia   . Ulcerative colitis ([place])     There are no active problems to display for this patient.   History reviewed. No pertinent surgical history.     Home Medications    Prior to Admission medications   Medication Sig Start Date End Date Taking? Authorizing Provider  albuterol (PROVENTIL HFA;VENTOLIN HFA) 108 (90 Base) MCG/ACT inhaler Inhale 1-2 puffs into the lungs every 6 (six) hours as needed for wheezing or shortness of breath. 02/01/15   [place], PA-C  predniSONE (DELTASONE) 20 MG tablet Take 1 tablet (20 mg total) by mouth 2 (two) times  daily. 07/14/15   Jerome Bo, MD  sulfaSALAzine (AZULFIDINE) 500 MG tablet Take 1 tablet (500 mg total) by mouth 4 (four) times daily. Patient not taking: Reported on 07/14/2015 06/23/15   Jerome Senior, PA-C    Family History History reviewed. No pertinent family history.  Social History Social History  Substance Use Topics  . Smoking status: Current Every Day Smoker    Packs/day: 0.50    Types: Cigarettes  . Smokeless tobacco: Never Used  . Alcohol use No     Allergies   Review of patient's allergies indicates no known allergies.   Review of Systems Review of Systems  Constitutional: Negative for chills and fever.  HENT: Positive for congestion.   Respiratory: Positive for shortness of breath. Negative for cough.   Cardiovascular: Negative for chest pain.     Physical Exam Updated Vital Signs BP 112/78 (BP Location: Left Arm)   Pulse 95   Temp 98.1 F (36.7 C) (Oral)   SpO2 99%   Physical Exam  Constitutional: He is oriented to person, place, and time. He appears well-developed and well-nourished. No distress.  HENT:  Head: Normocephalic and atraumatic.  Eyes: Conjunctivae and EOM are normal.  Neck: Neck supple. No tracheal deviation present.  Cardiovascular: Normal rate.   Pulmonary/Chest: Effort normal. No respiratory distress. He has rhonchi.  Mild Rhonchi Nasal congestion  Musculoskeletal: Normal range of motion.  Neurological: He is alert and oriented to person, place, and time.  Skin:  Skin is warm and dry.  Psychiatric: He has a normal mood and affect. His behavior is normal.  Nursing note and vitals reviewed.    ED Treatments / Results  DIAGNOSTIC STUDIES: Oxygen Saturation is 99% on RA, normal by my interpretation.    COORDINATION OF CARE: 4:26 PM- Pt advised of plan for treatment and pt agrees. He will receive prescription for short course of inhaler and albuterol.    Procedures Procedures (including critical care time)  Medications  Ordered in ED Medications - No data to display   Initial Impression / Assessment and Plan / ED Course  Jerome Ada, PA-C has reviewed the triage vital signs and the nursing notes.  Pertinent labs & imaging results that were available during my care of the patient were reviewed by me and considered in my medical decision making (see chart for details).  Clinical Course      Final Clinical Impressions(s) / ED Diagnoses   Final diagnoses:  Bronchitis   Meds ordered this encounter  Medications  . albuterol (PROVENTIL HFA;VENTOLIN HFA) 108 (90 Base) MCG/ACT inhaler    Sig: Inhale 1-2 puffs into the lungs every 6 (six) hours as needed for wheezing or shortness of breath.    Dispense:  1 Inhaler    Refill:  0    Order Specific Question:   Supervising Provider    AnswerJeneen Irwin, Jerome Irwin [id number]  . predniSONE (DELTASONE) 20 MG tablet    Sig: 6,5,4,3,2,1 taper    Dispense:  21 tablet    Refill:  0    Order Specific Question:   Supervising Provider    AnswerJeneen Irwin, Jerome Irwin [phone]   I personally performed the services in this documentation, which was scribed in my presence.  The recorded information has been reviewed and considered.   Jerome Irwin.  New Prescriptions New Prescriptions   No medications on file     Jerome Irwin, Jerome Irwin 08/30/15 1631    Jerome Furry, MD 09/12/15 [phone]

## 2015-10-05 ENCOUNTER — Emergency Department (HOSPITAL_COMMUNITY)
Admission: EM | Admit: 2015-10-05 | Discharge: 2015-10-05 | Disposition: A | Discharge status: Home / Self Care | Payer: Self-pay | Attending: Emergency Medicine | Admitting: Emergency Medicine

## 2015-10-05 ENCOUNTER — Telehealth: Payer: Self-pay | Admitting: *Deleted

## 2015-10-05 ENCOUNTER — Encounter (HOSPITAL_COMMUNITY): Payer: Self-pay | Admitting: Emergency Medicine

## 2015-10-05 ENCOUNTER — Emergency Department (HOSPITAL_COMMUNITY): Payer: Self-pay

## 2015-10-05 DIAGNOSIS — R0600 Dyspnea, unspecified: Secondary | ICD-10-CM

## 2015-10-05 DIAGNOSIS — F17210 Nicotine dependence, cigarettes, uncomplicated: Secondary | ICD-10-CM | POA: Insufficient documentation

## 2015-10-05 LAB — CBC WITH DIFFERENTIAL/PLATELET
BASOS PCT: 1 %
Basophils Absolute: 0 10*3/uL (ref 0.0–0.1)
EOS ABS: 0.5 10*3/uL (ref 0.0–0.7)
EOS PCT: 9 %
HCT: 40.5 % (ref 39.0–52.0)
HEMOGLOBIN: 12.9 g/dL — AB (ref 13.0–17.0)
LYMPHS ABS: 3 10*3/uL (ref 0.7–4.0)
Lymphocytes Relative: 51 %
MCH: 28.6 pg (ref 26.0–34.0)
MCHC: 31.9 g/dL (ref 30.0–36.0)
MCV: 89.8 fL (ref 78.0–100.0)
MONO ABS: 0.8 10*3/uL (ref 0.1–1.0)
MONOS PCT: 14 %
NEUTROS PCT: 25 %
Neutro Abs: 1.5 10*3/uL — ABNORMAL LOW (ref 1.7–7.7)
Platelets: 293 10*3/uL (ref 150–400)
RBC: 4.51 MIL/uL (ref 4.22–5.81)
RDW: 13.9 % (ref 11.5–15.5)
WBC: 5.9 10*3/uL (ref 4.0–10.5)

## 2015-10-05 LAB — BASIC METABOLIC PANEL
Anion gap: 9 (ref 5–15)
BUN: 12 mg/dL (ref 6–20)
CALCIUM: 9.4 mg/dL (ref 8.9–10.3)
CHLORIDE: 106 mmol/L (ref 101–111)
CO2: 25 mmol/L (ref 22–32)
CREATININE: 0.95 mg/dL (ref 0.61–1.24)
GFR calc Af Amer: >60 mL/min (ref >60)
GFR calc non Af Amer: >60 mL/min (ref >60)
GLUCOSE: 106 mg/dL — AB (ref 65–99)
Potassium: 3.4 mmol/L — ABNORMAL LOW (ref 3.5–5.1)
Sodium: 140 mmol/L (ref 135–145)

## 2015-10-05 MED ORDER — IOPAMIDOL (ISOVUE-370) INJECTION 76%
INTRAVENOUS | Status: AC
  Administered 2015-10-05: 100 mL
  Filled 2015-10-05: qty 100

## 2015-10-05 MED ORDER — ALBUTEROL SULFATE (2.5 MG/3ML) 0.083% IN NEBU
5.0000 mg | INHALATION_SOLUTION | Freq: Once | RESPIRATORY_TRACT | Status: AC
  Administered 2015-10-05: 5 mg via RESPIRATORY_TRACT
  Filled 2015-10-05: qty 6

## 2015-10-05 MED ORDER — ALBUTEROL SULFATE HFA 108 (90 BASE) MCG/ACT IN AERS: 1.0000 | INHALATION_SPRAY | Freq: Four times a day (QID) | RESPIRATORY_TRACT | 0 refills | Status: DC | PRN

## 2015-10-05 MED ORDER — PREDNISONE 20 MG PO TABS: 60.0000 mg | ORAL_TABLET | Freq: Every day | ORAL | 0 refills | Status: AC

## 2015-10-05 MED ORDER — IPRATROPIUM BROMIDE 0.02 % IN SOLN
0.5000 mg | Freq: Once | RESPIRATORY_TRACT | Status: AC
  Administered 2015-10-05: 0.5 mg via RESPIRATORY_TRACT
  Filled 2015-10-05: qty 2.5

## 2015-10-05 NOTE — ED Provider Notes (Signed)
[facility] DEPT Provider Note   CSN: [id number] Arrival date & time: 10/05/15  0414     History   Chief Complaint Chief Complaint  Patient presents with  . Shortness of Breath    HPI Jerome Irwin is a 35 y.o. male.  Patient with complaint of shortness of breath. He denies history of asthma but reports multiple episodes of what he has been told is bronchitis. He states that over the past year, treatments provided for these episodes, specifically albuterol inhalers and steroids, are less and less effective. No fever at any time. He reports he has stopped smoking cigarettes and marijuana and symptoms have not improved. No chest pain, abdominal pain, vomiting, myalgias.   The history is provided by the patient. No language interpreter was used.  Shortness of Breath  This is a recurrent problem. Pertinent negatives include no fever, no cough and no chest pain.    Past Medical History:  Diagnosis Date  . Pneumonia   . Ulcerative colitis ([place])     There are no active problems to display for this patient.   History reviewed. No pertinent surgical history.     Home Medications    Prior to Admission medications   Medication Sig Start Date End Date Taking? Authorizing Provider  albuterol (PROVENTIL HFA;VENTOLIN HFA) 108 (90 Base) MCG/ACT inhaler Inhale 1-2 puffs into the lungs every 6 (six) hours as needed for wheezing or shortness of breath. 08/30/15  Yes Hollace Kinnier Sofia, PA-C  predniSONE (DELTASONE) 20 MG tablet 6,5,4,3,2,1 taper Patient not taking: Reported on 10/05/2015 08/30/15   Fransico Meadow, PA-C  sulfaSALAzine (AZULFIDINE) 500 MG tablet Take 1 tablet (500 mg total) by mouth 4 (four) times daily. Patient not taking: Reported on 07/14/2015 06/23/15   Jeannett Senior, PA-C    Family History No family history on file.  Social History Social History  Substance Use Topics  . Smoking status: Current Every Day Smoker    Packs/day: 0.50    Types: Cigarettes  .  Smokeless tobacco: Never Used  . Alcohol use No     Allergies   Review of patient's allergies indicates no known allergies.   Review of Systems Review of Systems  Constitutional: Negative for chills and fever.  HENT: Negative.   Respiratory: Positive for shortness of breath. Negative for cough.   Cardiovascular: Negative.  Negative for chest pain.  Gastrointestinal: Negative.   Musculoskeletal: Negative.   Skin: Negative.   Neurological: Negative.      Physical Exam Updated Vital Signs BP 116/82   Pulse (!) 57   Temp 98.7 F (37.1 C) (Oral)   Resp 16   Ht 5' 10"  (1.778 m)   Wt 81.6 kg   SpO2 95%   BMI 25.83 kg/m   Physical Exam  Constitutional: He is oriented to person, place, and time. He appears well-developed and well-nourished.  HENT:  Head: Normocephalic.  Neck: Normal range of motion. Neck supple.  Cardiovascular: Normal rate and regular rhythm.   Pulmonary/Chest: Effort normal and breath sounds normal. He has no wheezes. He has no rales.  Abdominal: Soft. Bowel sounds are normal. There is no tenderness. There is no rebound and no guarding.  Musculoskeletal: Normal range of motion.  Neurological: He is alert and oriented to person, place, and time.  Skin: Skin is warm and dry. No rash noted.  Psychiatric: He has a normal mood and affect.     ED Treatments / Results  Labs (all labs ordered are listed, but only abnormal  results are displayed) Labs Reviewed  BASIC METABOLIC PANEL  CBC WITH DIFFERENTIAL/PLATELET   Results for orders placed or performed during the hospital encounter of 54/09/81  Basic metabolic panel  Result Value Ref Range   Sodium 140 135 - 145 mmol/L   Potassium 3.4 (L) 3.5 - 5.1 mmol/L   Chloride 106 101 - 111 mmol/L   CO2 25 22 - 32 mmol/L   Glucose, Bld 106 (H) 65 - 99 mg/dL   BUN 12 6 - 20 mg/dL   Creatinine, Ser 0.95 0.61 - 1.24 mg/dL   Calcium 9.4 8.9 - 10.3 mg/dL   GFR calc non Af Amer >60 >60 mL/min   GFR calc Af Amer  >60 >60 mL/min   Anion gap 9 5 - 15  CBC with Differential  Result Value Ref Range   WBC 5.9 4.0 - 10.5 K/uL   RBC 4.51 4.22 - 5.81 MIL/uL   Hemoglobin 12.9 (L) 13.0 - 17.0 g/dL   HCT 40.5 39.0 - 52.0 %   MCV 89.8 78.0 - 100.0 fL   MCH 28.6 26.0 - 34.0 pg   MCHC 31.9 30.0 - 36.0 g/dL   RDW 13.9 11.5 - 15.5 %   Platelets 293 150 - 400 K/uL   Neutrophils Relative % 25 %   Neutro Abs 1.5 (L) 1.7 - 7.7 K/uL   Lymphocytes Relative 51 %   Lymphs Abs 3.0 0.7 - 4.0 K/uL   Monocytes Relative 14 %   Monocytes Absolute 0.8 0.1 - 1.0 K/uL   Eosinophils Relative 9 %   Eosinophils Absolute 0.5 0.0 - 0.7 K/uL   Basophils Relative 1 %   Basophils Absolute 0.0 0.0 - 0.1 K/uL   Ct Angio Chest Pe W/cm &/or Wo Cm  Result Date: 10/05/2015 CLINICAL DATA:  Acute onset of shortness of breath. Initial encounter. EXAM: CT ANGIOGRAPHY CHEST WITH CONTRAST TECHNIQUE: Multidetector CT imaging of the chest was performed using the standard protocol during bolus administration of intravenous contrast. Multiplanar CT image reconstructions and MIPs were obtained to evaluate the vascular anatomy. CONTRAST:  100 mL of Isovue 370 IV contrast COMPARISON:  Chest radiograph performed 07/14/2015 FINDINGS: There is no evidence of pulmonary embolus. Mild right basilar opacity may reflect atelectasis or possibly mild infection. Underlying bronchiectasis is noted at the right middle lobe. There is no evidence of pleural effusion or pneumothorax. No masses are identified; no abnormal focal contrast enhancement is seen. The mediastinum is unremarkable in appearance. No mediastinal lymphadenopathy is seen. No pericardial effusion is identified. The great vessels are grossly unremarkable in appearance. No axillary lymphadenopathy is seen. The visualized portions of the thyroid gland are unremarkable in appearance. The visualized portions of the liver and spleen are unremarkable. Stones are noted at the base of the gallbladder. The  visualized portions of the pancreas, adrenal glands and kidneys are grossly unremarkable. No acute osseous abnormalities are seen. Review of the MIP images confirms the above findings. IMPRESSION: 1. No evidence of pulmonary embolus. 2. Mild right basilar airspace opacity may reflect atelectasis or possibly mild infection. Underlying bronchiectasis at the right middle lobe. 3. Cholelithiasis. Electronically Signed   By: Garald Balding M.D.   On: 10/05/2015 06:50     EKG  EKG Interpretation None       Radiology No results found.  Procedures Procedures (including critical care time)  Medications Ordered in ED Medications  albuterol (PROVENTIL) (2.5 MG/3ML) 0.083% nebulizer solution 5 mg (not administered)  ipratropium (ATROVENT) nebulizer solution 0.5 mg (not  administered)     Initial Impression / Assessment and Plan / ED Course  I have reviewed the triage vital signs and the nursing notes.  Pertinent labs & imaging results that were available during my care of the patient were reviewed by me and considered in my medical decision making (see chart for details).  Clinical Course   1. Dyspnea  Patient presents with recurrent SOB. Chart reviewed. Multiple ED encounters for bronchitis. Here tonight with similar episode post smoking cessation, not getting relief with medications.   Discussed with Dr. Dina Rich. Will order CT chest for more indepth evaluation. CBC, BMET pending.   Labs and chest CT essentially negative. The patient is feeling better after duoneb. Will refer to primary care for further outpatient management. VSS, no hypoxia. He is stable for discharge home.   Final Clinical Impressions(s) / ED Diagnoses   Final diagnoses:  None    New Prescriptions New Prescriptions   No medications on file     Charlann Lange, PA-C 10/06/15 [place], MD 10/06/15 [phone]

## 2015-10-05 NOTE — Telephone Encounter (Signed)
Pharmacy called related to Rx: predniSONE (DELTASONE) 20 MG tablet .Marland KitchenMarland KitchenEDCM clarified with EDPA Irena Cords) to change Rx to: Prednisone 85m; take one tablet daily PO in the AM; #5.

## 2015-10-05 NOTE — ED Triage Notes (Signed)
Pt. reports SOB onset yesterday , denies cough or congestion , no fever or chills.

## 2015-10-05 NOTE — ED Notes (Signed)
Patient transported to CT scan . 

## 2015-10-21 ENCOUNTER — Other Ambulatory Visit: Payer: Self-pay

## 2015-10-21 DIAGNOSIS — F17210 Nicotine dependence, cigarettes, uncomplicated: Secondary | ICD-10-CM | POA: Insufficient documentation

## 2015-10-21 DIAGNOSIS — R0602 Shortness of breath: Secondary | ICD-10-CM | POA: Insufficient documentation

## 2015-10-21 NOTE — ED Triage Notes (Signed)
The pt is c/o having a sinus drainage and feeling like he cannot breathe well that started just a little while ago.  He reports that he comes here all the time for the same and it is usually bronchitis.  He feels like he cannot take a deep breath alert no distress  Nasal congestion

## 2015-10-22 ENCOUNTER — Encounter (HOSPITAL_COMMUNITY): Payer: Self-pay | Admitting: *Deleted

## 2015-10-22 ENCOUNTER — Emergency Department (HOSPITAL_COMMUNITY): Payer: Self-pay

## 2015-10-22 ENCOUNTER — Emergency Department (HOSPITAL_COMMUNITY)
Admission: EM | Admit: 2015-10-22 | Discharge: 2015-10-22 | Disposition: A | Discharge status: Home / Self Care | Payer: Self-pay | Attending: Emergency Medicine | Admitting: Emergency Medicine

## 2015-10-22 DIAGNOSIS — R0602 Shortness of breath: Secondary | ICD-10-CM

## 2015-10-22 MED ORDER — ALBUTEROL SULFATE HFA 108 (90 BASE) MCG/ACT IN AERS: 2.0000 | INHALATION_SPRAY | RESPIRATORY_TRACT | 0 refills | Status: DC | PRN

## 2015-10-22 MED ORDER — PREDNISONE 10 MG (21) PO TBPK: 10.0000 mg | ORAL_TABLET | Freq: Every day | ORAL | 0 refills | Status: DC

## 2015-10-22 MED ORDER — FLUTICASONE PROPIONATE 50 MCG/ACT NA SUSP: 2.0000 | Freq: Every day | NASAL | 0 refills | Status: DC

## 2015-10-22 MED ORDER — IPRATROPIUM-ALBUTEROL 0.5-2.5 (3) MG/3ML IN SOLN
3.0000 mL | Freq: Once | RESPIRATORY_TRACT | Status: AC
  Administered 2015-10-22: 3 mL via RESPIRATORY_TRACT
  Filled 2015-10-22: qty 3

## 2015-10-22 NOTE — ED Notes (Signed)
Pulse Ox ambulating is 95%

## 2015-10-22 NOTE — ED Provider Notes (Signed)
[facility] DEPT Provider Note   CSN: [id number] Arrival date & time: 10/21/15  2339  By signing my name below, I, Maud Deed. Royston Sinner, attest that this documentation has been prepared under the direction and in the presence of Merryl Hacker, MD.  Electronically Signed: Maud Deed. Royston Sinner, ED Scribe. 10/22/15. 12:52 AM.    History   Chief Complaint Chief Complaint  Patient presents with  . Bronchitis   The history is provided by the patient. No language interpreter was used.    HPI Comments: Jerome Irwin is a 35 y.o. male with a PMHx of bronchitis who presents to the Emergency Department complaining of recurrent, intermittent, unchanged shortness of breath x 1 day. He also reports ongoing sinus drainage and nasal congestion. No aggravating or alleviating factors at this time. At home inhaler attempted at home without any long term improvement. No recent fever, chills, nausea, vomiting, or chest pain. Pt admits he comes to the Emergency Department often for same concern. He is not currently followed by a PCP.  Patient reports history of intermittent shortness breath over the last year. Reports being diagnosed with bronchitis. No history of asthma. Former smoker. Recent evaluation with CT angiogram which was negative for blood clots. He has not seen a pulmonologist.  PCP: None    Past Medical History:  Diagnosis Date  . Pneumonia   . Ulcerative colitis ([place])     There are no active problems to display for this patient.   History reviewed. No pertinent surgical history.     Home Medications    Prior to Admission medications   Medication Sig Start Date End Date Taking? Authorizing Provider  sulfaSALAzine (AZULFIDINE) 500 MG tablet Take 1 tablet (500 mg total) by mouth 4 (four) times daily. 06/23/15  Yes Tatyana Kirichenko, PA-C  albuterol (PROVENTIL HFA;VENTOLIN HFA) 108 (90 Base) MCG/ACT inhaler Inhale 2 puffs into the lungs every 4 (four) hours as needed for wheezing or  shortness of breath. 10/22/15   Merryl Hacker, MD  fluticasone (FLONASE) 50 MCG/ACT nasal spray Place 2 sprays into both nostrils daily. 10/22/15   Merryl Hacker, MD  predniSONE (STERAPRED UNI-PAK 21 TAB) 10 MG (21) TBPK tablet Take 1 tablet (10 mg total) by mouth daily. Take 6 tabs by mouth daily  for 2 days, then 5 tabs for 2 days, then 4 tabs for 2 days, then 3 tabs for 2 days, 2 tabs for 2 days, then 1 tab by mouth daily for 2 days 10/22/15   Merryl Hacker, MD    Family History No family history on file.  Social History Social History  Substance Use Topics  . Smoking status: Current Every Day Smoker    Packs/day: 0.50    Types: Cigarettes  . Smokeless tobacco: Never Used  . Alcohol use No     Allergies   Review of patient's allergies indicates no known allergies.   Review of Systems Review of Systems  Constitutional: Negative for chills and fever.  HENT: Positive for congestion and postnasal drip.   Respiratory: Positive for shortness of breath.   Cardiovascular: Negative for chest pain.  Gastrointestinal: Negative for nausea and vomiting.  All other systems reviewed and are negative.    Physical Exam Updated Vital Signs BP 119/88   Pulse 74   Temp 97.7 F (36.5 C)   Resp 15   SpO2 95%   Physical Exam  Constitutional: He is oriented to person, place, and time. He appears well-developed and well-nourished.  HENT:  Head: Normocephalic and atraumatic.  Cardiovascular: Normal rate, regular rhythm and normal heart sounds.   No murmur heard. Pulmonary/Chest: Effort normal and breath sounds normal. No respiratory distress. He has no wheezes.  Abdominal: Soft. Bowel sounds are normal. There is no tenderness. There is no rebound.  Musculoskeletal: He exhibits no edema.  Neurological: He is alert and oriented to person, place, and time.  Skin: Skin is warm and dry.  Psychiatric: He has a normal mood and affect.  Nursing note and vitals reviewed.    ED  Treatments / Results   DIAGNOSTIC STUDIES:   COORDINATION OF CARE: 12:53 AM- Will order EKG. Discussed treatment plan with pt at bedside and pt agreed to plan.     Labs (all labs ordered are listed, but only abnormal results are displayed) Labs Reviewed - No data to display  EKG  EKG Interpretation  Date/Time:  Saturday October 21 2015 23:50:38 EDT Ventricular Rate:  73 PR Interval:  146 QRS Duration: 76 QT Interval:  362 QTC Calculation: 398 R Axis:   60 Text Interpretation:  Normal sinus rhythm Normal ECG Confirmed by Dina Rich  MD, Wyatte Dames ([id number]) on 10/22/2015 12:52:01 AM       Radiology Dg Chest 2 View  Result Date: 10/22/2015 CLINICAL DATA:  Acute onset of shortness of breath. Initial encounter. EXAM: CHEST  2 VIEW COMPARISON:  Chest radiograph performed 07/14/2015, and CTA of the chest performed 10/05/2015 FINDINGS: The lungs are well-aerated. Mild peribronchial thickening is noted. There is no evidence of focal opacification, pleural effusion or pneumothorax. The heart is normal in size; the mediastinal contour is within normal limits. No acute osseous abnormalities are seen. IMPRESSION: Mild peribronchial thickening noted.  Lungs otherwise clear. Electronically Signed   By: Garald Balding M.D.   On: 10/22/2015 01:36    Procedures Procedures (including critical care time)  Medications Ordered in ED Medications  ipratropium-albuterol (DUONEB) 0.5-2.5 (3) MG/3ML nebulizer solution 3 mL (3 mLs Nebulization Given 10/22/15 0102)     Initial Impression / Assessment and Plan / ED Course  I have reviewed the triage vital signs and the nursing notes.  Pertinent labs & imaging results that were available during my care of the patient were reviewed by me and considered in my medical decision making (see chart for details).  Clinical Course    Patient presents with shortness of breath. On and off and multiple evaluations over the last year for the same. States that he has  been told he has bronchitis. No history of asthma. Recently quit smoking. Nontoxic. Afebrile. Does report upper respiratory symptoms including congestion. No active wheezing on exam. X-ray shows peribronchial thickening but no pneumonia. He was given a duo neb to see if that helped. He reports subjective improvement. He's able to ambulate and maintain pulse ox. I suspect the patient may have underlying reactive airway disease and needs to be seen by pulmonology for definitive pulmonary function tests. He does not have a primary physician. We'll give [facility]. Will discharge on tapered steroids. He was given strict return precautions.  After history, exam, and medical workup I feel the patient has been appropriately medically screened and is safe for discharge home. Pertinent diagnoses were discussed with the patient. Patient was given return precautions.   Final Clinical Impressions(s) / ED Diagnoses   Final diagnoses:  SOB (shortness of breath)    New Prescriptions New Prescriptions   ALBUTEROL (PROVENTIL HFA;VENTOLIN HFA) 108 (90 BASE) MCG/ACT INHALER  Inhale 2 puffs into the lungs every 4 (four) hours as needed for wheezing or shortness of breath.   FLUTICASONE (FLONASE) 50 MCG/ACT NASAL SPRAY    Place 2 sprays into both nostrils daily.   PREDNISONE (STERAPRED UNI-PAK 21 TAB) 10 MG (21) TBPK TABLET    Take 1 tablet (10 mg total) by mouth daily. Take 6 tabs by mouth daily  for 2 days, then 5 tabs for 2 days, then 4 tabs for 2 days, then 3 tabs for 2 days, 2 tabs for 2 days, then 1 tab by mouth daily for 2 days   I personally performed the services described in this documentation, which was scribed in my presence. The recorded information has been reviewed and is accurate.    Merryl Hacker, MD 10/22/15 [phone]

## 2015-10-22 NOTE — Discharge Instructions (Signed)
You were seen today for shortness of breath. Her workup remains reassuring. Given your upper respiratory symptoms this could be viral related or bronchitis. However, given the duration of your shortness of breath, you likely need to establish primary care and see pulmonology for pulmonary function testing.

## 2015-11-13 ENCOUNTER — Encounter (HOSPITAL_COMMUNITY): Payer: Self-pay | Admitting: Emergency Medicine

## 2015-11-13 ENCOUNTER — Emergency Department (HOSPITAL_COMMUNITY)
Admission: EM | Admit: 2015-11-13 | Discharge: 2015-11-13 | Disposition: A | Discharge status: Home / Self Care | Payer: Self-pay | Attending: Emergency Medicine | Admitting: Emergency Medicine

## 2015-11-13 DIAGNOSIS — B9689 Other specified bacterial agents as the cause of diseases classified elsewhere: Secondary | ICD-10-CM | POA: Insufficient documentation

## 2015-11-13 DIAGNOSIS — F17210 Nicotine dependence, cigarettes, uncomplicated: Secondary | ICD-10-CM | POA: Insufficient documentation

## 2015-11-13 DIAGNOSIS — H1031 Unspecified acute conjunctivitis, right eye: Secondary | ICD-10-CM | POA: Insufficient documentation

## 2015-11-13 MED ORDER — POLYVINYL ALCOHOL 1.4 % OP SOLN: 1.0000 [drp] | OPHTHALMIC | 0 refills | Status: DC | PRN

## 2015-11-13 MED ORDER — POLYMYXIN B-TRIMETHOPRIM 10000-0.1 UNIT/ML-% OP SOLN
1.0000 [drp] | OPHTHALMIC | 0 refills | Status: DC
Start: 2015-11-13 — End: 2015-11-16

## 2015-11-13 NOTE — ED Notes (Signed)
Declined W/C at D/C and was escorted to lobby by RN. 

## 2015-11-13 NOTE — ED Provider Notes (Signed)
[facility] DEPT Provider Note   CSN: [id number] Arrival date & time: 11/13/15  1208  By signing my name below, I, [name], attest that this documentation has been prepared under the direction and in the presence of [facility], PA-C. Electronically Signed: Judithann Sauger, ED Scribe. 11/13/15. 1:30 PM.   History   Chief Complaint Chief Complaint  Patient presents with  . Eye Pain    HPI Comments: Jerome Irwin is a 35 y.o. male who presents to the Emergency Department complaining of gradually worsening moderate right eye discomfort, itchiness, with redness and tearing onset 2-3 days ago. He denies any eye pain but reports associated mild eye discomfort and mild blurred vision out of right eye. No alleviating factors noted. He states that he tried someone else's antibiotic eye drops with no relief. He denies any known eye injury or trauma. He states that he was around someone who had red eye prior to onset of his symptoms. He denies any contact use. He denies a hx of seasonal allergies. He also denies any fever, chills, cough, rhinorrhea, penile discharge, urinary symptoms, or any other symptoms.   The history is provided by the patient. No language interpreter was used.    Past Medical History:  Diagnosis Date  . Pneumonia   . Ulcerative colitis ([place])     There are no active problems to display for this patient.   History reviewed. No pertinent surgical history.     Home Medications    Prior to Admission medications   Medication Sig Start Date End Date Taking? Authorizing Provider  albuterol (PROVENTIL HFA;VENTOLIN HFA) 108 (90 Base) MCG/ACT inhaler Inhale 2 puffs into the lungs every 4 (four) hours as needed for wheezing or shortness of breath. 10/22/15   Merryl Hacker, MD  fluticasone (FLONASE) 50 MCG/ACT nasal spray Place 2 sprays into both nostrils daily. 10/22/15   Merryl Hacker, MD  polyvinyl alcohol (ARTIFICIAL TEARS) 1.4 % ophthalmic solution  Place 1 drop into the right eye as needed for dry eyes. 11/13/15   Clayton Bibles, PA-C  predniSONE (STERAPRED UNI-PAK 21 TAB) 10 MG (21) TBPK tablet Take 1 tablet (10 mg total) by mouth daily. Take 6 tabs by mouth daily  for 2 days, then 5 tabs for 2 days, then 4 tabs for 2 days, then 3 tabs for 2 days, 2 tabs for 2 days, then 1 tab by mouth daily for 2 days 10/22/15   Merryl Hacker, MD  sulfaSALAzine (AZULFIDINE) 500 MG tablet Take 1 tablet (500 mg total) by mouth 4 (four) times daily. 06/23/15   Tatyana Kirichenko, PA-C  trimethoprim-polymyxin b (POLYTRIM) ophthalmic solution Place 1 drop into the right eye every 4 (four) hours. X 7-10 days 11/13/15   Clayton Bibles, PA-C    Family History History reviewed. No pertinent family history.  Social History Social History  Substance Use Topics  . Smoking status: Current Every Day Smoker    Packs/day: 0.50    Types: Cigarettes  . Smokeless tobacco: Never Used  . Alcohol use No     Allergies   Review of patient's allergies indicates no known allergies.   Review of Systems Review of Systems  Constitutional: Negative for chills and fever.  HENT: Negative for congestion, rhinorrhea and sinus pressure.   Eyes: Positive for discharge, redness, itching and visual disturbance. Negative for pain.  Respiratory: Negative for cough.   Genitourinary: Negative for discharge.  All other systems reviewed and are negative.    Physical Exam Updated  Vital Signs BP 139/95 (BP Location: Right Arm)   Pulse 77   Temp 98.5 F (36.9 C) (Oral)   Resp 18   SpO2 97%   Physical Exam  Constitutional: He appears well-developed and well-nourished. No distress.  HENT:  Head: Normocephalic and atraumatic.  Eyes: Right conjunctiva is injected.  Right eye injected, clear discharge No edema, erythema or tenderness of eyelid or peri-orbital space EOMs intact   Neck: Neck supple.  Pulmonary/Chest: Effort normal.  Neurological: He is alert.  Skin: He is not  diaphoretic.  Nursing note and vitals reviewed.    ED Treatments / Results  DIAGNOSTIC STUDIES: Oxygen Saturation is 97% on RA, normal by my interpretation.    COORDINATION OF CARE: 1:29 PM- Pt advised of plan for treatment and pt agrees.    Labs (all labs ordered are listed, but only abnormal results are displayed) Labs Reviewed - No data to display  EKG  EKG Interpretation None       Radiology No results found.  Procedures Procedures (including critical care time)  Medications Ordered in ED Medications - No data to display   Initial Impression / Assessment and Plan / ED Course  Clayton Bibles, PA-C has reviewed the triage vital signs and the nursing notes.  Pertinent labs & imaging results that were available during my care of the patient were reviewed by me and considered in my medical decision making (see chart for details).  Clinical Course    Afebrile, nontoxic patient with redness and discomfort of right eye.  Recent sick contact with someone with conjunctivitis.  No URI symptoms, no allergies, no eye injury.   D/C home with polytrim, artificial tear drops, PCP follow up.  Discussed result, findings, treatment, and follow up  with patient.  Pt given return precautions.  Pt verbalizes understanding and agrees with plan.       Final Clinical Impressions(s) / ED Diagnoses   Final diagnoses:  Acute bacterial conjunctivitis of right eye    New Prescriptions Discharge Medication List as of 11/13/2015  1:30 PM    START taking these medications   Details  polyvinyl alcohol (ARTIFICIAL TEARS) 1.4 % ophthalmic solution Place 1 drop into the right eye as needed for dry eyes., Starting Mon 11/13/2015, Print    trimethoprim-polymyxin b (POLYTRIM) ophthalmic solution Place 1 drop into the right eye every 4 (four) hours. X 7-10 days, Starting Mon 11/13/2015, Print       I personally performed the services described in this documentation, which was scribed in my  presence. The recorded information has been reviewed and is accurate.    Clayton Bibles, PA-C 11/13/15 [place], MD 11/13/15 2206

## 2015-11-13 NOTE — Discharge Instructions (Signed)
Read the information below.  Use the prescribed medication as directed.  Please discuss all new medications with your pharmacist.  You may return to the Emergency Department at any time for worsening condition or any new symptoms that concern you.      If you develop worsening pain in your eye, change in your vision, swelling around your eye, difficulty moving your eye, or fevers greater than 100.4, see your eye doctor or return to the Emergency Department immediately for a recheck.

## 2015-11-13 NOTE — ED Triage Notes (Signed)
Pt sts right eye pain and redness with irritation and tearing

## 2015-11-16 ENCOUNTER — Encounter (HOSPITAL_COMMUNITY): Payer: Self-pay | Admitting: *Deleted

## 2015-11-16 ENCOUNTER — Emergency Department (HOSPITAL_COMMUNITY)
Admission: EM | Admit: 2015-11-16 | Discharge: 2015-11-16 | Disposition: A | Discharge status: Home / Self Care | Payer: Self-pay | Attending: Emergency Medicine | Admitting: Emergency Medicine

## 2015-11-16 DIAGNOSIS — H1033 Unspecified acute conjunctivitis, bilateral: Secondary | ICD-10-CM | POA: Insufficient documentation

## 2015-11-16 DIAGNOSIS — F17210 Nicotine dependence, cigarettes, uncomplicated: Secondary | ICD-10-CM | POA: Insufficient documentation

## 2015-11-16 MED ORDER — PSEUDOEPHEDRINE HCL 60 MG PO TABS: 60.0000 mg | ORAL_TABLET | Freq: Four times a day (QID) | ORAL | 0 refills | Status: DC | PRN

## 2015-11-16 MED ORDER — POLYMYXIN B-TRIMETHOPRIM 10000-0.1 UNIT/ML-% OP SOLN: 1.0000 [drp] | OPHTHALMIC | 0 refills | Status: AC

## 2015-11-16 NOTE — ED Notes (Signed)
Pt ambulating independently w/ steady gait on d/c in no acute distress, A&Ox4. Rx given x2

## 2015-11-16 NOTE — ED Provider Notes (Signed)
[facility] DEPT Provider Note   CSN: [id number] Arrival date & time: 11/16/15  1759     History   Chief Complaint Chief Complaint  Patient presents with  . Conjunctivitis    HPI Jerome Irwin is a 35 y.o. male.  HPI   Patient who was seen 10/16 (by me) for right eye conjunctivitis presents today for spread to the left eye and no improvement on the right.  States he is having excessive tearing that burns the skin around his eyes and he is therefore putting vaseline around his eyes to protect his skin.  Has developed nasal congestion and mild sore throat as well.  Has had sick contacts with people with sinusitis.  Denies STD symptoms but has had unprotected sex.  Is using the polytrim drops QID.    Past Medical History:  Diagnosis Date  . Pneumonia   . Ulcerative colitis ([place])     There are no active problems to display for this patient.   History reviewed. No pertinent surgical history.     Home Medications    Prior to Admission medications   Medication Sig Start Date End Date Taking? Authorizing Provider  albuterol (PROVENTIL HFA;VENTOLIN HFA) 108 (90 Base) MCG/ACT inhaler Inhale 2 puffs into the lungs every 4 (four) hours as needed for wheezing or shortness of breath. 10/22/15   Merryl Hacker, MD  fluticasone (FLONASE) 50 MCG/ACT nasal spray Place 2 sprays into both nostrils daily. 10/22/15   Merryl Hacker, MD  polyvinyl alcohol (ARTIFICIAL TEARS) 1.4 % ophthalmic solution Place 1 drop into the right eye as needed for dry eyes. 11/13/15   Clayton Bibles, PA-C  predniSONE (STERAPRED UNI-PAK 21 TAB) 10 MG (21) TBPK tablet Take 1 tablet (10 mg total) by mouth daily. Take 6 tabs by mouth daily  for 2 days, then 5 tabs for 2 days, then 4 tabs for 2 days, then 3 tabs for 2 days, 2 tabs for 2 days, then 1 tab by mouth daily for 2 days 10/22/15   Merryl Hacker, MD  pseudoephedrine (SUDAFED) 60 MG tablet Take 1 tablet (60 mg total) by mouth every 6 (six) hours as  needed for congestion. 11/16/15   Clayton Bibles, PA-C  sulfaSALAzine (AZULFIDINE) 500 MG tablet Take 1 tablet (500 mg total) by mouth 4 (four) times daily. 06/23/15   Tatyana Kirichenko, PA-C  trimethoprim-polymyxin b (POLYTRIM) ophthalmic solution Place 1 drop into both eyes every 3 (three) hours while awake. 11/16/15 11/23/15  Clayton Bibles, PA-C    Family History History reviewed. No pertinent family history.  Social History Social History  Substance Use Topics  . Smoking status: Current Every Day Smoker    Packs/day: 0.50    Types: Cigarettes  . Smokeless tobacco: Never Used  . Alcohol use No     Allergies   Review of patient's allergies indicates no known allergies.   Review of Systems Review of Systems  Constitutional: Negative for chills and fever.  HENT: Positive for congestion and sore throat. Negative for mouth sores and trouble swallowing.   Respiratory: Negative for cough and shortness of breath.   Gastrointestinal: Negative for abdominal pain.  Genitourinary: Negative for discharge, dysuria, frequency, scrotal swelling, testicular pain and urgency.  Allergic/Immunologic: Negative for immunocompromised state.     Physical Exam Updated Vital Signs BP 132/91 (BP Location: Left Arm)   Pulse 80   Temp 98.6 F (37 C) (Oral)   Resp 16   Ht 5' 11"  (1.803 m)   Wt  88.5 kg   SpO2 98%   BMI 27.20 kg/m   Physical Exam  Constitutional: He appears well-developed and well-nourished. No distress.  HENT:  Head: Normocephalic and atraumatic.  Eyes: EOM are normal. Pupils are equal, round, and reactive to light. Right eye exhibits discharge. Left eye exhibits discharge. Right conjunctiva is injected. Left conjunctiva is injected. No scleral icterus.  Neck: Neck supple.  Pulmonary/Chest: Effort normal.  Neurological: He is alert.  Skin: He is not diaphoretic.  Nursing note and vitals reviewed.    ED Treatments / Results  Labs (all labs ordered are listed, but only  abnormal results are displayed) Labs Reviewed  RPR  HIV ANTIBODY (ROUTINE TESTING)  GC/CHLAMYDIA PROBE AMP ([place]) NOT AT [facility]    EKG  EKG Interpretation None       Radiology No results found.  Procedures Procedures (including critical care time)  Medications Ordered in ED Medications - No data to display   Initial Impression / Assessment and Plan / ED Course  I have reviewed the triage vital signs and the nursing notes.  Pertinent labs & imaging results that were available during my care of the patient were reviewed by me and considered in my medical decision making (see chart for details).  Clinical Course   Afebrile, nontoxic patient with bilateral conjunctivitis.  Seen 3 days ago with right sided conjunctivitis. No eye trauma.  It may be that he is not using the eyedrops frequently enough or it may be that this is actually viral.  He has potential for chlamydia infection and we discussed the option of treating this and testing for STDs - he opts for both.   D/C home with polytrim soln for bilateral eyes Q 3 hrs, sinus medications, ophthalmology follow up if no improvement.  Discussed result, findings, treatment, and follow up  with patient.  Pt given return precautions.  Pt verbalizes understanding and agrees with plan.       Final Clinical Impressions(s) / ED Diagnoses   Final diagnoses:  Acute conjunctivitis of both eyes, unspecified acute conjunctivitis type    New Prescriptions Discharge Medication List as of 11/16/2015  8:12 PM    START taking these medications   Details  pseudoephedrine (SUDAFED) 60 MG tablet Take 1 tablet (60 mg total) by mouth every 6 (six) hours as needed for congestion., Starting Thu 11/16/2015, Print         [place], PA-C 11/27/11 1438    Delora Fuel, MD 88/75/79 [id number]

## 2015-11-16 NOTE — Discharge Instructions (Signed)
Read the information below.  Use the prescribed medication as directed.  Please discuss all new medications with your pharmacist.  You may return to the Emergency Department at any time for worsening condition or any new symptoms that concern you.  If you develop high fevers that do not resolve with tylenol or ibuprofen, you have difficulty swallowing or breathing, or you are unable to tolerate fluids by mouth, return to the ER for a recheck.    °

## 2015-11-16 NOTE — ED Triage Notes (Signed)
Pt seen here for right eye redness two days ago. Pt states now left eye is red.

## 2015-11-17 ENCOUNTER — Encounter (HOSPITAL_COMMUNITY): Payer: Self-pay | Admitting: Emergency Medicine

## 2015-11-17 ENCOUNTER — Emergency Department (HOSPITAL_COMMUNITY)
Admission: EM | Admit: 2015-11-17 | Discharge: 2015-11-17 | Disposition: A | Discharge status: Home / Self Care | Payer: Self-pay | Attending: Emergency Medicine | Admitting: Emergency Medicine

## 2015-11-17 DIAGNOSIS — F17210 Nicotine dependence, cigarettes, uncomplicated: Secondary | ICD-10-CM | POA: Insufficient documentation

## 2015-11-17 DIAGNOSIS — H109 Unspecified conjunctivitis: Secondary | ICD-10-CM | POA: Insufficient documentation

## 2015-11-17 LAB — GC/CHLAMYDIA PROBE AMP (~~LOC~~) NOT AT ARMC
Chlamydia: NEGATIVE
Neisseria Gonorrhea: NEGATIVE

## 2015-11-17 LAB — RPR: RPR: NONREACTIVE

## 2015-11-17 LAB — HIV ANTIBODY (ROUTINE TESTING W REFLEX): HIV Screen 4th Generation wRfx: NONREACTIVE

## 2015-11-17 MED ORDER — TETRACAINE HCL 0.5 % OP SOLN
2.0000 [drp] | Freq: Once | OPHTHALMIC | Status: AC
  Administered 2015-11-17: 2 [drp] via OPHTHALMIC
  Filled 2015-11-17: qty 4

## 2015-11-17 MED ORDER — FLUORESCEIN SODIUM 1 MG OP STRP
1.0000 | ORAL_STRIP | Freq: Once | OPHTHALMIC | Status: AC
  Administered 2015-11-17: 1 via OPHTHALMIC
  Filled 2015-11-17: qty 1

## 2015-11-17 NOTE — ED Provider Notes (Signed)
[facility] DEPT Provider Note   CSN: [id number] Arrival date & time: 11/17/15  1242     History   Chief Complaint Chief Complaint  Patient presents with  . Conjunctivitis    HPI Jerome Irwin is a 35 y.o. male who presents with bilateral conjunctivitis.  He denies any vision changes, however does endorse some photophobia. He has had clear drainage and associated irritation. Patient has been seen twice in the past 4 days for these symptoms. He has been taking his Polytrim drops as prescribed. Patient returns because his symptoms are not improving and he is beginning to feel some fatigue and nasal congestion that is worsening. Patient also reports he felt a hot flash today. Patient denies any chest pain, cough, abdominal pain, nausea, vomiting, urinary symptoms. He was referred to ophthalmology, however he has not called their office yet. He denies trauma to his eyes.  HPI  Past Medical History:  Diagnosis Date  . Pneumonia   . Ulcerative colitis ([place])     There are no active problems to display for this patient.   History reviewed. No pertinent surgical history.     Home Medications    Prior to Admission medications   Medication Sig Start Date End Date Taking? Authorizing Provider  albuterol (PROVENTIL HFA;VENTOLIN HFA) 108 (90 Base) MCG/ACT inhaler Inhale 2 puffs into the lungs every 4 (four) hours as needed for wheezing or shortness of breath. 10/22/15   Merryl Hacker, MD  fluticasone (FLONASE) 50 MCG/ACT nasal spray Place 2 sprays into both nostrils daily. 10/22/15   Merryl Hacker, MD  polyvinyl alcohol (ARTIFICIAL TEARS) 1.4 % ophthalmic solution Place 1 drop into the right eye as needed for dry eyes. 11/13/15   Clayton Bibles, PA-C  predniSONE (STERAPRED UNI-PAK 21 TAB) 10 MG (21) TBPK tablet Take 1 tablet (10 mg total) by mouth daily. Take 6 tabs by mouth daily  for 2 days, then 5 tabs for 2 days, then 4 tabs for 2 days, then 3 tabs for 2 days, 2 tabs for 2  days, then 1 tab by mouth daily for 2 days 10/22/15   Merryl Hacker, MD  pseudoephedrine (SUDAFED) 60 MG tablet Take 1 tablet (60 mg total) by mouth every 6 (six) hours as needed for congestion. 11/16/15   Clayton Bibles, PA-C  sulfaSALAzine (AZULFIDINE) 500 MG tablet Take 1 tablet (500 mg total) by mouth 4 (four) times daily. 06/23/15   Tatyana Kirichenko, PA-C  trimethoprim-polymyxin b (POLYTRIM) ophthalmic solution Place 1 drop into both eyes every 3 (three) hours while awake. 11/16/15 11/23/15  Clayton Bibles, PA-C    Family History No family history on file.  Social History Social History  Substance Use Topics  . Smoking status: Current Every Day Smoker    Packs/day: 0.50    Types: Cigarettes  . Smokeless tobacco: Never Used  . Alcohol use No     Allergies   Review of patient's allergies indicates no known allergies.   Review of Systems Review of Systems  Constitutional: Negative for chills and fever.  HENT: Positive for congestion and sore throat. Negative for facial swelling and sinus pressure.   Eyes: Positive for photophobia, pain, discharge, redness and itching.  Respiratory: Negative for cough and shortness of breath.   Cardiovascular: Negative for chest pain.  Gastrointestinal: Negative for abdominal pain, nausea and vomiting.  Genitourinary: Negative for dysuria.  Musculoskeletal: Negative for back pain.  Skin: Negative for rash and wound.  Neurological: Negative for headaches.  Psychiatric/Behavioral: The  patient is not nervous/anxious.      Physical Exam Updated Vital Signs BP 114/74   Pulse 71   Temp 97.9 F (36.6 C)   Resp 16   Ht 5' 11"  (1.803 m)   Wt 83.9 kg   SpO2 95%   BMI 25.80 kg/m   Physical Exam  Constitutional: He appears well-developed and well-nourished. No distress.  HENT:  Head: Normocephalic and atraumatic.  Mouth/Throat: Mucous membranes are normal. Posterior oropharyngeal erythema (mild) present. No oropharyngeal exudate, posterior  oropharyngeal edema or tonsillar abscesses. Tonsils are 0 on the right. Tonsils are 0 on the left. No tonsillar exudate.  Eyes: EOM and lids are normal. Pupils are equal, round, and reactive to light. Right eye exhibits discharge. Left eye exhibits discharge. Right conjunctiva is injected. Left conjunctiva is injected. No scleral icterus.  No uptake on fluorescein stain; clear bilateral discharge  Neck: Normal range of motion. Neck supple. No thyromegaly present.  Cardiovascular: Normal rate, regular rhythm, normal heart sounds and intact distal pulses.  Exam reveals no gallop and no friction rub.   No murmur heard. Pulmonary/Chest: Effort normal and breath sounds normal. No stridor. No respiratory distress. He has no wheezes. He has no rales.  Abdominal: Soft. Bowel sounds are normal. He exhibits no distension. There is no tenderness. There is no rebound and no guarding.  Musculoskeletal: He exhibits no edema.  Lymphadenopathy:    He has no cervical adenopathy.  Neurological: He is alert. Coordination normal.  Skin: Skin is warm and dry. No rash noted. He is not diaphoretic. No pallor.  Psychiatric: He has a normal mood and affect.  Nursing note and vitals reviewed.    ED Treatments / Results  Labs (all labs ordered are listed, but only abnormal results are displayed) Labs Reviewed - No data to display  EKG  EKG Interpretation None       Radiology No results found.  Procedures Procedures (including critical care time)  Medications Ordered in ED Medications  tetracaine (PONTOCAINE) 0.5 % ophthalmic solution 2 drop (2 drops Both Eyes Given 11/17/15 1332)  fluorescein ophthalmic strip 1 strip (1 strip Both Eyes Given 11/17/15 1332)     Initial Impression / Assessment and Plan / ED Course  I have reviewed the triage vital signs and the nursing notes.  Pertinent labs & imaging results that were available during my care of the patient were reviewed by me and considered in my  medical decision making (see chart for details).  Clinical Course    Patient presentation consistent with viral conjunctivitis.  No evidence of corneal abrasions, entrapment, consensual photophobia, or herpes keratitis.  Presentation not concerning for iritis, or corneal abrasions.  Lungs clear to auscultation. Patient advised to finish course of Polytrim prescribed in the past. Patient also advised to fill Sudafed prescription as well as alternate Tylenol and ibuprofen to assist with his fatigue. Personal hygiene and frequent handwashing discussed.  Patient advised to follow up with ophthalmologist. Return precautions discussed.  Patient verbalizes understanding and is agreeable with discharge.   Final Clinical Impressions(s) / ED Diagnoses   Final diagnoses:  Conjunctivitis of both eyes, unspecified conjunctivitis type    New Prescriptions New Prescriptions   No medications on file     Frederica Kuster, PA-C 11/17/15 1411    Charlesetta Shanks, MD 11/19/15 [phone]

## 2015-11-17 NOTE — ED Triage Notes (Signed)
Pt verbalizes continued conjunctivitis and swelling to bilateral eyes.

## 2015-11-17 NOTE — Progress Notes (Signed)
Pt c/o pressure /reddness and swelling to both eyes. He stated the right eye feels worse. Denies any injury.

## 2015-11-17 NOTE — Discharge Instructions (Signed)
Treatment: Begin taking pseudoephedrine prescribed to you at your  last visit. Continue taking your antibiotic drops. You can alternate Tylenol and ibuprofen every 3 hours as prescribed over-the-counter. Make sure to drink plenty of water.  Follow-up: Please follow-up with the ophthalmologist by calling his office for further evaluation of your red eyes. Please return to emergency department if you develop any new or worsening symptoms.

## 2016-01-19 ENCOUNTER — Emergency Department (HOSPITAL_COMMUNITY)
Admission: EM | Admit: 2016-01-19 | Discharge: 2016-01-19 | Disposition: A | Discharge status: Home / Self Care | Payer: Self-pay | Attending: Emergency Medicine | Admitting: Emergency Medicine

## 2016-01-19 ENCOUNTER — Encounter (HOSPITAL_COMMUNITY): Payer: Self-pay

## 2016-01-19 ENCOUNTER — Emergency Department (HOSPITAL_COMMUNITY): Payer: Self-pay

## 2016-01-19 DIAGNOSIS — Z79899 Other long term (current) drug therapy: Secondary | ICD-10-CM | POA: Insufficient documentation

## 2016-01-19 DIAGNOSIS — J40 Bronchitis, not specified as acute or chronic: Secondary | ICD-10-CM | POA: Insufficient documentation

## 2016-01-19 DIAGNOSIS — F17210 Nicotine dependence, cigarettes, uncomplicated: Secondary | ICD-10-CM | POA: Insufficient documentation

## 2016-01-19 HISTORY — DX: Bronchitis, not specified as acute or chronic: J40

## 2016-01-19 MED ORDER — PREDNISONE 20 MG PO TABS: ORAL_TABLET | ORAL | 0 refills | Status: DC

## 2016-01-19 NOTE — ED Provider Notes (Signed)
[facility] DEPT Provider Note   CSN: [id number] Arrival date & time: 01/19/16  1004  By signing my name below, I, Higinio Plan, attest that this documentation has been prepared under the direction and in the presence of non-physician practitioner, Domenic Moras, PA-C. Electronically Signed: Higinio Plan, Scribe. 01/19/2016. 10:44 AM.  History   Chief Complaint Chief Complaint  Patient presents with  . Cough   The history is provided by the patient. No language interpreter was used.   HPI Comments: Jerome Irwin is a 35 y.o. male with PMHx of bronchitis, pneumonia and ulcerative colitis, who presents to the Emergency Department complaining of gradually worsening, shortness of breath that began 2-3 days ago. Pt reports he visited the ED this morning because he believes he is experiencing a "flare-up" of bronchitis. He states associated congestion and reports his shortness of breath is exacerbated with movement and exertion. He notes he has used multiple "pumps" of his albuterol inhaler over the past 2 days with no relief. He states he has received a referral to see a pulmonologist but has not made an appointment yet. Pt reports hx of smoking cigarettes and notes he now only "smokes occasionally;" he also states he smokes marijuana occasionally as well. He denies rhinorrhea, sneezing, cough, fever, chills, chest pain, abdominal pain, back pain, long recent travel, hx of blood clots and PE. He notes he is currently taking sulfasalazine for his colitis.   Past Medical History:  Diagnosis Date  . Bronchitis   . Pneumonia   . Ulcerative colitis ([place])    There are no active problems to display for this patient.  History reviewed. No pertinent surgical history.  Home Medications    Prior to Admission medications   Medication Sig Start Date End Date Taking? Authorizing Provider  albuterol (PROVENTIL HFA;VENTOLIN HFA) 108 (90 Base) MCG/ACT inhaler Inhale 2 puffs into the lungs every 4 (four)  hours as needed for wheezing or shortness of breath. 10/22/15   Merryl Hacker, MD  fluticasone (FLONASE) 50 MCG/ACT nasal spray Place 2 sprays into both nostrils daily. 10/22/15   Merryl Hacker, MD  polyvinyl alcohol (ARTIFICIAL TEARS) 1.4 % ophthalmic solution Place 1 drop into the right eye as needed for dry eyes. 11/13/15   Clayton Bibles, PA-C  predniSONE (STERAPRED UNI-PAK 21 TAB) 10 MG (21) TBPK tablet Take 1 tablet (10 mg total) by mouth daily. Take 6 tabs by mouth daily  for 2 days, then 5 tabs for 2 days, then 4 tabs for 2 days, then 3 tabs for 2 days, 2 tabs for 2 days, then 1 tab by mouth daily for 2 days 10/22/15   Merryl Hacker, MD  pseudoephedrine (SUDAFED) 60 MG tablet Take 1 tablet (60 mg total) by mouth every 6 (six) hours as needed for congestion. 11/16/15   Clayton Bibles, PA-C  sulfaSALAzine (AZULFIDINE) 500 MG tablet Take 1 tablet (500 mg total) by mouth 4 (four) times daily. 06/23/15   Jeannett Senior, PA-C    Family History No family history on file.  Social History Social History  Substance Use Topics  . Smoking status: Current Every Day Smoker    Packs/day: 0.50    Types: Cigarettes  . Smokeless tobacco: Never Used  . Alcohol use No     Allergies   Patient has no known allergies.   Review of Systems Review of Systems  Constitutional: Negative for chills and fever.  HENT: Positive for congestion. Negative for rhinorrhea and sneezing.   Respiratory: Positive for  shortness of breath. Negative for cough.   Cardiovascular: Negative for chest pain.  Gastrointestinal: Negative for abdominal pain.  Musculoskeletal: Negative for back pain.   Physical Exam Updated Vital Signs BP 131/84 (BP Location: Right Arm)   Pulse 74   Temp 98.2 F (36.8 C) (Oral)   Resp 16   Ht 5' 11"  (1.803 m)   Wt 185 lb (83.9 kg)   SpO2 95%   BMI 25.80 kg/m   Physical Exam  Constitutional: He is oriented to person, place, and time. He appears well-developed and  well-nourished.  HENT:  Head: Normocephalic.  Right Ear: External ear normal.  Left Ear: External ear normal.  Mouth/Throat: No oropharyngeal exudate.  Eyes: EOM are normal.  Neck: Normal range of motion.  Cardiovascular: Normal rate and regular rhythm.   Pulmonary/Chest: Effort normal and breath sounds normal. He has no wheezes.  No obvious wheezes, rales or rhonchi.   Abdominal: He exhibits no distension.  Musculoskeletal: Normal range of motion.  Neurological: He is alert and oriented to person, place, and time.  Psychiatric: He has a normal mood and affect.  Nursing note and vitals reviewed.  ED Treatments / Results  Labs (all labs ordered are listed, but only abnormal results are displayed) Labs Reviewed - No data to display  EKG  EKG Interpretation None       Radiology Dg Chest 2 View  Result Date: 01/19/2016 CLINICAL DATA:  Shortness of breath. EXAM: CHEST  2 VIEW COMPARISON:  10/22/2015.  07/14/2015 . FINDINGS: Mediastinum and hilar structures are normal. No focal infiltrate. No pleural effusion or pneumothorax. No acute bony abnormality . IMPRESSION: No acute cardiopulmonary disease. Electronically Signed   By: Marcello Moores  Register   On: 01/19/2016 11:01    Procedures Procedures (including critical care time)  Medications Ordered in ED Medications - No data to display  DIAGNOSTIC STUDIES:  Oxygen Saturation is 95% on RA, normal by my interpretation.    COORDINATION OF CARE:  10:41 AM Discussed treatment plan with pt at bedside and pt agreed to plan.  Initial Impression / Assessment and Plan / ED Course  I have reviewed the triage vital signs and the nursing notes.  Pertinent labs & imaging results that were available during my care of the patient were reviewed by me and considered in my medical decision making (see chart for details).  Clinical Course     BP 131/84 (BP Location: Right Arm)   Pulse 74   Temp 98.2 F (36.8 C) (Oral)   Resp 16   Ht 5'  11" (1.803 m)   Wt 83.9 kg   SpO2 95%   BMI 25.80 kg/m    Final Clinical Impressions(s) / ED Diagnoses   Final diagnoses:  Bronchitis    New Prescriptions New Prescriptions   PREDNISONE (DELTASONE) 20 MG TABLET    2 tabs po daily x 4 days   I personally performed the services described in this documentation, which was scribed in my presence. The recorded information has been reviewed and is accurate.   Pt here with cough and sob.  No overt wheezing on exam.  Resting comfortably.  Report hx of recurrent bronchitis, usually improves with steroid.  Xray neg.  Will prescribe a short course of steroid.  Doubt PE or other acute cardiopulmonary etiology.     Domenic Moras, PA-C 01/19/16 1114    Charlesetta Shanks, MD 01/20/16 [phone]

## 2016-01-19 NOTE — ED Notes (Signed)
Patient transported to X-ray 

## 2016-01-19 NOTE — ED Triage Notes (Signed)
Per Pt, Pt reports "it is hard to breath." Pt has Hx of Bronchitis. Denies specific cough or congestion. Denies pain. Reports "I feel like I am having a flare up."

## 2016-02-04 ENCOUNTER — Encounter (HOSPITAL_COMMUNITY): Payer: Self-pay | Admitting: Emergency Medicine

## 2016-02-04 ENCOUNTER — Emergency Department (HOSPITAL_COMMUNITY)
Admission: EM | Admit: 2016-02-04 | Discharge: 2016-02-04 | Disposition: A | Discharge status: Home / Self Care | Payer: Self-pay | Attending: Emergency Medicine | Admitting: Emergency Medicine

## 2016-02-04 DIAGNOSIS — F17210 Nicotine dependence, cigarettes, uncomplicated: Secondary | ICD-10-CM | POA: Insufficient documentation

## 2016-02-04 DIAGNOSIS — R369 Urethral discharge, unspecified: Secondary | ICD-10-CM | POA: Insufficient documentation

## 2016-02-04 LAB — URINALYSIS, ROUTINE W REFLEX MICROSCOPIC
BACTERIA UA: NONE SEEN
BILIRUBIN URINE: NEGATIVE
Glucose, UA: NEGATIVE mg/dL
Ketones, ur: NEGATIVE mg/dL
NITRITE: NEGATIVE
PH: 5 (ref 5.0–8.0)
Protein, ur: NEGATIVE mg/dL
SPECIFIC GRAVITY, URINE: 1.025 (ref 1.005–1.030)
Squamous Epithelial / LPF: NONE SEEN

## 2016-02-04 MED ORDER — CEFTRIAXONE SODIUM 250 MG IJ SOLR
250.0000 mg | Freq: Once | INTRAMUSCULAR | Status: AC
  Administered 2016-02-04: 250 mg via INTRAMUSCULAR
  Filled 2016-02-04: qty 250

## 2016-02-04 MED ORDER — AZITHROMYCIN 250 MG PO TABS
1000.0000 mg | ORAL_TABLET | Freq: Once | ORAL | Status: AC
  Administered 2016-02-04: 1000 mg via ORAL
  Filled 2016-02-04: qty 4

## 2016-02-04 MED ORDER — STERILE WATER FOR INJECTION IJ SOLN
INTRAMUSCULAR | Status: AC
  Administered 2016-02-04: 10 mL
  Filled 2016-02-04: qty 10

## 2016-02-04 MED ORDER — METRONIDAZOLE 500 MG PO TABS
2000.0000 mg | ORAL_TABLET | Freq: Once | ORAL | Status: AC
  Administered 2016-02-04: 2000 mg via ORAL
  Filled 2016-02-04: qty 4

## 2016-02-04 NOTE — ED Triage Notes (Signed)
Pt. Stated, I've had some leakage from my penis since yesterday. I did have unprotected sex.

## 2016-02-04 NOTE — Discharge Instructions (Signed)
You have been tested for STDs. Some of these results are still pending. Any abnormalities will be called to you. You have been prophylactically treated for gonorrhea, Chlamydia, and Trichomonas. This does not mean you necessarily have these diseases, treatment is precautionary. Be sure to follow safe sex practices, including monogamy and/or condom use.

## 2016-02-04 NOTE — ED Provider Notes (Signed)
[facility] DEPT Provider Note   CSN: [id number] Arrival date & time: 02/04/16  7616  By signing my name below, I, Jerome Irwin, attest that this documentation has been prepared under the direction and in the presence of Jedrek Dinovo, PA-C. Electronically Signed: [name] , ED Scribe. 02/04/16. 1:29 PM.   History   Chief Complaint Chief Complaint  Patient presents with  . Penile Discharge  . Exposure to STD    HPI Comments: Jerome Irwin is a 36 y.o. male with hx of gonorrhea x15 years ago who presents to the Emergency Department complaining of yellow-green penile discharge with sudden onset x1 day. Pt states that he is sexually active with one partner without protection. Also endorses dysuria. He denies fever, vomiting, abdominal pain, painful bowel movements, or any other complaints.  The history is provided by the patient. No language interpreter was used.    Past Medical History:  Diagnosis Date  . Bronchitis   . Pneumonia   . Ulcerative colitis ([place])     There are no active problems to display for this patient.   History reviewed. No pertinent surgical history.     Home Medications    Prior to Admission medications   Medication Sig Start Date End Date Taking? Authorizing Provider  albuterol (PROVENTIL HFA;VENTOLIN HFA) 108 (90 Base) MCG/ACT inhaler Inhale 2 puffs into the lungs every 4 (four) hours as needed for wheezing or shortness of breath. 10/22/15   Merryl Hacker, MD  fluticasone (FLONASE) 50 MCG/ACT nasal spray Place 2 sprays into both nostrils daily. 10/22/15   Merryl Hacker, MD  polyvinyl alcohol (ARTIFICIAL TEARS) 1.4 % ophthalmic solution Place 1 drop into the right eye as needed for dry eyes. 11/13/15   Clayton Bibles, PA-C  predniSONE (DELTASONE) 20 MG tablet 2 tabs po daily x 4 days 01/19/16   Domenic Moras, PA-C  pseudoephedrine (SUDAFED) 60 MG tablet Take 1 tablet (60 mg total) by mouth every 6 (six) hours as needed for congestion.  11/16/15   Clayton Bibles, PA-C  sulfaSALAzine (AZULFIDINE) 500 MG tablet Take 1 tablet (500 mg total) by mouth 4 (four) times daily. 06/23/15   Jeannett Senior, PA-C    Family History No family history on file.  Social History Social History  Substance Use Topics  . Smoking status: Current Every Day Smoker    Packs/day: 0.50    Types: Cigarettes  . Smokeless tobacco: Never Used  . Alcohol use No     Allergies   Patient has no known allergies.   Review of Systems Review of Systems  Constitutional: Negative for chills and fever.  Gastrointestinal: Negative for abdominal pain, nausea and vomiting.  Genitourinary: Positive for discharge and dysuria.     Physical Exam Updated Vital Signs BP 140/96 (BP Location: Left Arm)   Pulse 83   Temp 98.7 F (37.1 C) (Oral)   Resp 17   SpO2 98%   Physical Exam  Constitutional: He appears well-developed and well-nourished. No distress.  HENT:  Head: Normocephalic and atraumatic.  Eyes: Conjunctivae are normal.  Neck: Neck supple.  Cardiovascular: Normal rate and regular rhythm.   Pulmonary/Chest: Effort normal. No respiratory distress.  Abdominal: Soft. There is no tenderness. There is no guarding.  Genitourinary:  Genitourinary Comments: Penis, scrotum, and testicles without swelling, lesions, or tenderness. No penile discharge noted. Cremasteric reflex intact. Overall normal male genitalia. Scribe, Linton Rump, served as Producer, television/film/video during the exam.  Musculoskeletal: He exhibits no edema.  Lymphadenopathy:    He has  no cervical adenopathy.       Right: No inguinal adenopathy present.       Left: No inguinal adenopathy present.  Neurological: He is alert.  Skin: Skin is warm and dry. He is not diaphoretic.  Psychiatric: He has a normal mood and affect. His behavior is normal.  Nursing note and vitals reviewed.    ED Treatments / Results   DIAGNOSTIC STUDIES: Oxygen Saturation is 98% on RA, normal by my interpretation.    COORDINATION OF CARE: 1:29 PM-Discussed next steps with pt. Pt verbalized understanding and is agreeable with the plan.    Labs (all labs ordered are listed, but only abnormal results are displayed) Labs Reviewed  URINALYSIS, ROUTINE W REFLEX MICROSCOPIC - Abnormal; Notable for the following:       Result Value   Color, Urine AMBER (*)    APPearance HAZY (*)    Hgb urine dipstick SMALL (*)    Leukocytes, UA SMALL (*)    All other components within normal limits  RPR  HIV ANTIBODY (ROUTINE TESTING)  GC/CHLAMYDIA PROBE AMP ([place]) NOT AT [facility]    EKG  EKG Interpretation None       Radiology No results found.  Procedures Procedures (including critical care time)  Medications Ordered in ED Medications  cefTRIAXone (ROCEPHIN) injection 250 mg (250 mg Intramuscular Given 02/04/16 1102)  azithromycin (ZITHROMAX) tablet 1,000 mg (1,000 mg Oral Given 02/04/16 1102)  metroNIDAZOLE (FLAGYL) tablet 2,000 mg (2,000 mg Oral Given 02/04/16 1103)  sterile water (preservative free) injection (10 mLs  Given 02/04/16 1102)     Initial Impression / Assessment and Plan / ED Course  I have reviewed the triage vital signs and the nursing notes.  Pertinent labs & imaging results that were available during my care of the patient were reviewed by me and considered in my medical decision making (see chart for details).  Clinical Course     Patient presents with penile discharge beginning yesterday. Treatment given for gonorrhea, chlamydia, and trichomonas. Patient counseled on safe sex practices. He was informed that any abnormal lab results will be called to him. Advised to go to the health Department for future STD testing or treatment.    Final Clinical Impressions(s) / ED Diagnoses   Final diagnoses:  Penile discharge    New Prescriptions Discharge Medication List as of 02/04/2016 10:50 AM     I personally performed the services described in this documentation, which was  scribed in my presence. The recorded information has been reviewed and is accurate.    Lorayne Bender, PA-C 02/04/16 [place], MD 02/06/16 [phone]

## 2016-02-05 LAB — GC/CHLAMYDIA PROBE AMP (~~LOC~~) NOT AT ARMC
Chlamydia: NEGATIVE
NEISSERIA GONORRHEA: POSITIVE — AB

## 2016-02-05 LAB — HIV ANTIBODY (ROUTINE TESTING W REFLEX): HIV SCREEN 4TH GENERATION: NONREACTIVE

## 2016-02-05 LAB — RPR: RPR: NONREACTIVE

## 2016-02-21 ENCOUNTER — Ambulatory Visit (HOSPITAL_COMMUNITY): Payer: Self-pay | Admitting: Certified Registered Nurse Anesthetist

## 2016-02-21 ENCOUNTER — Encounter (HOSPITAL_COMMUNITY): Payer: Self-pay | Admitting: *Deleted

## 2016-02-21 ENCOUNTER — Encounter (HOSPITAL_COMMUNITY)
Admission: RE | Disposition: A | Discharge status: Home / Self Care | Payer: Self-pay | Source: Ambulatory Visit | Attending: Gastroenterology

## 2016-02-21 ENCOUNTER — Ambulatory Visit (HOSPITAL_COMMUNITY)
Admission: RE | Admit: 2016-02-21 | Discharge: 2016-02-21 | Disposition: A | Discharge status: Home / Self Care | Payer: Self-pay | Source: Ambulatory Visit | Attending: Gastroenterology | Admitting: Gastroenterology

## 2016-02-21 DIAGNOSIS — Z87891 Personal history of nicotine dependence: Secondary | ICD-10-CM | POA: Insufficient documentation

## 2016-02-21 DIAGNOSIS — K648 Other hemorrhoids: Secondary | ICD-10-CM | POA: Insufficient documentation

## 2016-02-21 DIAGNOSIS — Z8711 Personal history of peptic ulcer disease: Secondary | ICD-10-CM | POA: Insufficient documentation

## 2016-02-21 DIAGNOSIS — Z8719 Personal history of other diseases of the digestive system: Secondary | ICD-10-CM | POA: Insufficient documentation

## 2016-02-21 DIAGNOSIS — K921 Melena: Secondary | ICD-10-CM | POA: Insufficient documentation

## 2016-02-21 DIAGNOSIS — R197 Diarrhea, unspecified: Secondary | ICD-10-CM | POA: Insufficient documentation

## 2016-02-21 HISTORY — PX: COLONOSCOPY WITH PROPOFOL: SHX5780

## 2016-02-21 SURGERY — COLONOSCOPY WITH PROPOFOL
Anesthesia: Monitor Anesthesia Care

## 2016-02-21 SURGERY — COLONOSCOPY WITH PROPOFOL
Anesthesia: Monitor Anesthesia Care | Laterality: Left

## 2016-02-21 MED ORDER — PROPOFOL 10 MG/ML IV BOLUS
INTRAVENOUS | Status: DC | PRN
  Administered 2016-02-21: 20 mg via INTRAVENOUS
  Administered 2016-02-21: 40 mg via INTRAVENOUS
  Administered 2016-02-21: 20 mg via INTRAVENOUS
  Administered 2016-02-21: 40 mg via INTRAVENOUS
  Administered 2016-02-21: 30 mg via INTRAVENOUS

## 2016-02-21 MED ORDER — ONDANSETRON HCL 4 MG/2ML IJ SOLN
INTRAMUSCULAR | Status: AC
  Filled 2016-02-21: qty 2

## 2016-02-21 MED ORDER — PROPOFOL 500 MG/50ML IV EMUL
INTRAVENOUS | Status: DC | PRN
  Administered 2016-02-21: 100 ug/kg/min via INTRAVENOUS

## 2016-02-21 MED ORDER — LIDOCAINE 2% (20 MG/ML) 5 ML SYRINGE
INTRAMUSCULAR | Status: AC
  Filled 2016-02-21: qty 5

## 2016-02-21 MED ORDER — LIDOCAINE 2% (20 MG/ML) 5 ML SYRINGE
INTRAMUSCULAR | Status: DC | PRN
  Administered 2016-02-21: 100 mg via INTRAVENOUS

## 2016-02-21 MED ORDER — LACTATED RINGERS IV SOLN
INTRAVENOUS | Status: DC
  Administered 2016-02-21: 1000 mL via INTRAVENOUS

## 2016-02-21 MED ORDER — ONDANSETRON HCL 4 MG/2ML IJ SOLN
INTRAMUSCULAR | Status: DC | PRN
  Administered 2016-02-21: 4 mg via INTRAVENOUS

## 2016-02-21 MED ORDER — PROPOFOL 10 MG/ML IV BOLUS
INTRAVENOUS | Status: AC
  Filled 2016-02-21: qty 20

## 2016-02-21 MED ORDER — SODIUM CHLORIDE 0.9 % IV SOLN: INTRAVENOUS | Status: DC

## 2016-02-21 MED ORDER — PROPOFOL 10 MG/ML IV BOLUS
INTRAVENOUS | Status: AC
  Filled 2016-02-21: qty 60

## 2016-02-21 SURGICAL SUPPLY — 21 items

## 2016-02-21 NOTE — Transfer of Care (Signed)
Immediate Anesthesia Transfer of Care Note  Patient: [facility] Irwin  Procedure(s) Performed: Procedure(s): COLONOSCOPY WITH PROPOFOL (N/A)  Patient Location: PACU  Anesthesia Type:MAC  Level of Consciousness:  sedated, patient cooperative and responds to stimulation  Airway & Oxygen Therapy:Patient Spontanous Breathing and Patient connected to face mask oxgen  Post-op Assessment:  Report given to PACU RN and Post -op Vital signs reviewed and stable  Post vital signs:  Reviewed and stable  Last Vitals:  Vitals:   02/21/16 0831  BP: (!) 135/95  Resp: 15  Temp: 03.7 C    Complications: No apparent anesthesia complications

## 2016-02-21 NOTE — Op Note (Signed)
[facility] Patient Name: [facility] Irwin Procedure Date: 02/21/2016 MRN: [id number] Attending MD: Arta Silence , MD Date of Birth: [birth year] CSN: [id number] Age: 36 Admit Type: Outpatient Procedure:                Colonoscopy Indications:              Last colonoscopy: 2009, Clinically significant                            diarrhea of unexplained origin, Hematochezia,                            Personal history of ulcerative colitis (diagnosed                            2003) Providers:                Arta Silence, MD, Laverta Baltimore RN, RN, Elspeth Cho Tech., Technician, Adair Laundry, CRNA Referring MD:              Medicines:                Monitored Anesthesia Care Complications:            No immediate complications. Estimated Blood Loss:     Estimated blood loss was minimal. Procedure:                Pre-Anesthesia Assessment:                           - Prior to the procedure, a History and Physical                            was performed, and patient medications and                            allergies were reviewed. The patient's tolerance of                            previous anesthesia was also reviewed. The risks                            and benefits of the procedure and the sedation                            options and risks were discussed with the patient.                            All questions were answered, and informed consent                            was obtained. Prior Anticoagulants: The patient has                            taken no previous anticoagulant  or antiplatelet                            agents. ASA Grade Assessment: II - A patient with                            mild systemic disease. After reviewing the risks                            and benefits, the patient was deemed in                            satisfactory condition to undergo the procedure.                           After  obtaining informed consent, the colonoscope                            was passed under direct vision. Throughout the                            procedure, the patient's blood pressure, pulse, and                            oxygen saturations were monitored continuously. The                            EC-[id number] ([id number]) scope was introduced through                            the anus and advanced to the the terminal ileum,                            with identification of the appendiceal orifice and                            IC valve. The terminal ileum, ileocecal valve,                            appendiceal orifice, and rectum were photographed.                            The entire colon was examined. The colonoscopy was                            performed without difficulty. The patient tolerated                            the procedure well. The quality of the bowel                            preparation was good. Scope In: 9:40:14 AM Scope Out: 1:47:82 AM Scope Withdrawal Time: 0 hours 12 minutes 39 seconds  Total Procedure Duration: 0 hours 16  minutes 44 seconds  Findings:      The perianal and digital rectal examinations were normal.      Internal hemorrhoids were found during retroflexion. The hemorrhoids       were moderate.      No additional abnormalities were found on retroflexion.      The colon (entire examined portion) appeared normal. Several biopsies       were obtained from the right colon (bottle 1) and left colon (bottle 2)       with cold forceps for ulcerative colitis surveillance. Estimated blood       loss was minimal.      The terminal ileum appeared normal. Impression:               - Internal hemorrhoids. Suspect this is source of                            patient's recent bleeding.                           - The entire examined colon is normal. No evidence                            of active colitis identified.                           - The examined  portion of the ileum was normal.                           - Several biopsies were obtained from the right                            colon (bottle 1) and left colon (bottle 2). Moderate Sedation:      None Recommendation:           - Patient has a contact number available for                            emergencies. The signs and symptoms of potential                            delayed complications were discussed with the                            patient. Return to normal activities tomorrow.                            Written discharge instructions were provided to the                            patient.                           - Discharge patient to home (via wheelchair).                           - Resume previous diet today.                           -  Continue present medications, including                            sulfasalazine for inflammatory bowel disease.                           - Topical therapies (e.g., Preparation-H),                            avoidance of constipation/straining, liberal water                            intake, for management of hemorrhoids.                           - Await pathology results.                           - Repeat colonoscopy in 3 years for surveillance                            based on pathology results.                           - Return to endoscopist in 3 months.                           - Return to referring physician as previously                            scheduled. Procedure Code(s):        --- Professional ---                           [phone], Colonoscopy, flexible; with biopsy, single                            or multiple Diagnosis Code(s):        --- Professional ---                           K64.8, Other hemorrhoids                           R19.7, Diarrhea, unspecified                           K92.1, Melena (includes Hematochezia)                           Z87.19, Personal history of other diseases of the                             digestive system CPT copyright 2016 [employer] Medical Association. All rights reserved. The codes documented in this report are preliminary and upon coder review may  be revised to meet current compliance requirements. Arta Silence, MD 02/21/2016 10:05:26 AM This report has been signed electronically. Number  of Addenda: 0

## 2016-02-21 NOTE — Discharge Instructions (Signed)

## 2016-02-21 NOTE — Anesthesia Preprocedure Evaluation (Signed)
Anesthesia Evaluation  Patient identified by MRN, date of birth, ID band Patient awake    Reviewed: Allergy & Precautions, NPO status , Patient's Chart, lab work & pertinent test results  Airway Mallampati: II  TM Distance: >3 FB Neck ROM: Full    Dental no notable dental hx.    Pulmonary Current Smoker,    Pulmonary exam normal breath sounds clear to auscultation       Cardiovascular negative cardio ROS Normal cardiovascular exam Rhythm:Regular Rate:Normal     Neuro/Psych negative neurological ROS  negative psych ROS   GI/Hepatic negative GI ROS, Neg liver ROS,   Endo/Other  negative endocrine ROS  Renal/GU negative Renal ROS  negative genitourinary   Musculoskeletal negative musculoskeletal ROS (+)   Abdominal   Peds negative pediatric ROS (+)  Hematology negative hematology ROS (+)   Anesthesia Other Findings   Reproductive/Obstetrics negative OB ROS                             Anesthesia Physical Anesthesia Plan  ASA: II  Anesthesia Plan: MAC   Post-op Pain Management:    Induction: Intravenous  Airway Management Planned: Simple Face Mask  Additional Equipment:   Intra-op Plan:   Post-operative Plan:   Informed Consent: I have reviewed the patients History and Physical, chart, labs and discussed the procedure including the risks, benefits and alternatives for the proposed anesthesia with the patient or authorized representative who has indicated his/her understanding and acceptance.   Dental advisory given  Plan Discussed with: CRNA and Surgeon  Anesthesia Plan Comments:         Anesthesia Quick Evaluation

## 2016-02-21 NOTE — H&P (Signed)
Patient interval history reviewed.  Patient examined again.  There has been no change from documented H/P dated 02/02/16 (scanned into chart from our office) except as documented above.  Assessment:  1.  Bloody diarrhea. 2.  Personal history of ulcerative colitis, diagnosed 2003.  Plan:  1.  Colonoscopy. 2.  Risks (bleeding, infection, bowel perforation that could require surgery, sedation-related changes in cardiopulmonary systems), benefits (identification and possible treatment of source of symptoms, exclusion of certain causes of symptoms), and alternatives (watchful waiting, radiographic imaging studies, empiric medical treatment) of colonoscopy were explained to patient/family in detail and patient wishes to proceed.

## 2016-02-22 ENCOUNTER — Encounter (HOSPITAL_COMMUNITY): Payer: Self-pay | Admitting: Gastroenterology

## 2016-02-22 NOTE — Anesthesia Postprocedure Evaluation (Addendum)
Anesthesia Post Note  Patient: Jerome Irwin  Procedure(s) Performed: Procedure(s) (LRB): COLONOSCOPY WITH PROPOFOL (N/A)  Patient location during evaluation: PACU Anesthesia Type: MAC Level of consciousness: awake and alert Pain management: pain level controlled Vital Signs Assessment: post-procedure vital signs reviewed and stable Respiratory status: spontaneous breathing, nonlabored ventilation, respiratory function stable and patient connected to nasal cannula oxygen Cardiovascular status: stable and blood pressure returned to baseline Anesthetic complications: no       Last Vitals:  Vitals:   02/21/16 1015 02/21/16 1020  BP:  (!) 136/95  Pulse: 65 69  Resp: 16 17  Temp:      Last Pain:  Vitals:   02/21/16 1005  TempSrc: Oral                 Yarethzy Croak S

## 2016-03-22 ENCOUNTER — Emergency Department (HOSPITAL_COMMUNITY)
Admission: EM | Admit: 2016-03-22 | Discharge: 2016-03-22 | Disposition: A | Discharge status: Home / Self Care | Payer: Self-pay | Attending: Emergency Medicine | Admitting: Emergency Medicine

## 2016-03-22 ENCOUNTER — Encounter (HOSPITAL_COMMUNITY): Payer: Self-pay

## 2016-03-22 ENCOUNTER — Emergency Department (HOSPITAL_COMMUNITY): Payer: Self-pay

## 2016-03-22 DIAGNOSIS — J069 Acute upper respiratory infection, unspecified: Secondary | ICD-10-CM | POA: Insufficient documentation

## 2016-03-22 DIAGNOSIS — Z87891 Personal history of nicotine dependence: Secondary | ICD-10-CM | POA: Insufficient documentation

## 2016-03-22 MED ORDER — DM-GUAIFENESIN ER 30-600 MG PO TB12: 1.0000 | ORAL_TABLET | Freq: Two times a day (BID) | ORAL | 0 refills | Status: DC

## 2016-03-22 NOTE — Discharge Instructions (Signed)
Please read and follow all provided instructions.  Your diagnoses today include:  1. Upper respiratory tract infection, unspecified type     Tests performed today include: Vital signs. See below for your results today.   Medications prescribed:  Take as prescribed   Home care instructions:  Follow any educational materials contained in this packet.  Follow-up instructions: Please follow-up with your primary care provider for further evaluation of symptoms and treatment   Return instructions:  Please return to the Emergency Department if you do not get better, if you get worse, or new symptoms OR  - Fever (temperature greater than 101.66F)  - Bleeding that does not stop with holding pressure to the area    -Severe pain (please note that you may be more sore the day after your accident)  - Chest Pain  - Difficulty breathing  - Severe nausea or vomiting  - Inability to tolerate food and liquids  - Passing out  - Skin becoming red around your wounds  - Change in mental status (confusion or lethargy)  - New numbness or weakness    Please return if you have any other emergent concerns.  Additional Information:  Your vital signs today were: BP 122/83 (BP Location: Left Arm)    Pulse 65    Temp 98.3 F (36.8 C) (Oral)    Resp 17    Ht 5' 11"  (1.803 m)    Wt 86.2 kg    SpO2 98%    BMI 26.50 kg/m  If your blood pressure (BP) was elevated above 135/85 this visit, please have this repeated by your doctor within one month. ---------------

## 2016-03-22 NOTE — ED Notes (Signed)
Pt states that he is breathing better since coming in.

## 2016-03-22 NOTE — ED Triage Notes (Signed)
Per Pt, Pt is coming from home with complaints of SOB. Pt reports having HX of bronchitis. Lungs are clear upon assessment. Pt denies cough.

## 2016-03-22 NOTE — ED Notes (Signed)
To x-ray

## 2016-03-22 NOTE — ED Provider Notes (Signed)
[facility] DEPT Provider Note   CSN: [id number] Arrival date & time: 03/22/16  1147     History   Chief Complaint Chief Complaint  Patient presents with  . Shortness of Breath    HPI Jerome Irwin is a 36 y.o. male.  HPI  36 y.o. male with a hx of Bronchitis, presents to the Emergency Department today complaining of SOB since Wednesday. Notes occurring after smoking marijuana and doing cocaine. No active CP then or currently. No N/V. No diaphoresis. Notes mild URI symptoms with rhinorrhea, congestion, mild cough. No fevers at home. No sick contacts. No abdominal pain. No pain currently. No other symptoms noted.    Past Medical History:  Diagnosis Date  . Bronchitis   . Pneumonia   . Ulcerative colitis ([place])     There are no active problems to display for this patient.   Past Surgical History:  Procedure Laterality Date  . COLONOSCOPY WITH PROPOFOL N/A 02/21/2016   Procedure: COLONOSCOPY WITH PROPOFOL;  Surgeon: Arta Silence, MD;  Location: [facility] ENDOSCOPY;  Service: Endoscopy;  Laterality: N/A;       Home Medications    Prior to Admission medications   Medication Sig Start Date End Date Taking? Authorizing Provider  albuterol (PROVENTIL HFA;VENTOLIN HFA) 108 (90 Base) MCG/ACT inhaler Inhale 2 puffs into the lungs every 4 (four) hours as needed for wheezing or shortness of breath. 10/22/15   Merryl Hacker, MD  Multiple Vitamins-Minerals (MULTIVITAMIN WITH MINERALS) tablet Take 1 tablet by mouth daily.    Historical Provider, MD  sulfaSALAzine (AZULFIDINE) 500 MG tablet Take 1 tablet (500 mg total) by mouth 4 (four) times daily. Patient taking differently: Take 1,000 mg by mouth 4 (four) times daily.  06/23/15   Jeannett Senior, PA-C    Family History No family history on file.  Social History Social History  Substance Use Topics  . Smoking status: Former Smoker    Packs/day: 0.50    Types: Cigarettes  . Smokeless tobacco: Never Used  . Alcohol use  No     Allergies   Patient has no known allergies.   Review of Systems Review of Systems ROS reviewed and all are negative for acute change except as noted in the HPI.  Physical Exam Updated Vital Signs BP 147/94   Pulse 80   Temp 98.3 F (36.8 C) (Oral)   Resp 19   Ht 5' 11"  (1.803 m)   Wt 86.2 kg   SpO2 97%   BMI 26.50 kg/m   Physical Exam  Constitutional: He is oriented to person, place, and time. Vital signs are normal. He appears well-developed and well-nourished. No distress.  HENT:  Head: Normocephalic and atraumatic.  Right Ear: Hearing, tympanic membrane, external ear and ear canal normal.  Left Ear: Hearing, tympanic membrane, external ear and ear canal normal.  Nose: Nose normal.  Mouth/Throat: Uvula is midline, oropharynx is clear and moist and mucous membranes are normal. No trismus in the jaw. No oropharyngeal exudate, posterior oropharyngeal erythema or tonsillar abscesses.  Eyes: Conjunctivae and EOM are normal. Pupils are equal, round, and reactive to light.  Neck: Normal range of motion. Neck supple. No tracheal deviation present.  Cardiovascular: Normal rate, regular rhythm, S1 normal, S2 normal, normal heart sounds, intact distal pulses and normal pulses.   Pulmonary/Chest: Effort normal and breath sounds normal. No respiratory distress. He has no decreased breath sounds. He has no wheezes. He has no rhonchi. He has no rales.  Abdominal: Soft. Normal appearance and  bowel sounds are normal. There is no tenderness.  Musculoskeletal: Normal range of motion.  Neurological: He is alert and oriented to person, place, and time.  Skin: Skin is warm and dry.  Psychiatric: He has a normal mood and affect. His speech is normal and behavior is normal. Thought content normal.  Nursing note and vitals reviewed.  ED Treatments / Results  Labs (all labs ordered are listed, but only abnormal results are displayed) Labs Reviewed - No data to display  EKG  EKG  Interpretation None       Radiology Dg Chest 2 View  Result Date: 03/22/2016 CLINICAL DATA:  Shortness of Breath EXAM: CHEST  2 VIEW COMPARISON:  01/19/2016 FINDINGS: Cardiomediastinal silhouette is stable. No infiltrate or pleural effusion. No pulmonary edema. Bony thorax is unremarkable. IMPRESSION: No active cardiopulmonary disease. Electronically Signed   By: Lahoma Crocker M.D.   On: 03/22/2016 12:24    Procedures Procedures (including critical care time)  Medications Ordered in ED Medications - No data to display   Initial Impression / Assessment and Plan / ED Course  I have reviewed the triage vital signs and the nursing notes.  Pertinent labs & imaging results that were available during my care of the patient were reviewed by me and considered in my medical decision making (see chart for details).  Final Clinical Impressions(s) / ED Diagnoses   {I have reviewed and evaluated the relevant imaging studies.  {I have reviewed the relevant previous healthcare records.  {I obtained HPI from historian.   ED Course:  Assessment: Pt is a 36yM presents with URI symptoms since Wednesday. No CP. Mild SOB that has since resolved in ED . On exam, pt in NAD. VSS. Afebrile. Lungs CTA, Heart RRR. Abdomen nontender/soft. Pt CXR negative for acute infiltrate. Patients symptoms are consistent with URI, likely viral etiology. Discussed that antibiotics are not indicated for viral infections. Pt will be discharged with symptomatic treatment.  Verbalizes understanding and is agreeable with plan. Pt is hemodynamically stable & in NAD prior to dc  Disposition/Plan:  DC Home Additional Verbal discharge instructions given and discussed with patient.  Pt Instructed to f/u with PCP in the next week for evaluation and treatment of symptoms. Return precautions given Pt acknowledges and agrees with plan  Supervising Physician Gwenyth Allegra Tegeler, MD  Final diagnoses:  Upper respiratory tract  infection, unspecified type    New Prescriptions New Prescriptions   No medications on file     Shary Decamp, PA-C 03/22/16 [place], MD 03/22/16 2033

## 2016-07-01 NOTE — Addendum Note (Signed)
Addendum  created 07/01/16 1020 by Myrtie Soman, MD   Sign clinical note

## 2016-07-16 ENCOUNTER — Emergency Department (HOSPITAL_COMMUNITY)
Admission: EM | Admit: 2016-07-16 | Discharge: 2016-07-16 | Disposition: A | Discharge status: Home / Self Care | Payer: Self-pay | Attending: Emergency Medicine | Admitting: Emergency Medicine

## 2016-07-16 ENCOUNTER — Encounter (HOSPITAL_COMMUNITY): Payer: Self-pay | Admitting: *Deleted

## 2016-07-16 DIAGNOSIS — M62838 Other muscle spasm: Secondary | ICD-10-CM

## 2016-07-16 DIAGNOSIS — Z87891 Personal history of nicotine dependence: Secondary | ICD-10-CM | POA: Insufficient documentation

## 2016-07-16 DIAGNOSIS — M62830 Muscle spasm of back: Secondary | ICD-10-CM | POA: Insufficient documentation

## 2016-07-16 MED ORDER — CYCLOBENZAPRINE HCL 10 MG PO TABS: 10.0000 mg | ORAL_TABLET | Freq: Every evening | ORAL | 0 refills | Status: DC | PRN

## 2016-07-16 NOTE — ED Provider Notes (Signed)
[facility] DEPT Provider Note   CSN: [id number] Arrival date & time: 07/16/16  5176  By signing my name below, I, Jeanell Sparrow, attest that this documentation has been prepared under the direction and in the presence of non-physician practitioner, Martinique Russo, PA-C. Electronically Signed: Jeanell Sparrow, Scribe. 07/16/2016. 9:57 AM.  History   Chief Complaint Chief Complaint  Patient presents with  . Back Pain   The history is provided by the patient. No language interpreter was used.   HPI Comments: Jerome Irwin is a 36 y.o. male with a PMHx of ulcerative colitis who presents to the Emergency Department complaining of acute onset, constant moderate right-sided upper back pain that started about a week ago. Patient reports he woke up one morning and noticed the pain. No known injury. His pain is unrelieved by hydrocodone and OTC muscle rub. His pain is exacerbated by certain movements and deep breaths. Denies any prior hx of similar pain, SOB, cough, numbness/tingling, weakness, or other complaints at this time.  Past Medical History:  Diagnosis Date  . Bronchitis   . Pneumonia   . Ulcerative colitis ([place])     There are no active problems to display for this patient.   Past Surgical History:  Procedure Laterality Date  . COLONOSCOPY WITH PROPOFOL N/A 02/21/2016   Procedure: COLONOSCOPY WITH PROPOFOL;  Surgeon: Arta Silence, MD;  Location: [facility];  Service: Endoscopy;  Laterality: N/A;       Home Medications    Prior to Admission medications   Medication Sig Start Date End Date Taking? Authorizing Provider  albuterol (PROVENTIL HFA;VENTOLIN HFA) 108 (90 Base) MCG/ACT inhaler Inhale 2 puffs into the lungs every 4 (four) hours as needed for wheezing or shortness of breath. 10/22/15   Horton, Barbette Hair, MD  cyclobenzaprine (FLEXERIL) 10 MG tablet Take 1 tablet (10 mg total) by mouth at bedtime as needed for muscle spasms. 07/16/16   Russo, Martinique N, PA-C    dextromethorphan-guaiFENesin [facility] DM) 30-600 MG 12hr tablet Take 1 tablet by mouth 2 (two) times daily. 03/22/16   Shary Decamp, PA-C  Multiple Vitamins-Minerals (MULTIVITAMIN WITH MINERALS) tablet Take 1 tablet by mouth daily.    [provider]  sulfaSALAzine (AZULFIDINE) 500 MG tablet Take 1 tablet (500 mg total) by mouth 4 (four) times daily. Patient taking differently: Take 1,000 mg by mouth 4 (four) times daily.  06/23/15   Jeannett Senior, PA-C    Family History No family history on file.  Social History Social History  Substance Use Topics  . Smoking status: Former Smoker    Packs/day: 0.50    Types: Cigarettes  . Smokeless tobacco: Never Used  . Alcohol use No     Allergies   Patient has no known allergies.   Review of Systems Review of Systems  Constitutional: Negative for fever.  Respiratory: Negative for cough and shortness of breath.   Cardiovascular: Negative for chest pain.  Gastrointestinal:       No bowel incontinence  Genitourinary: Negative for difficulty urinating.  Musculoskeletal: Positive for back pain (upper) and myalgias.  Neurological: Negative for weakness and numbness.     Physical Exam Updated Vital Signs BP (!) 141/98 (BP Location: Right Arm)   Pulse 76   Temp 98.4 F (36.9 C) (Oral)   Resp 16   SpO2 96%   Physical Exam  Constitutional: He appears well-developed and well-nourished. No distress.  HENT:  Head: Normocephalic and atraumatic.  Eyes: Conjunctivae are normal.  Neck: Normal range of motion.  Cardiovascular: Normal rate.   Pulmonary/Chest: Effort normal and breath sounds normal. No respiratory distress. He has no wheezes. He has no rales. He exhibits no tenderness.  Musculoskeletal: Normal range of motion.  Spine and paraspinal musculature non-tender.  Right base of the trapezius is tender w small muscle spasm palpated. Normal ROM of bilateral shoulders and arms. Neck w nl ROM.   Neurological:  5/5  strength in the BUE and BLE. Normal sensation. Normal gait  Psychiatric: He has a normal mood and affect. His behavior is normal.  Nursing note and vitals reviewed.    ED Treatments / Results  DIAGNOSTIC STUDIES: Oxygen Saturation is 96% on RA, normal by my interpretation.    COORDINATION OF CARE: 10:02 AM- Pt advised of plan for treatment and pt agrees.  Labs (all labs ordered are listed, but only abnormal results are displayed) Labs Reviewed - No data to display  EKG  EKG Interpretation None       Radiology No results found.  Procedures Procedures (including critical care time)  Medications Ordered in ED Medications - No data to display   Initial Impression / Assessment and Plan / ED Course  I have reviewed the triage vital signs and the nursing notes.  Pertinent labs & imaging results that were available during my care of the patient were reviewed by me and considered in my medical decision making (see chart for details).     Patient with muscle spasm.  No inciting injury. No neurological deficits and normal neuro exam. Exam unconcerning for cord or spine injury. Lungs CTAB. Patient can walk without diffficulty. RICE protocol and muscle relaxers indicated and discussed with patient. Pt is well-appearing and safe for discharge.  Discussed results, findings, treatment and follow up. Patient advised of return precautions. Patient verbalized understanding and agreed with plan.   Final Clinical Impressions(s) / ED Diagnoses   Final diagnoses:  Muscle spasm    New Prescriptions New Prescriptions   CYCLOBENZAPRINE (FLEXERIL) 10 MG TABLET    Take 1 tablet (10 mg total) by mouth at bedtime as needed for muscle spasms.   I personally performed the services described in this documentation, which was scribed in my presence. The recorded information has been reviewed and is accurate.     Russo, Martinique N, PA-C 07/16/16 1730    Quintella Reichert, MD 07/17/16  1043

## 2016-07-16 NOTE — ED Triage Notes (Signed)
To ED for eval of upper back pain/shoulder pain for past week. Pt is worse in am and with movement. Pt states he can sometimes find a position of comfort but pain with any movement. No injury

## 2016-07-16 NOTE — Discharge Instructions (Signed)
Please read instructions below. Drink plenty of water. Apply heat to your back for 20 minutes at a time, followed by gentle massage and stretching. You can take tylenol every 6 hours as needed for pain. You can take flexeril, the muscle relaxer, at bedtime as needed for muscle spasm. Do not drink alcohol or drive when taking this medication. Schedule an appointment with your primary care provider to follow-up if your symptoms do not resolve. Return to the ER for new or concerning symptoms.

## 2016-09-13 ENCOUNTER — Emergency Department (HOSPITAL_COMMUNITY): Admission: EM | Admit: 2016-09-13 | Discharge: 2016-09-13 | Discharge status: Against Medical Advice | Payer: Self-pay

## 2016-09-13 NOTE — ED Notes (Signed)
Call no answer 

## 2016-09-13 NOTE — ED Notes (Signed)
No answer when called for triage 

## 2016-09-21 ENCOUNTER — Encounter (HOSPITAL_COMMUNITY): Payer: Self-pay | Admitting: Emergency Medicine

## 2016-09-21 ENCOUNTER — Emergency Department (HOSPITAL_COMMUNITY)
Admission: EM | Admit: 2016-09-21 | Discharge: 2016-09-21 | Disposition: A | Discharge status: Home / Self Care | Payer: Self-pay | Attending: Emergency Medicine | Admitting: Emergency Medicine

## 2016-09-21 DIAGNOSIS — R5383 Other fatigue: Secondary | ICD-10-CM | POA: Insufficient documentation

## 2016-09-21 DIAGNOSIS — Z79899 Other long term (current) drug therapy: Secondary | ICD-10-CM | POA: Insufficient documentation

## 2016-09-21 DIAGNOSIS — K51911 Ulcerative colitis, unspecified with rectal bleeding: Secondary | ICD-10-CM | POA: Insufficient documentation

## 2016-09-21 DIAGNOSIS — Z87891 Personal history of nicotine dependence: Secondary | ICD-10-CM | POA: Insufficient documentation

## 2016-09-21 DIAGNOSIS — R42 Dizziness and giddiness: Secondary | ICD-10-CM | POA: Insufficient documentation

## 2016-09-21 LAB — LIPASE, BLOOD: LIPASE: 78 U/L — AB (ref 11–51)

## 2016-09-21 LAB — CBC
HEMATOCRIT: 36.7 % — AB (ref 39.0–52.0)
HEMOGLOBIN: 12 g/dL — AB (ref 13.0–17.0)
MCH: 27.6 pg (ref 26.0–34.0)
MCHC: 32.7 g/dL (ref 30.0–36.0)
MCV: 84.6 fL (ref 78.0–100.0)
PLATELETS: 321 10*3/uL (ref 150–400)
RBC: 4.34 MIL/uL (ref 4.22–5.81)
RDW: 14.9 % (ref 11.5–15.5)
WBC: 5.9 10*3/uL (ref 4.0–10.5)

## 2016-09-21 LAB — COMPREHENSIVE METABOLIC PANEL
ALT: 14 U/L — ABNORMAL LOW (ref 17–63)
ANION GAP: 8 (ref 5–15)
AST: 21 U/L (ref 15–41)
Albumin: 3.2 g/dL — ABNORMAL LOW (ref 3.5–5.0)
Alkaline Phosphatase: 81 U/L (ref 38–126)
BUN: 9 mg/dL (ref 6–20)
CO2: 28 mmol/L (ref 22–32)
CREATININE: 1.05 mg/dL (ref 0.61–1.24)
Calcium: 9.1 mg/dL (ref 8.9–10.3)
Chloride: 104 mmol/L (ref 101–111)
Glucose, Bld: 100 mg/dL — ABNORMAL HIGH (ref 65–99)
POTASSIUM: 3.4 mmol/L — AB (ref 3.5–5.1)
SODIUM: 140 mmol/L (ref 135–145)
Total Bilirubin: 0.5 mg/dL (ref 0.3–1.2)
Total Protein: 6.8 g/dL (ref 6.5–8.1)

## 2016-09-21 LAB — SAMPLE TO BLOOD BANK

## 2016-09-21 LAB — POC OCCULT BLOOD, ED: FECAL OCCULT BLD: POSITIVE — AB

## 2016-09-21 MED ORDER — HYDROCORTISONE 100 MG/60ML RE ENEM: 1.0000 | ENEMA | Freq: Every day | RECTAL | 0 refills | Status: DC

## 2016-09-21 MED ORDER — PREDNISONE 50 MG PO TABS: ORAL_TABLET | ORAL | 0 refills | Status: DC

## 2016-09-21 MED ORDER — PREDNISONE 20 MG PO TABS
60.0000 mg | ORAL_TABLET | Freq: Once | ORAL | Status: AC
  Administered 2016-09-21: 60 mg via ORAL
  Filled 2016-09-21: qty 3

## 2016-09-21 NOTE — ED Provider Notes (Signed)
[facility] DEPT Provider Note   CSN: [id number] Arrival date & time: 09/21/16  0454     History   Chief Complaint No chief complaint on file.  HPI  Blood pressure 126/86, pulse 78, temperature 98.2 F (36.8 C), temperature source Oral, resp. rate 18, SpO2 96 %.  Jerome Irwin is a 36 y.o. male complaining of Bloody diarrhea onset 1 month ago worsening over the course of the last 2 weeks. He has associated lightheadedness, fatigue with no chest pain, shortness of breath, palpitations, syncope. He states that he feels sometimes he has to strain to use the restroom however the stool is very loose.   Past Medical History:  Diagnosis Date  . Bronchitis   . Pneumonia   . Ulcerative colitis ([place])     There are no active problems to display for this patient.   Past Surgical History:  Procedure Laterality Date  . COLONOSCOPY WITH PROPOFOL N/A 02/21/2016   Procedure: COLONOSCOPY WITH PROPOFOL;  Surgeon: Arta Silence, MD;  Location: [facility];  Service: Endoscopy;  Laterality: N/A;       Home Medications    Prior to Admission medications   Medication Sig Start Date End Date Taking? Authorizing Provider  albuterol (PROVENTIL HFA;VENTOLIN HFA) 108 (90 Base) MCG/ACT inhaler Inhale 2 puffs into the lungs every 4 (four) hours as needed for wheezing or shortness of breath. 10/22/15  Yes Horton, Barbette Hair, MD  Multiple Vitamins-Minerals (MULTIVITAMIN WITH MINERALS) tablet Take 1 tablet by mouth daily.   Yes [provider]  sulfaSALAzine (AZULFIDINE) 500 MG tablet Take 1 tablet (500 mg total) by mouth 4 (four) times daily. Patient taking differently: Take 1,000 mg by mouth 3 (three) times daily.  06/23/15  Yes Kirichenko, Tatyana, PA-C  cyclobenzaprine (FLEXERIL) 10 MG tablet Take 1 tablet (10 mg total) by mouth at bedtime as needed for muscle spasms. Patient not taking: Reported on 09/21/2016 07/16/16   Russo, Martin[place] N, PA-C  dextromethorphan-guaiFENesin [facility] DM)  30-600 MG 12hr tablet Take 1 tablet by mouth 2 (two) times daily. Patient not taking: Reported on 09/21/2016 03/22/16   Shary Decamp, PA-C  hydrocortisone [facility]) 100 MG/60ML enema Place 1 enema (100 mg total) rectally at bedtime. 09/21/16 09/28/16  Rogan Ecklund, Elmyra Ricks, PA-C  predniSONE (DELTASONE) 50 MG tablet Take 1 tablet daily with breakfast 09/21/16   Linsey Arteaga, Charna Elizabeth    Family History History reviewed. No pertinent family history.  Social History Social History  Substance Use Topics  . Smoking status: Former Smoker    Packs/day: 0.50    Types: Cigarettes  . Smokeless tobacco: Never Used  . Alcohol use No     Allergies   Patient has no known allergies.   Review of Systems Review of Systems  A complete review of systems was obtained and all systems are negative except as noted in the HPI and PMH.    Physical Exam Updated Vital Signs BP 126/86   Pulse 78   Temp 98.2 F (36.8 C) (Oral)   Resp 18   SpO2 96%   Physical Exam  Constitutional: He is oriented to person, place, and time. He appears well-developed and well-nourished. No distress.  HENT:  Head: Normocephalic and atraumatic.  Mouth/Throat: Oropharynx is clear and moist.  No significant conjunctival pallor  Eyes: Pupils are equal, round, and reactive to light. Conjunctivae and EOM are normal.  Neck: Normal range of motion.  Cardiovascular: Normal rate, regular rhythm and intact distal pulses.   Pulmonary/Chest: Effort normal and breath sounds normal.  Abdominal: Soft. There is no tenderness.  Genitourinary:  Genitourinary Comments: Digital rectal exam a chaperoned by RN: No fissures, lesions. He has normally colored stool on the exterior of the rectum, normal rectal tone, normal stool color  Musculoskeletal: Normal range of motion.  Neurological: He is alert and oriented to person, place, and time.  Skin: He is not diaphoretic.  Psychiatric: He has a normal mood and affect.  Nursing note and vitals  reviewed.    ED Treatments / Results  Labs (all labs ordered are listed, but only abnormal results are displayed) Labs Reviewed  LIPASE, BLOOD - Abnormal; Notable for the following:       Result Value   Lipase 78 (*)    All other components within normal limits  COMPREHENSIVE METABOLIC PANEL - Abnormal; Notable for the following:    Potassium 3.4 (*)    Glucose, Bld 100 (*)    Albumin 3.2 (*)    ALT 14 (*)    All other components within normal limits  CBC - Abnormal; Notable for the following:    Hemoglobin 12.0 (*)    HCT 36.7 (*)    All other components within normal limits  POC OCCULT BLOOD, ED - Abnormal; Notable for the following:    Fecal Occult Bld POSITIVE (*)    All other components within normal limits  URINALYSIS, ROUTINE W REFLEX MICROSCOPIC  SAMPLE TO BLOOD BANK    EKG  EKG Interpretation None       Radiology No results found.  Procedures Procedures (including critical care time)  Medications Ordered in ED Medications  predniSONE (DELTASONE) tablet 60 mg (60 mg Oral Given 09/21/16 0932)     Initial Impression / Assessment and Plan / ED Course  I have reviewed the triage vital signs and the nursing notes.  Pertinent labs & imaging results that were available during my care of the patient were reviewed by me and considered in my medical decision making (see chart for details).     Vitals:   09/21/16 0511 09/21/16 0902  BP: 114/83 126/86  Pulse: 62 78  Resp: 16 18  Temp: 98.2 F (36.8 C)   TempSrc: Oral   SpO2: 94% 96%    Medications  predniSONE (DELTASONE) tablet 60 mg (60 mg Oral Given 09/21/16 0932)    Jerome Irwin is 36 y.o. male presenting with exacerbation of ulcerative colitis, bloody diarrhea onset 1 month ago. Patient is not anemic, guaiac positive. He's taking sulfasalazine regularly. He does not normally follow with GI. Primary care is writing prescription for ulcerative colitis meds. Patient given prednisone in the  ED.  No anemia on CBC, guaiac positive. Patient will be started on prednisone burst.  Discussed case with attending physician who agrees with care plan and disposition.   Evaluation does not show pathology that would require ongoing emergent intervention or inpatient treatment. Pt is hemodynamically stable and mentating appropriately. Discussed findings and plan with patient/guardian, who agrees with care plan. All questions answered. Return precautions discussed and outpatient follow up given.   Final Clinical Impressions(s) / ED Diagnoses   Final diagnoses:  Ulcerative colitis with rectal bleeding, unspecified location [facility])    New Prescriptions New Prescriptions   HYDROCORTISONE (CORTENEMA) 100 MG/60ML ENEMA    Place 1 enema (100 mg total) rectally at bedtime.   PREDNISONE (DELTASONE) 50 MG TABLET    Take 1 tablet daily with breakfast     Rylie Limburg, Charna Elizabeth 09/21/16 1050    Lacretia Leigh, MD 09/21/16  1320  

## 2016-09-21 NOTE — ED Triage Notes (Signed)
Pt reports hx ulcerative colitis, states rectal bleeding for over one week. States weakness

## 2016-09-21 NOTE — Discharge Instructions (Signed)
Please follow with your primary care doctor in the next 2 days for a check-up. They must obtain records for further management.  ° °Do not hesitate to return to the Emergency Department for any new, worsening or concerning symptoms.  ° °

## 2016-11-23 ENCOUNTER — Encounter (HOSPITAL_COMMUNITY): Payer: Self-pay | Admitting: Emergency Medicine

## 2016-11-23 ENCOUNTER — Emergency Department (HOSPITAL_COMMUNITY)
Admission: EM | Admit: 2016-11-23 | Discharge: 2016-11-23 | Disposition: A | Discharge status: Home / Self Care | Payer: Self-pay | Attending: Emergency Medicine | Admitting: Emergency Medicine

## 2016-11-23 DIAGNOSIS — Z87891 Personal history of nicotine dependence: Secondary | ICD-10-CM | POA: Insufficient documentation

## 2016-11-23 DIAGNOSIS — L02214 Cutaneous abscess of groin: Secondary | ICD-10-CM | POA: Insufficient documentation

## 2016-11-23 DIAGNOSIS — L0291 Cutaneous abscess, unspecified: Secondary | ICD-10-CM

## 2016-11-23 DIAGNOSIS — Z79899 Other long term (current) drug therapy: Secondary | ICD-10-CM | POA: Insufficient documentation

## 2016-11-23 NOTE — ED Triage Notes (Signed)
Per pt, states left groin pain that started yesterday-states he doesn't know if he was bit by something-has not lifted anything heavy

## 2016-11-23 NOTE — ED Notes (Signed)
Pt has red bump about 2-3 inches above penis mid groin. Pt stated he has 2 partners pt is sexually active with. 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 Pt denies SOB and Chest Pain.

## 2016-11-23 NOTE — ED Notes (Signed)
ED Provider at bedside. 

## 2016-11-23 NOTE — ED Provider Notes (Signed)
[facility] DEPT Provider Note   CSN: [id number] Arrival date & time: 11/23/16  1635     History   Chief Complaint Chief Complaint  Jerome Irwin presents with  . Jerome Irwin Pain    Jerome Irwin is a 36 y.o. male.  The history is provided by the Jerome Irwin.  Abscess  Location:  Pelvis Pelvic abscess location:  Jerome Irwin Size:  Size of a pea Abscess quality: induration, painful and redness   Red streaking: no   Duration:  1 day Progression:  Worsening Pain details:    Quality:  Throbbing and shooting   Severity:  Mild   Duration:  1 day   Timing:  Constant   Progression:  Worsening Chronicity:  New Context: not diabetes, not immunosuppression and not skin injury   Relieved by:  None tried Exacerbated by: palpation. Ineffective treatments:  None tried Associated symptoms: no fever, no nausea and no vomiting   Risk factors: no hx of MRSA and no prior abscess     Past Medical History:  Diagnosis Date  . Bronchitis   . Pneumonia   . Ulcerative colitis ([employer])     There are no active problems to display for this Jerome Irwin.   Past Surgical History:  Procedure Laterality Date  . COLONOSCOPY WITH PROPOFOL N/A 02/21/2016   Procedure: COLONOSCOPY WITH PROPOFOL;  Surgeon: Arta Silence, MD;  Location: [facility];  Service: Endoscopy;  Laterality: N/A;       Home Medications    Prior to Admission medications   Medication Sig Start Date End Date Taking? Authorizing Provider  albuterol (PROVENTIL HFA;VENTOLIN HFA) 108 (90 Base) MCG/ACT inhaler Inhale 2 puffs into the lungs every 4 (four) hours as needed for wheezing or shortness of breath. 10/22/15   Horton, Barbette Hair, MD  cyclobenzaprine (FLEXERIL) 10 MG tablet Take 1 tablet (10 mg total) by mouth at bedtime as needed for muscle spasms. Jerome Irwin not taking: Reported on 09/21/2016 07/16/16   Robinson, Martin[place] N, PA-C  dextromethorphan-guaiFENesin [facility] DM) 30-600 MG 12hr tablet Take 1 tablet by  mouth 2 (two) times daily. Jerome Irwin not taking: Reported on 09/21/2016 03/22/16   Shary Decamp, PA-C  hydrocortisone [place]) 100 MG/60ML enema Place 1 enema (100 mg total) rectally at bedtime. 09/21/16 09/28/16  Pisciotta, Elmyra Ricks, PA-C  Multiple Vitamins-Minerals (MULTIVITAMIN WITH MINERALS) tablet Take 1 tablet by mouth daily.    [provider]  predniSONE (DELTASONE) 50 MG tablet Take 1 tablet daily with breakfast 09/21/16   Pisciotta, Elmyra Ricks, PA-C  sulfaSALAzine (AZULFIDINE) 500 MG tablet Take 1 tablet (500 mg total) by mouth 4 (four) times daily. Jerome Irwin taking differently: Take 1,000 mg by mouth 3 (three) times daily.  06/23/15   Jeannett Senior, PA-C    Family History No family history on file.  Social History Social History  Substance Use Topics  . Smoking status: Former Smoker    Packs/day: 0.50    Types: Cigarettes  . Smokeless tobacco: Never Used  . Alcohol use No     Allergies   Jerome Irwin has no known allergies.   Review of Systems Review of Systems  Constitutional: Negative for fever.  Gastrointestinal: Negative for nausea and vomiting.  All other systems reviewed and are negative.    Physical Exam Updated Vital Signs BP 119/75 (BP Location: Left Arm)   Pulse 89   Temp 98.3 F (36.8 C) (Oral)   Resp 16   SpO2 95%   Physical Exam  Constitutional: He is oriented to person, place, and time.  He appears well-developed and well-nourished. No distress.  HENT:  Head: Normocephalic and atraumatic.  Eyes: Pupils are equal, round, and reactive to light. EOM are normal.  Cardiovascular: Normal rate.   Pulmonary/Chest: Effort normal.  Abdominal: Soft. He exhibits no distension. There is no tenderness. There is no guarding.  Genitourinary:     Neurological: He is alert and oriented to person, place, and time.  Skin: Skin is warm and dry.  Nursing note and vitals reviewed.    ED Treatments / Results  Labs (all labs ordered are listed, but only  abnormal results are displayed) Labs Reviewed - No data to display  EKG  EKG Interpretation None       Radiology No results found.  Procedures Procedures (including critical care time)  Medications Ordered in ED Medications - No data to display  INCISION AND DRAINAGE Performed by: Blanchie Dessert Consent: Verbal consent obtained. Risks and benefits: risks, benefits and alternatives were discussed Type: abscess  Body area: Jerome Irwin Anesthesia: local infiltration  Incision was made with a scalpel.  Local anesthetic: lidocaine 2% with epinephrine  Anesthetic total: 2 ml  Complexity: simple  Drainage: purulent  Drainage amount: 30m  Packing material: none Jerome Irwin tolerance: Jerome Irwin tolerated the procedure well with no immediate complications.    Initial Impression / Assessment and Plan / ED Course  I have reviewed the triage vital signs and the nursing notes.  Pertinent labs & imaging results that were available during my care of the Jerome Irwin were reviewed by me and considered in my medical decision making (see chart for details).     Jerome Irwin with uncomplicated abscess in the Jerome Irwin area.  No other acute findings at this time.  I&D as above.  No packing required.  Jerome Irwin was discharged home.  Final Clinical Impressions(s) / ED Diagnoses   Final diagnoses:  Abscess    New Prescriptions New Prescriptions   No medications on file     PBlanchie Dessert MD 11/23/16 1737

## 2016-11-24 ENCOUNTER — Ambulatory Visit (HOSPITAL_COMMUNITY)
Admission: EM | Admit: 2016-11-24 | Discharge: 2016-11-24 | Disposition: A | Discharge status: Home / Self Care | Payer: Self-pay | Attending: Internal Medicine | Admitting: Internal Medicine

## 2016-11-24 ENCOUNTER — Encounter (HOSPITAL_COMMUNITY): Payer: Self-pay | Admitting: Emergency Medicine

## 2016-11-24 DIAGNOSIS — Z202 Contact with and (suspected) exposure to infections with a predominantly sexual mode of transmission: Secondary | ICD-10-CM | POA: Insufficient documentation

## 2016-11-24 DIAGNOSIS — R3915 Urgency of urination: Secondary | ICD-10-CM | POA: Insufficient documentation

## 2016-11-24 DIAGNOSIS — Z8619 Personal history of other infectious and parasitic diseases: Secondary | ICD-10-CM

## 2016-11-24 DIAGNOSIS — Z711 Person with feared health complaint in whom no diagnosis is made: Secondary | ICD-10-CM

## 2016-11-24 DIAGNOSIS — Z79899 Other long term (current) drug therapy: Secondary | ICD-10-CM | POA: Insufficient documentation

## 2016-11-24 DIAGNOSIS — Z87891 Personal history of nicotine dependence: Secondary | ICD-10-CM | POA: Insufficient documentation

## 2016-11-24 LAB — POCT URINALYSIS DIP (DEVICE)
BILIRUBIN URINE: NEGATIVE
Glucose, UA: 100 mg/dL — AB
HGB URINE DIPSTICK: NEGATIVE
Ketones, ur: NEGATIVE mg/dL
LEUKOCYTES UA: NEGATIVE
Nitrite: NEGATIVE
Protein, ur: NEGATIVE mg/dL
Urobilinogen, UA: 0.2 mg/dL (ref 0.0–1.0)
pH: 5.5 (ref 5.0–8.0)

## 2016-11-24 NOTE — Discharge Instructions (Signed)
°  Your tests should result inf about 2-3 days.  You will be notified if you need any treatment.  If so, refrain from sexual intercourse for 7 days. Be sure to have all partners tested and treated for STDs.  Practice safe sex by always wearing condoms.

## 2016-11-24 NOTE — ED Notes (Signed)
Patient is unable to void at this time 

## 2016-11-24 NOTE — ED Notes (Signed)
Pt still unable to provide urine sample at this time.  PO fluids provided.

## 2016-11-24 NOTE — ED Triage Notes (Signed)
Pt here for STI check   Sx today include urinary urgency  Reports unprotected sex  A&O x4... NAD... Ambulatory

## 2016-11-24 NOTE — ED Provider Notes (Signed)
[place]    CSN: [id number] Arrival date & time: 11/24/16  1604     History   Chief Complaint Chief Complaint  Patient presents with  . Exposure to STD    HPI Joram Venson is a 36 y.o. male.   HPI Drako Maese is a 36 y.o. male presenting to UC with request for an STI check due to having urinary urgency.  He reports having unprotected intercourse with his partner.  He states they were both recently tested and treated for trichomonas but is concerned her symptoms were not fully treated.  He has had mild nausea but states he also has ulcerative colitis. Denies fever, chills, vomiting or diarrhea. Denies abdominal pain, back pain, dysuria or penile discharge.     Past Medical History:  Diagnosis Date  . Bronchitis   . Pneumonia   . Ulcerative colitis ([place])     There are no active problems to display for this patient.   Past Surgical History:  Procedure Laterality Date  . COLONOSCOPY WITH PROPOFOL N/A 02/21/2016   Procedure: COLONOSCOPY WITH PROPOFOL;  Surgeon: Arta Silence, MD;  Location: [facility];  Service: Endoscopy;  Laterality: N/A;       Home Medications    Prior to Admission medications   Medication Sig Start Date End Date Taking? Authorizing Provider  Multiple Vitamins-Minerals (MULTIVITAMIN WITH MINERALS) tablet Take 1 tablet by mouth daily.   Yes [provider]  sulfaSALAzine (AZULFIDINE) 500 MG tablet Take 1 tablet (500 mg total) by mouth 4 (four) times daily. Patient taking differently: Take 1,000 mg by mouth 3 (three) times daily.  06/23/15  Yes Kirichenko, Tatyana, PA-C  albuterol (PROVENTIL HFA;VENTOLIN HFA) 108 (90 Base) MCG/ACT inhaler Inhale 2 puffs into the lungs every 4 (four) hours as needed for wheezing or shortness of breath. 10/22/15   Horton, Barbette Hair, MD  cyclobenzaprine (FLEXERIL) 10 MG tablet Take 1 tablet (10 mg total) by mouth at bedtime as needed for muscle spasms. Patient not taking: Reported on  09/21/2016 07/16/16   Robinson, [place] N, PA-C  dextromethorphan-guaiFENesin [facility] DM) 30-600 MG 12hr tablet Take 1 tablet by mouth 2 (two) times daily. Patient not taking: Reported on 09/21/2016 03/22/16   Shary Decamp, PA-C  hydrocortisone [facility]) 100 MG/60ML enema Place 1 enema (100 mg total) rectally at bedtime. 09/21/16 09/28/16  Pisciotta, Elmyra Ricks, PA-C  predniSONE (DELTASONE) 50 MG tablet Take 1 tablet daily with breakfast 09/21/16   Pisciotta, Charna Elizabeth    Family History History reviewed. No pertinent family history.  Social History Social History  Substance Use Topics  . Smoking status: Former Smoker    Packs/day: 0.50    Types: Cigarettes  . Smokeless tobacco: Never Used  . Alcohol use No     Allergies   Patient has no known allergies.   Review of Systems Review of Systems  Constitutional: Negative for chills and fever.  Gastrointestinal: Positive for nausea. Negative for abdominal pain, constipation, diarrhea and vomiting.  Genitourinary: Positive for urgency. Negative for discharge, dysuria, hematuria, penile pain and testicular pain.  Musculoskeletal: Negative for back pain and myalgias.  Skin: Negative for rash.     Physical Exam Triage Vital Signs ED Triage Vitals [11/24/16 1612]  Enc Vitals Group     BP 104/66     Pulse Rate 81     Resp 18     Temp 98.4 F (36.9 C)     Temp Source Oral     SpO2 97 %  Weight      Height      Head Circumference      Peak Flow      Pain Score      Pain Loc      Pain Edu?      Excl. in [place]?    No data found.   Updated Vital Signs BP 104/66 (BP Location: Left Arm)   Pulse 81   Temp 98.4 F (36.9 C) (Oral)   Resp 18   SpO2 97%   Visual Acuity Right Eye Distance:   Left Eye Distance:   Bilateral Distance:    Right Eye Near:   Left Eye Near:    Bilateral Near:     Physical Exam  Constitutional: He is oriented to person, place, and time. He appears well-developed and well-nourished. No distress.    HENT:  Head: Normocephalic and atraumatic.  Mouth/Throat: Oropharynx is clear and moist.  Eyes: EOM are normal.  Neck: Normal range of motion.  Cardiovascular: Normal rate.   Pulmonary/Chest: Effort normal.  Genitourinary:  Genitourinary Comments: Deferred  Musculoskeletal: Normal range of motion.  Neurological: He is alert and oriented to person, place, and time.  Skin: Skin is warm and dry. No rash noted. He is not diaphoretic.  Psychiatric: He has a normal mood and affect. His behavior is normal.  Nursing note and vitals reviewed.    UC Treatments / Results  Labs (all labs ordered are listed, but only abnormal results are displayed) Labs Reviewed  POCT URINALYSIS DIP (DEVICE) - Abnormal; Notable for the following:       Result Value   Glucose, UA 100 (*)    All other components within normal limits  HIV ANTIBODY (ROUTINE TESTING)  RPR  URINE CYTOLOGY ANCILLARY ONLY    EKG  EKG Interpretation None       Radiology No results found.  Procedures Procedures (including critical care time)  Medications Ordered in UC Medications - No data to display   Initial Impression / Assessment and Plan / UC Course  I have reviewed the triage vital signs and the nursing notes.  Pertinent labs & imaging results that were available during my care of the patient were reviewed by me and considered in my medical decision making (see chart for details).     Pt requesting testing to STIs including HIV and syphilis  Urine and blood sent to lab Will hold off on empiric treatment Pt will be notified if any treatment needed.  Encouraged to refrain from intercourse until results come back, then at least 7 days if treatment needed. Discussed safe sex with condoms.   Final Clinical Impressions(s) / UC Diagnoses   Final diagnoses:  Urinary urgency  Concern about STD in male without diagnosis  History of gonorrhea    New Prescriptions Discharge Medication List as of 11/24/2016   5:28 PM       Controlled Substance Prescriptions [place] Controlled Substance Registry consulted? Not Applicable   Tyrell Antonio 11/24/16 1759

## 2016-11-25 LAB — RPR: RPR Ser Ql: NONREACTIVE

## 2016-11-25 LAB — URINE CYTOLOGY ANCILLARY ONLY
Chlamydia: NEGATIVE
Neisseria Gonorrhea: NEGATIVE
Trichomonas: NEGATIVE

## 2016-11-25 LAB — HIV ANTIBODY (ROUTINE TESTING W REFLEX): HIV Screen 4th Generation wRfx: NONREACTIVE

## 2016-12-01 ENCOUNTER — Encounter (HOSPITAL_COMMUNITY): Payer: Self-pay | Admitting: *Deleted

## 2016-12-01 ENCOUNTER — Other Ambulatory Visit: Payer: Self-pay

## 2016-12-01 ENCOUNTER — Emergency Department (HOSPITAL_COMMUNITY)
Admission: EM | Admit: 2016-12-01 | Discharge: 2016-12-01 | Disposition: A | Discharge status: Home / Self Care | Payer: Self-pay | Attending: Emergency Medicine | Admitting: Emergency Medicine

## 2016-12-01 DIAGNOSIS — K51911 Ulcerative colitis, unspecified with rectal bleeding: Secondary | ICD-10-CM | POA: Insufficient documentation

## 2016-12-01 DIAGNOSIS — Z87891 Personal history of nicotine dependence: Secondary | ICD-10-CM | POA: Insufficient documentation

## 2016-12-01 DIAGNOSIS — Z79899 Other long term (current) drug therapy: Secondary | ICD-10-CM | POA: Insufficient documentation

## 2016-12-01 MED ORDER — HYDROCORTISONE 100 MG/60ML RE ENEM: 1.0000 | ENEMA | Freq: Every day | RECTAL | 0 refills | Status: DC

## 2016-12-01 MED ORDER — SULFASALAZINE 500 MG PO TABS: 500.0000 mg | ORAL_TABLET | Freq: Four times a day (QID) | ORAL | 0 refills | Status: DC

## 2016-12-01 NOTE — Care Management (Signed)
CM spoke with the patient at the bedside. Patient is unable to provide the name of the suppository he was on prior to moving to [place]. He states he was on a suppository in [place] which helped control his colitis symptoms. He states he received Medicaid in [place] and received the medications while incarcerated. He is seeing Stanton Kidney P.,NP at the [facility] for his primary care. He reports she has been working with him to get assistance with the medication long term but states it may take a while. He states the medications he was placed on during a previous ED visit at [facility] did help his symptoms and he was able to afford the medication with the assistance of his family. Encouraged to complete the application process for the patient assistance program with the assistance of Mary at the [facility]. He states he will discuss this with Stanton Kidney, NP when she sees her this month. Venita Sheffield RN CCM

## 2016-12-01 NOTE — ED Provider Notes (Signed)
[facility] DEPT Provider Note   CSN: [id number] Arrival date & time: 12/01/16  0706     History   Chief Complaint Chief Complaint  Patient presents with  . Rectal Bleeding  . SEXUALLY TRANSMITTED DISEASE    HPI Jerome Irwin is a 36 y.o. male.  HPI Patient presents with 2 complaints.  First complaint is worry that he may have herpes.  States that he recently had a bump on his groin.  States he thought it was an ingrown hair and it was drained but now is worried it is herpes because the woman he is sleeping with just told him that she has herpes.  States he has some burning when he urinates.  No other lesions.  States that he was seen and had a workup for the burning and no cause was found.  States that he was told they cannot check for herpes.  He has no lesions on his penis.  No penile discharge. Patient also has a poorly had some blood in the stool.  States he has some mild rectal pain.  States this feels like his ulcerative colitis.  He is not on any medications for right now because he does not see the doctors.  States he would like the steroid suppository but states that has been very expensive.  He has had steroid enemas also in the past.  States he does not have a gastroenterologist that he sees but he was better controlled when he was in prison. Past Medical History:  Diagnosis Date  . Bronchitis   . Pneumonia   . Ulcerative colitis ([place])     There are no active problems to display for this patient.   History reviewed. No pertinent surgical history.     Home Medications    Prior to Admission medications   Medication Sig Start Date End Date Taking? Authorizing Provider  albuterol (PROVENTIL HFA;VENTOLIN HFA) 108 (90 Base) MCG/ACT inhaler Inhale 2 puffs into the lungs every 4 (four) hours as needed for wheezing or shortness of breath. 10/22/15   Horton, Barbette Hair, MD  cyclobenzaprine (FLEXERIL) 10 MG tablet Take 1 tablet (10 mg total)  by mouth at bedtime as needed for muscle spasms. Patient not taking: Reported on 09/21/2016 07/16/16   Robinson, Martin[place] N, PA-C  dextromethorphan-guaiFENesin [facility] DM) 30-600 MG 12hr tablet Take 1 tablet by mouth 2 (two) times daily. Patient not taking: Reported on 09/21/2016 03/22/16   Shary Decamp, PA-C  hydrocortisone (CORTENEMA) 100 MG/60ML enema Place 1 enema (100 mg total) at bedtime for 7 days rectally. 12/01/16 12/08/16  [facility] Belling, MD  Multiple Vitamins-Minerals (MULTIVITAMIN WITH MINERALS) tablet Take 1 tablet by mouth daily.    [provider]  predniSONE (DELTASONE) 50 MG tablet Take 1 tablet daily with breakfast 09/21/16   Pisciotta, Elmyra Ricks, PA-C  sulfaSALAzine (AZULFIDINE) 500 MG tablet Take 1 tablet (500 mg total) 4 (four) times daily by mouth. 12/01/16   [facility] Belling, MD    Family History No family history on file.  Social History Social History   Tobacco Use  . Smoking status: Former Smoker    Packs/day: 0.50    Types: Cigarettes  . Smokeless tobacco: Never Used  Substance Use Topics  . Alcohol use: No  . Drug use: Yes    Types: Marijuana, Cocaine    Comment: 6/16 Marijuana     Allergies   Patient has no known allergies.   Review of Systems Review of Systems  Constitutional: Negative for appetite  change.  HENT: Negative for dental problem.   Respiratory: Negative for shortness of breath.   Gastrointestinal: Positive for anal bleeding. Negative for blood in stool and constipation.  Genitourinary: Positive for dysuria. Negative for frequency and genital sores.  Musculoskeletal: Negative for back pain.  Neurological: Negative for numbness.  Hematological: Negative for adenopathy.  Psychiatric/Behavioral: Negative for confusion.     Physical Exam Updated Vital Signs BP 122/88 (BP Location: Right Arm)   Pulse 66   Temp 98.1 F (36.7 C) (Oral)   Resp 18   Ht 5' 11"  (1.803 m)   Wt 81.6 kg (180 lb)   SpO2 99%   BMI 25.10 kg/m    Physical Exam  Constitutional: He appears well-developed.  HENT:  Head: Atraumatic.  Eyes: EOM are normal.  Neck: Neck supple.  Cardiovascular: Normal rate.  Pulmonary/Chest: Effort normal.  Abdominal: Soft. There is no tenderness.  Genitourinary: Penis normal.  Musculoskeletal: He exhibits no edema.  Neurological: He is alert.  Skin: Skin is warm. Capillary refill takes less than 2 seconds.     ED Treatments / Results  Labs (all labs ordered are listed, but only abnormal results are displayed) Labs Reviewed - No data to display  EKG  EKG Interpretation None       Radiology No results found.  Procedures Procedures (including critical care time)  Medications Ordered in ED Medications - No data to display   Initial Impression / Assessment and Plan / ED Course  I have reviewed the triage vital signs and the nursing notes.  Pertinent labs & imaging results that were available during my care of the patient were reviewed by me and considered in my medical decision making (see chart for details).     Patient with rectal bleeding.  History of same and ulcerative colitis.  Off treatment.  Has been seen by care management about some medication assessment.  Has been seen at the [facility].  Benign exam.  Has had hemorrhoids also in the past however will treat for ulcerative colitis and hopefully can get more follow-up.  Final Clinical Impressions(s) / ED Diagnoses   Final diagnoses:  Ulcerative colitis with rectal bleeding, unspecified location [facility])    New Prescriptions This SmartLink is deprecated. Use AVSMEDLIST instead to display the medication list for a patient.   [facility] Belling, MD 12/01/16 1038

## 2016-12-01 NOTE — ED Triage Notes (Signed)
Pt states he was seen for an ingrown hair in his private area this week, now reports partner has herpes and he is concerned if it was related to herpes. Also has some bleeding with bowels movements, bright red that mostly with wiping.

## 2017-01-25 ENCOUNTER — Emergency Department (HOSPITAL_COMMUNITY): Admission: EM | Admit: 2017-01-25 | Discharge: 2017-01-25 | Discharge status: Against Medical Advice | Payer: Self-pay

## 2017-01-25 NOTE — ED Notes (Signed)
Pt called from the lobby with no response x3

## 2017-01-25 NOTE — ED Notes (Signed)
Pt called from the lobby with no response 

## 2017-01-25 NOTE — ED Triage Notes (Signed)
Called  No response from lobby

## 2017-01-26 ENCOUNTER — Other Ambulatory Visit: Payer: Self-pay

## 2017-01-26 ENCOUNTER — Encounter (HOSPITAL_COMMUNITY): Payer: Self-pay | Admitting: *Deleted

## 2017-01-26 ENCOUNTER — Emergency Department (HOSPITAL_COMMUNITY): Payer: Self-pay

## 2017-01-26 ENCOUNTER — Emergency Department (HOSPITAL_COMMUNITY)
Admission: EM | Admit: 2017-01-26 | Discharge: 2017-01-26 | Disposition: A | Discharge status: Home / Self Care | Payer: Self-pay | Attending: Emergency Medicine | Admitting: Emergency Medicine

## 2017-01-26 DIAGNOSIS — R059 Cough, unspecified: Secondary | ICD-10-CM

## 2017-01-26 DIAGNOSIS — Z79899 Other long term (current) drug therapy: Secondary | ICD-10-CM | POA: Insufficient documentation

## 2017-01-26 DIAGNOSIS — J069 Acute upper respiratory infection, unspecified: Secondary | ICD-10-CM | POA: Insufficient documentation

## 2017-01-26 DIAGNOSIS — R05 Cough: Secondary | ICD-10-CM

## 2017-01-26 DIAGNOSIS — R0602 Shortness of breath: Secondary | ICD-10-CM | POA: Insufficient documentation

## 2017-01-26 DIAGNOSIS — Z87891 Personal history of nicotine dependence: Secondary | ICD-10-CM | POA: Insufficient documentation

## 2017-01-26 MED ORDER — ALBUTEROL SULFATE HFA 108 (90 BASE) MCG/ACT IN AERS
1.0000 | INHALATION_SPRAY | Freq: Once | RESPIRATORY_TRACT | Status: AC
  Administered 2017-01-26: 2 via RESPIRATORY_TRACT
  Filled 2017-01-26: qty 6.7

## 2017-01-26 MED ORDER — DEXAMETHASONE SODIUM PHOSPHATE 10 MG/ML IJ SOLN
10.0000 mg | Freq: Once | INTRAMUSCULAR | Status: AC
  Administered 2017-01-26: 10 mg via INTRAMUSCULAR
  Filled 2017-01-26: qty 1

## 2017-01-26 NOTE — Discharge Instructions (Signed)
It was my pleasure taking care of you today!   Fortunately, we did not see evidence of serious infection and can treat your symptoms. Mucinex for nasal congestion. Use inhaler every 6 hours as needed for cough / shortness of breath.  Rest, drink plenty of fluids to be sure you are staying hydrated.   Please follow up with your primary doctor for discussion of your diagnoses and further evaluation after today's visit if symptoms persist longer than 7 days; Return to the ER for high fevers, difficulty breathing or other concerning symptoms

## 2017-01-26 NOTE — ED Notes (Signed)
Patient oxygen while ambulating remain at 96 % room air.

## 2017-01-26 NOTE — ED Notes (Signed)
Bed: [facility] Expected date:  Expected time:  Means of arrival:  Comments: 

## 2017-01-26 NOTE — ED Triage Notes (Addendum)
Pt states he has Bronchitis and has not been prescribed any steroids, C/o SHOB, has taken Musinex without relief.

## 2017-01-26 NOTE — ED Provider Notes (Signed)
[facility] DEPT Provider Note   CSN: [id number] Arrival date & time: 01/26/17  0272     History   Chief Complaint Chief Complaint  Patient presents with  . Bronchitis    HPI Jerome Irwin is a 36 y.o. male.  The history is provided by the patient and medical records. No language interpreter was used.   Jerome Irwin is a 36 y.o. male  with a PMH of ulcerative colitis who presents to the Emergency Department complaining of persistent cough, congestion and shortness of breath x 3-4 days.  He has taken Mucinex with little relief.  He is a current everyday smoker and states that the breathing gets worse after smoking cigarettes.  Denies fever, chills, chest pain, abdominal pain, back pain, nausea, vomiting, diarrhea, sore throat.  Denies sick contacts.  No recent surgeries or immobilizations.  No history of DVT/PE or clotting factor disorders.  No hormone use.  No leg swelling.   Past Medical History:  Diagnosis Date  . Bronchitis   . Pneumonia   . Ulcerative colitis ([place])     There are no active problems to display for this patient.   Past Surgical History:  Procedure Laterality Date  . COLONOSCOPY WITH PROPOFOL N/A 02/21/2016   Procedure: COLONOSCOPY WITH PROPOFOL;  Surgeon: Arta Silence, MD;  [facility];  Service: Endoscopy;  Laterality: N/A;       Home Medications    Prior to Admission medications   Medication Sig Start Date End Date Taking? Authorizing Provider  albuterol (PROVENTIL HFA;VENTOLIN HFA) 108 (90 Base) MCG/ACT inhaler Inhale 2 puffs into the lungs every 4 (four) hours as needed for wheezing or shortness of breath. 10/22/15  Yes Horton, Barbette Hair, MD  dextromethorphan-guaiFENesin [facility] DM) 30-600 MG 12hr tablet Take 1 tablet by mouth 2 (two) times daily. 03/22/16  Yes Shary Decamp, PA-C  Multiple Vitamins-Minerals (MULTIVITAMIN WITH MINERALS) tablet Take 1 tablet by mouth daily.   Yes [provider]  sulfaSALAzine (AZULFIDINE) 500 MG tablet Take 1 tablet (500 mg total) 4 (four) times daily by mouth. 12/01/16  Yes [facility] Belling, MD  cyclobenzaprine (FLEXERIL) 10 MG tablet Take 1 tablet (10 mg total) by mouth at bedtime as needed for muscle spasms. Patient not taking: Reported on 09/21/2016 07/16/16   Robinson, [place] N, PA-C  hydrocortisone (CORTENEMA) 100 MG/60ML enema Place 1 enema (100 mg total) at bedtime for 7 days rectally. 12/01/16 12/08/16  [facility] Belling, MD  predniSONE (DELTASONE) 50 MG tablet Take 1 tablet daily with breakfast 09/21/16   Pisciotta, Elmyra Ricks, PA-C    Family History No family history on file.  Social History Social History   Tobacco Use  . Smoking status: Former Smoker    Packs/day: 0.50    Types: Cigarettes  . Smokeless tobacco: Never Used  Substance Use Topics  . Alcohol use: No  . Drug use: Yes    Types: Marijuana, Cocaine    Comment: 6/16 Marijuana     Allergies   Patient has no known allergies.   Review of Systems Review of Systems  Constitutional: Negative for chills and fever.  HENT: Positive for congestion. Negative for ear pain, sore throat and trouble swallowing.   Respiratory: Positive for cough and shortness of breath.   Cardiovascular: Negative for chest pain, palpitations and leg swelling.  Gastrointestinal: Negative for abdominal pain, diarrhea, nausea and vomiting.  Musculoskeletal: Negative for myalgias.  Skin: Negative for rash.  Allergic/Immunologic: Negative for immunocompromised state.  Neurological: Negative for dizziness  and headaches.     Physical Exam Updated Vital Signs BP 109/60   Pulse 97   Temp 98.1 F (36.7 C) (Oral)   Resp 12   Ht 5' 11"  (1.803 m)   Wt 81.6 kg (180 lb)   SpO2 95%   BMI 25.10 kg/m   Physical Exam  Constitutional: He is oriented to person, place, and time. He appears well-developed and well-nourished. No distress.  HENT:  Head: Normocephalic and atraumatic.    Mouth/Throat: Oropharynx is clear and moist.  + nasal congestion with mucosal edema.  No focal areas of sinus tenderness.  Neck: Normal range of motion. Neck supple.  Cardiovascular: Normal rate, regular rhythm and normal heart sounds.  Pulmonary/Chest: Effort normal.  Lungs are clear to auscultation bilaterally - no w/r/r  Abdominal: Soft. He exhibits no distension. There is no tenderness.  Musculoskeletal: Normal range of motion.  Neurological: He is alert and oriented to person, place, and time.  Skin: Skin is warm and dry. He is not diaphoretic.  Nursing note and vitals reviewed.    ED Treatments / Results  Labs (all labs ordered are listed, but only abnormal results are displayed) Labs Reviewed - No data to display  EKG  EKG Interpretation  Date/Time:  Sunday January 26 2017 09:37:08 EST Ventricular Rate:  82 PR Interval:    QRS Duration: 72 QT Interval:  347 QTC Calculation: 406 R Axis:   44 Text Interpretation:  Sinus rhythm Confirmed by Virgel Manifold [phone]) on 01/26/2017 11:20:45 AM       Radiology Dg Chest 2 View  Result Date: 01/26/2017 CLINICAL DATA:  Shortness of breath and cough. EXAM: CHEST  2 VIEW COMPARISON:  03/22/2016 FINDINGS: The heart size and mediastinal contours are within normal limits. Both lungs are clear. The visualized skeletal structures are unremarkable. IMPRESSION: No active cardiopulmonary disease. Electronically Signed   By: Kerby Moors M.D.   On: 01/26/2017 10:19    Procedures Procedures (including critical care time)  Medications Ordered in ED Medications  albuterol (PROVENTIL HFA;VENTOLIN HFA) 108 (90 Base) MCG/ACT inhaler 1-2 puff (2 puffs Inhalation Given 01/26/17 1019)  dexamethasone (DECADRON) injection 10 mg (10 mg Intramuscular Given 01/26/17 1139)     Initial Impression / Assessment and Plan / ED Course  I have reviewed the triage vital signs and the nursing notes.  Pertinent labs & imaging results that were  available during my care of the patient were reviewed by me and considered in my medical decision making (see chart for details).    Jerome Irwin is a 36 y.o. male who presents to ED for cough, congestion, shortness of breath.   On exam, patient is afebrile, non-toxic appearing with a clear lung exam. Mild rhinorrhea and clear OP.  CXR negative. EKG NSR.   Provided inhaler in ED and shortness of breath improved. Ambulatory in ED maintaining O2 > 96%.   Sxs today likely due to viral URI. Exacerbated by daily smoking - smoking cessation discussed and encouraged. Symptomatic home care instructions discussed. PCP follow up strongly encouraged if symptoms persist. Reasons to return to ER discussed. All questions answered.   Blood pressure 109/60, pulse 97, temperature 98.1 F (36.7 C), temperature source Oral, resp. rate 12, height 5' 11"  (1.803 m), weight 81.6 kg (180 lb), SpO2 95 %.   Final Clinical Impressions(s) / ED Diagnoses   Final diagnoses:  Cough  Upper respiratory tract infection, unspecified type    ED Discharge Orders    None  Ward, Ozella Almond, PA-C 01/26/17 1152    Virgel Manifold, MD 01/27/17 [phone]

## 2017-02-03 ENCOUNTER — Emergency Department (HOSPITAL_COMMUNITY): Payer: Self-pay

## 2017-02-03 ENCOUNTER — Emergency Department (HOSPITAL_COMMUNITY)
Admission: EM | Admit: 2017-02-03 | Discharge: 2017-02-03 | Disposition: A | Discharge status: Home / Self Care | Payer: Self-pay | Attending: Emergency Medicine | Admitting: Emergency Medicine

## 2017-02-03 ENCOUNTER — Other Ambulatory Visit: Payer: Self-pay

## 2017-02-03 ENCOUNTER — Encounter (HOSPITAL_COMMUNITY): Payer: Self-pay

## 2017-02-03 DIAGNOSIS — R0602 Shortness of breath: Secondary | ICD-10-CM | POA: Insufficient documentation

## 2017-02-03 DIAGNOSIS — Z5321 Procedure and treatment not carried out due to patient leaving prior to being seen by health care provider: Secondary | ICD-10-CM | POA: Insufficient documentation

## 2017-02-03 NOTE — ED Provider Notes (Signed)
Blood pressure 122/87, pulse 67, temperature 98 F (36.7 C), resp. rate 16, height 5' 11"  (1.803 m), weight 81.6 kg (180 lb), SpO2 98 %.  Jerome Irwin is a 37 y.o. male complaining of shortness of breath.  Patient left without being seen after triage.  I did not participate in the care of this patient.  Chest x-ray with no infiltrate.   Monico Blitz, Hershal Coria 02/03/17 1139

## 2017-02-03 NOTE — ED Notes (Signed)
Bed: WTR8 Expected date:  Expected time:  Means of arrival:  Comments: 

## 2017-02-03 NOTE — ED Triage Notes (Signed)
Patient reports that he was seen a few days ago for SOB and was given a "steroid shot." Patient states he was feeling better, but had to use his inhaler in the middle of the night.

## 2017-03-11 ENCOUNTER — Other Ambulatory Visit: Payer: Self-pay

## 2017-03-11 ENCOUNTER — Encounter (HOSPITAL_COMMUNITY): Payer: Self-pay | Admitting: Emergency Medicine

## 2017-03-11 ENCOUNTER — Ambulatory Visit (HOSPITAL_COMMUNITY)
Admission: EM | Admit: 2017-03-11 | Discharge: 2017-03-11 | Disposition: A | Discharge status: Home / Self Care | Payer: Self-pay | Attending: Family Medicine | Admitting: Family Medicine

## 2017-03-11 DIAGNOSIS — H9202 Otalgia, left ear: Secondary | ICD-10-CM

## 2017-03-11 DIAGNOSIS — H60392 Other infective otitis externa, left ear: Secondary | ICD-10-CM

## 2017-03-11 MED ORDER — NEOMYCIN-POLYMYXIN-HC 3.5-10000-1 OT SOLN: 4.0000 [drp] | Freq: Four times a day (QID) | OTIC | 0 refills | Status: AC

## 2017-03-11 NOTE — ED Provider Notes (Signed)
[place]    CSN: [id number] Arrival date & time: 03/11/17  1725     History   Chief Complaint Chief Complaint  Patient presents with  . Otalgia    left    HPI Jerome Irwin is a 37 y.o. male.   37 year old male presents with left ear pain for the past 4 to 5 days. Then started having yellowish discharge yesterday. Tried to clean ear canal with hydrogen peroxide. Also having nasal congestion, sinus pressure, and cough for the past week. Saw his PCP yesterday and was dx with URI and bronchitis. He is taking Afrin and using his Albuterol inhaler with some success. Today developed more left ear pain and more discharge and odor from ear canal. Concerned over infection. Denies any fever, sore throat or GI symptoms. Does smoke cigarettes daily. Has history of ulcerative colitis and currently on Sulfasalazine daily.    The history is provided by the patient.    Past Medical History:  Diagnosis Date  . Bronchitis   . Pneumonia   . Ulcerative colitis ([place])     There are no active problems to display for this patient.   Past Surgical History:  Procedure Laterality Date  . COLONOSCOPY WITH PROPOFOL N/A 02/21/2016   Procedure: COLONOSCOPY WITH PROPOFOL;  Surgeon: Arta Silence, MD;  Location: [facility];  Service: Endoscopy;  Laterality: N/A;       Home Medications    Prior to Admission medications   Medication Sig Start Date End Date Taking? Authorizing Provider  sulfaSALAzine (AZULFIDINE) 500 MG tablet Take 1 tablet (500 mg total) 4 (four) times daily by mouth. 12/01/16  Yes Davonna Belling, MD  albuterol (PROVENTIL HFA;VENTOLIN HFA) 108 (90 Base) MCG/ACT inhaler Inhale 2 puffs into the lungs every 4 (four) hours as needed for wheezing or shortness of breath. 10/22/15   Horton, Barbette Hair, MD  Multiple Vitamins-Minerals (MULTIVITAMIN WITH MINERALS) tablet Take 1 tablet by mouth daily.    [provider]  neomycin-polymyxin-hydrocortisone  (CORTISPORIN) OTIC solution Place 4 drops into the left ear 4 (four) times daily for 10 days. 03/11/17 03/21/17  Katy Apo, NP    Family History History reviewed. No pertinent family history.  Social History Social History   Tobacco Use  . Smoking status: Former Smoker    Packs/day: 0.50    Types: Cigarettes  . Smokeless tobacco: Never Used  Substance Use Topics  . Alcohol use: No  . Drug use: Yes    Types: Marijuana, Cocaine    Comment: occasionally     Allergies   Patient has no known allergies.   Review of Systems Review of Systems  Constitutional: Positive for fatigue. Negative for activity change, appetite change, chills and fever.  HENT: Positive for congestion, ear discharge, ear pain, postnasal drip, rhinorrhea and sinus pressure. Negative for facial swelling, hearing loss, mouth sores, sinus pain, sneezing, sore throat, tinnitus and trouble swallowing.   Eyes: Negative for pain, discharge, redness and itching.  Respiratory: Positive for cough and wheezing. Negative for shortness of breath.   Gastrointestinal: Negative for abdominal pain, diarrhea, nausea and vomiting.  Musculoskeletal: Negative for arthralgias, myalgias, neck pain and neck stiffness.  Skin: Negative for rash and wound.  Neurological: Positive for headaches. Negative for dizziness, tremors, seizures, syncope, weakness, light-headedness and numbness.  Hematological: Negative for adenopathy. Does not bruise/bleed easily.     Physical Exam Triage Vital Signs ED Triage Vitals  Enc Vitals Group     BP 03/11/17 1756 134/88  Pulse Rate 03/11/17 1756 84     Resp --      Temp 03/11/17 1756 98.9 F (37.2 C)     Temp Source 03/11/17 1756 Oral     SpO2 03/11/17 1756 98 %     Weight --      Height --      Head Circumference --      Peak Flow --      Pain Score 03/11/17 1754 10     Pain Loc --      Pain Edu? --      Excl. in [place]? --    No data found.  Updated Vital Signs BP 134/88 (BP  Location: Left Arm)   Pulse 84   Temp 98.9 F (37.2 C) (Oral)   SpO2 98%   Visual Acuity Right Eye Distance:   Left Eye Distance:   Bilateral Distance:    Right Eye Near:   Left Eye Near:    Bilateral Near:     Physical Exam  Constitutional: He is oriented to person, place, and time. He appears well-developed and well-nourished. No distress.  HENT:  Head: Normocephalic and atraumatic.  Right Ear: Hearing, tympanic membrane, external ear and ear canal normal. No drainage, swelling or tenderness. Tympanic membrane is not injected, not perforated and not erythematous.  Left Ear: Hearing and tympanic membrane normal. There is drainage, swelling and tenderness. Tympanic membrane is not injected, not perforated and not erythematous.  Nose: Mucosal edema and rhinorrhea present. Right sinus exhibits no maxillary sinus tenderness and no frontal sinus tenderness. Left sinus exhibits no maxillary sinus tenderness and no frontal sinus tenderness.  Mouth/Throat: Uvula is midline, oropharynx is clear and moist and mucous membranes are normal.  Yellow crusted drainage present at opening to left ear canal  Eyes: Conjunctivae and EOM are normal.  Neck: Normal range of motion. Neck supple.  Cardiovascular: Normal rate.  Pulmonary/Chest: Effort normal.  Musculoskeletal: Normal range of motion.  Lymphadenopathy:    He has no cervical adenopathy.  Neurological: He is alert and oriented to person, place, and time.  Skin: Skin is warm and dry. No rash noted.  Psychiatric: He has a normal mood and affect. His behavior is normal. Judgment and thought content normal.     UC Treatments / Results  Labs (all labs ordered are listed, but only abnormal results are displayed) Labs Reviewed - No data to display  EKG  EKG Interpretation None       Radiology No results found.  Procedures Procedures (including critical care time)  Medications Ordered in UC Medications - No data to  display   Initial Impression / Assessment and Plan / UC Course  I have reviewed the triage vital signs and the nursing notes.  Pertinent labs & imaging results that were available during my care of the patient were reviewed by me and considered in my medical decision making (see chart for details).    Reviewed with patient that he has an infection of his ear canal but his ear drum (inner ear) looks normal. Recommend use Cortisporin ear drops- place 4 drops in the left ear 4 times a day for 10 days. Avoid getting water in the ear. Continue using a decongestant or Afrin nasal spray as directed by PCP for sinus congestion. Do not use Afrin for more than 3 days. Avoid smoking. Recommend follow-up with his PCP in 3 days if not improving.    Final Clinical Impressions(s) / UC Diagnoses   Final diagnoses:  Other infective acute otitis externa of left ear  Otalgia of left ear    ED Discharge Orders        Ordered    neomycin-polymyxin-hydrocortisone (CORTISPORIN) OTIC solution  4 times daily     03/11/17 1840       Controlled Substance Prescriptions [place] Controlled Substance Registry consulted? Not Applicable   Katy Apo, NP 03/12/17 [phone]

## 2017-03-11 NOTE — ED Triage Notes (Signed)
Pt reports pain, drainage and odor from his left ear.  Pt states he tried to clean it with peroxide yesterday.

## 2017-03-11 NOTE — Discharge Instructions (Addendum)
Recommend start Cortisporin ear drops- place 4 drops in the left ear 4 times a day for 10 days. Avoid getting water in the ear. Continue using decongestant or Afrin nasal spray as directed by PCP for sinus congestion. Avoid smoking. Recommend follow-up with your PCP in 3 days if not improving.

## 2017-05-05 ENCOUNTER — Encounter (HOSPITAL_COMMUNITY): Payer: Self-pay | Admitting: Emergency Medicine

## 2017-05-05 ENCOUNTER — Ambulatory Visit (HOSPITAL_COMMUNITY)
Admission: EM | Admit: 2017-05-05 | Discharge: 2017-05-05 | Disposition: A | Discharge status: Home / Self Care | Payer: Self-pay | Attending: Family Medicine | Admitting: Family Medicine

## 2017-05-05 DIAGNOSIS — J40 Bronchitis, not specified as acute or chronic: Secondary | ICD-10-CM

## 2017-05-05 MED ORDER — PREDNISONE 20 MG PO TABS: 40.0000 mg | ORAL_TABLET | Freq: Every day | ORAL | 0 refills | Status: AC

## 2017-05-05 MED ORDER — CETIRIZINE HCL 10 MG PO TABS: 10.0000 mg | ORAL_TABLET | Freq: Every day | ORAL | 0 refills | Status: DC

## 2017-05-05 NOTE — ED Triage Notes (Signed)
Pt here for cough and pain wth cough; pt sts hx of same with increased pollen

## 2017-05-05 NOTE — ED Provider Notes (Signed)
[place]    CSN: [id number] Arrival date & time: 05/05/17  1104     History   Chief Complaint Chief Complaint  Patient presents with  . Cough    HPI Jerome Irwin is a 37 y.o. male.   [place] presents with complaints of difficulty with deep breathing which has been ongoing for the past 2-3 days. Without cough. Some congestion. States has similar typically with allergies/change of season. History of bronchitis, used albuterol yesterday which briefly helped. No fevers, sore throat, ear pain, known ill contacts. History of ulcerative colitis. Smokes marijuana daily. No recent travel, leg pain or swelling. Denies chest pain.    ROS per HPI.      Past Medical History:  Diagnosis Date  . Bronchitis   . Pneumonia   . Ulcerative colitis ([place])     There are no active problems to display for this patient.   Past Surgical History:  Procedure Laterality Date  . COLONOSCOPY WITH PROPOFOL N/A 02/21/2016   Procedure: COLONOSCOPY WITH PROPOFOL;  Surgeon: Arta Silence, MD;  [facility];  Service: Endoscopy;  Laterality: N/A;       Home Medications    Prior to Admission medications   Medication Sig Start Date End Date Taking? Authorizing Provider  albuterol (PROVENTIL HFA;VENTOLIN HFA) 108 (90 Base) MCG/ACT inhaler Inhale 2 puffs into the lungs every 4 (four) hours as needed for wheezing or shortness of breath. 10/22/15   Horton, Barbette Hair, MD  cetirizine (ZYRTEC) 10 MG tablet Take 1 tablet (10 mg total) by mouth daily. 05/05/17   Zigmund Gottron, NP  Multiple Vitamins-Minerals (MULTIVITAMIN WITH MINERALS) tablet Take 1 tablet by mouth daily.    [provider]  predniSONE (DELTASONE) 20 MG tablet Take 2 tablets (40 mg total) by mouth daily with breakfast for 5 days. 05/05/17 05/10/17  Zigmund Gottron, NP  sulfaSALAzine (AZULFIDINE) 500 MG tablet Take 1 tablet (500 mg total) 4 (four) times daily by mouth. 12/01/16   Davonna Belling, MD     Family History History reviewed. No pertinent family history.  Social History Social History   Tobacco Use  . Smoking status: Former Smoker    Packs/day: 0.50    Types: Cigarettes  . Smokeless tobacco: Never Used  Substance Use Topics  . Alcohol use: No  . Drug use: Yes    Types: Marijuana, Cocaine    Comment: occasionally     Allergies   Patient has no known allergies.   Review of Systems Review of Systems   Physical Exam Triage Vital Signs ED Triage Vitals [05/05/17 1117]  Enc Vitals Group     BP 129/78     Pulse Rate 82     Resp 18     Temp (!) 97.3 F (36.3 C)     Temp Source Oral     SpO2 97 %     Weight      Height      Head Circumference      Peak Flow      Pain Score      Pain Loc      Pain Edu?      Excl. in [place]?    No data found.  Updated Vital Signs BP 129/78 (BP Location: Left Arm)   Pulse 82   Temp (!) 97.3 F (36.3 C) (Oral)   Resp 18   SpO2 97%   Visual Acuity Right Eye Distance:   Left Eye Distance:   Bilateral Distance:  Right Eye Near:   Left Eye Near:    Bilateral Near:     Physical Exam  Constitutional: He is oriented to person, place, and time. He appears well-developed and well-nourished.  HENT:  Head: Normocephalic and atraumatic.  Right Ear: Tympanic membrane, external ear and ear canal normal.  Left Ear: Tympanic membrane, external ear and ear canal normal.  Nose: Nose normal. Right sinus exhibits no maxillary sinus tenderness and no frontal sinus tenderness. Left sinus exhibits no maxillary sinus tenderness and no frontal sinus tenderness.  Mouth/Throat: Uvula is midline, oropharynx is clear and moist and mucous membranes are normal.  Eyes: Pupils are equal, round, and reactive to light. Conjunctivae are normal.  Neck: Normal range of motion.  Cardiovascular: Normal rate and regular rhythm.  Pulmonary/Chest: Effort normal and breath sounds normal.  Lungs clear at this time, without cough during exam    Lymphadenopathy:    He has no cervical adenopathy.  Neurological: He is alert and oriented to person, place, and time.  Skin: Skin is warm and dry.  Vitals reviewed.    UC Treatments / Results  Labs (all labs ordered are listed, but only abnormal results are displayed) Labs Reviewed - No data to display  EKG None Radiology No results found.  Procedures Procedures (including critical care time)  Medications Ordered in UC Medications - No data to display   Initial Impression / Assessment and Plan / UC Course  I have reviewed the triage vital signs and the nursing notes.  Pertinent labs & imaging results that were available during my care of the patient were reviewed by me and considered in my medical decision making (see chart for details).     Non toxic in appearance. Afebrile. Without chest pain , tachypnea, tachycardia, hypoxia. Discussed allergic vs viral in nature. 5 days of prednisone and daily zyrtec recommended. Use of albuterol recommended. Encouraged decrease smoking as this likely contributes. Patient verbalized understanding and agreeable to plan.  Ambulatory out of clinic without difficulty.    Final Clinical Impressions(s) / UC Diagnoses   Final diagnoses:  Bronchitis    ED Discharge Orders        Ordered    predniSONE (DELTASONE) 20 MG tablet  Daily with breakfast     05/05/17 1201    cetirizine (ZYRTEC) 10 MG tablet  Daily     05/05/17 1201       Controlled Substance Prescriptions [place] Controlled Substance Registry consulted? Not Applicable   Zigmund Gottron, NP 05/05/17 1205

## 2017-05-05 NOTE — Discharge Instructions (Signed)
Push fluids to ensure adequate hydration and keep secretions thin.  Tylenol and/or ibuprofen as needed for pain or fevers.  5 days of prednisone. Daily zyrtec. If symptoms worsen or do not improve in the next week to return to be seen or to follow up with your PCP.

## 2017-05-23 ENCOUNTER — Ambulatory Visit (INDEPENDENT_AMBULATORY_CARE_PROVIDER_SITE_OTHER): Payer: Self-pay

## 2017-05-23 ENCOUNTER — Ambulatory Visit (HOSPITAL_COMMUNITY)
Admission: EM | Admit: 2017-05-23 | Discharge: 2017-05-23 | Disposition: A | Discharge status: Home / Self Care | Payer: Self-pay | Attending: Family Medicine | Admitting: Family Medicine

## 2017-05-23 ENCOUNTER — Encounter (HOSPITAL_COMMUNITY): Payer: Self-pay | Admitting: *Deleted

## 2017-05-23 DIAGNOSIS — R0602 Shortness of breath: Secondary | ICD-10-CM

## 2017-05-23 DIAGNOSIS — J9811 Atelectasis: Secondary | ICD-10-CM

## 2017-05-23 DIAGNOSIS — F1290 Cannabis use, unspecified, uncomplicated: Secondary | ICD-10-CM

## 2017-05-23 MED ORDER — PREDNISONE 10 MG PO TABS: 20.0000 mg | ORAL_TABLET | Freq: Two times a day (BID) | ORAL | 0 refills | Status: AC

## 2017-05-23 MED ORDER — AZITHROMYCIN 250 MG PO TABS: 250.0000 mg | ORAL_TABLET | Freq: Every day | ORAL | 0 refills | Status: DC

## 2017-05-23 MED ORDER — PREDNISONE 10 MG PO TABS: 20.0000 mg | ORAL_TABLET | Freq: Two times a day (BID) | ORAL | 0 refills | Status: DC

## 2017-05-23 NOTE — ED Triage Notes (Signed)
Patient reports 2-3 days ago started to have shortness of breath and difficulty to catch breath. Patient is quitting smoking. Patient states used inhaler and he does not feel like it has helped.

## 2017-05-23 NOTE — Discharge Instructions (Addendum)
Drink fluids and get rest Prescribed prednisone 40 mg for 5 days Prescribed azithromycin. Take as prescribed and to completion Recommend deep breathing, coughing, and to quit smoking Follow up with PCP for reevaluation in 1-2 weeks to ensure your symptoms are improving Return or go to ER if you have any new or worsening symptoms

## 2017-05-23 NOTE — ED Provider Notes (Signed)
[place]    CSN: [id number] Arrival date & time: 05/23/17  1247     History   Chief Complaint Chief Complaint  Patient presents with  . Bronchitis    HPI Jerome Irwin is a 37 y.o. male.   complains of SOB for the past three days.  He denies a precipitating event, but smokes marijuana 2-3 days/week.  He describes his symptoms.  He has tried using his inhaler without relief.  His symptoms are made worse with smoking. He was seen on 05/06/17 [facility] and diagnosed with bronchitis.  He was prescribed prednisone and zyrtec at that time.  He reports improvement in his symptoms.       Past Medical History:  Diagnosis Date  . Bronchitis   . Pneumonia   . Ulcerative colitis ([place])     There are no active problems to display for this patient.   Past Surgical History:  Procedure Laterality Date  . COLONOSCOPY WITH PROPOFOL N/A 02/21/2016   Procedure: COLONOSCOPY WITH PROPOFOL;  Surgeon: Arta Silence, MD;  Location: [facility];  Service: Endoscopy;  Laterality: N/A;       Home Medications    Prior to Admission medications   Medication Sig Start Date End Date Taking? Authorizing Provider  albuterol (PROVENTIL HFA;VENTOLIN HFA) 108 (90 Base) MCG/ACT inhaler Inhale 2 puffs into the lungs every 4 (four) hours as needed for wheezing or shortness of breath. 10/22/15  Yes Horton, Barbette Hair, MD  cetirizine (ZYRTEC) 10 MG tablet Take 1 tablet (10 mg total) by mouth daily. 05/05/17  Yes Burky, Lanelle Bal B, NP  azithromycin (ZITHROMAX) 250 MG tablet Take 1 tablet (250 mg total) by mouth daily. Take first 2 tablets together, then 1 every day until finished. 05/23/17   Gunnard Dorrance, [place], PA-C  Multiple Vitamins-Minerals (MULTIVITAMIN WITH MINERALS) tablet Take 1 tablet by mouth daily.    [provider]  predniSONE (DELTASONE) 10 MG tablet Take 2 tablets (20 mg total) by mouth 2 (two) times daily with a meal for 5 days. 05/23/17 05/28/17  Gracie Gupta, Marye Round, PA-C  sulfaSALAzine  (AZULFIDINE) 500 MG tablet Take 1 tablet (500 mg total) 4 (four) times daily by mouth. 12/01/16   Davonna Belling, MD    Family History History reviewed. No pertinent family history.  Social History Social History   Tobacco Use  . Smoking status: Former Smoker    Packs/day: 0.50    Types: Cigarettes  . Smokeless tobacco: Never Used  Substance Use Topics  . Alcohol use: No  . Drug use: Yes    Types: Marijuana, Cocaine    Comment: occasionally     Allergies   Patient has no known allergies.   Review of Systems Review of Systems  Constitutional: Negative for chills, fatigue and fever.  HENT: Positive for congestion. Negative for ear pain, rhinorrhea, sinus pressure, sinus pain, sneezing and sore throat.   Respiratory: Positive for shortness of breath. Negative for cough and wheezing.   Cardiovascular: Negative for chest pain.  Gastrointestinal: Negative for abdominal pain.     Physical Exam Triage Vital Signs ED Triage Vitals  Enc Vitals Group     BP 05/23/17 1308 127/83     Pulse Rate 05/23/17 1308 78     Resp 05/23/17 1308 17     Temp 05/23/17 1308 98.1 F (36.7 C)     Temp Source 05/23/17 1308 Oral     SpO2 05/23/17 1308 98 %     Weight --      Height --  Head Circumference --      Peak Flow --      Pain Score 05/23/17 1309 0     Pain Loc --      Pain Edu? --      Excl. in [place]? --    No data found.  Updated Vital Signs BP 127/83 (BP Location: Left Arm)   Pulse 78   Temp 98.1 F (36.7 C) (Oral)   Resp 17   SpO2 98%   Physical Exam  Constitutional: He is oriented to person, place, and time. He appears well-developed and well-nourished. No distress.  HENT:  Head: Normocephalic and atraumatic.  Right Ear: External ear normal.  Left Ear: External ear normal.  Nose: Nose normal.  Mouth/Throat: No oropharyngeal exudate.  Eyes: Pupils are equal, round, and reactive to light. EOM are normal. No scleral icterus.  Neck: Normal range of motion. Neck  supple.  Cardiovascular: Normal rate, regular rhythm and normal heart sounds. Exam reveals no friction rub.  No murmur heard. Radial pulse 2+ bilaterally    Pulmonary/Chest: Effort normal and breath sounds normal. No stridor. No respiratory distress. He has no wheezes. He has no rales.  Patient speaking in full sentences without difficulty CTA bilaterally about the posterior and anterior chest.    Abdominal: Soft. Bowel sounds are normal. There is no tenderness. There is no guarding.  Musculoskeletal:  Ambulates from chair to exam table without difficulty No calf tenderness to palpation  Lymphadenopathy:    He has no cervical adenopathy.  Neurological: He is alert and oriented to person, place, and time.  Skin: Skin is warm and dry. Capillary refill takes less than 2 seconds. He is not diaphoretic.  Psychiatric: He has a normal mood and affect. His behavior is normal. Judgment and thought content normal.     UC Treatments / Results  Labs (all labs ordered are listed, but only abnormal results are displayed) Labs Reviewed - No data to display  EKG None Radiology Dg Chest 2 View  Result Date: 05/23/2017 CLINICAL DATA:  Shortness of breath. EXAM: CHEST - 2 VIEW COMPARISON:  02/03/2017. FINDINGS: Mediastinum and hilar structures are normal. Mild left base subsegmental atelectasis. No pleural effusion or pneumothorax. Degenerative change thoracic spine. No acute bony abnormality. IMPRESSION: Mild left base subsegmental atelectasis. Electronically Signed   By: Marcello Moores  Register   On: 05/23/2017 14:16    Chest x-rays showed mild atelectasis of the left lower lobe  I have reviewed the x-rays myself. I am in agreement with the radiologist interpretation.    Procedures Procedures (including critical care time)  Medications Ordered in UC Medications - No data to display   Initial Impression / Assessment and Plan / UC Course  I have reviewed the triage vital signs and the nursing  notes.  Pertinent labs & imaging results that were available during my care of the patient were reviewed by me and considered in my medical decision making (see chart for details).    Patient complains of SOB.  Patient speaking in full sentences.  Lung exam was CTA bilaterally.  VS stable.  Was seen earlier this month and given a course of steroids.  Chest x-ray was ordered and showed mild left sided atelectasis.  Prescribed prednisone 40 mg for 5 days and azithromycin.  We discussed the benefits of quitting smoking, patient is aware and in agreement with this plan.  Patient also instructed to perform deep breathing exercises at home.  He will follow up with PCP  In  1-2 for reevaluation.  Return and ER precautions given  Final Clinical Impressions(s) / UC Diagnoses   Final diagnoses:  Shortness of breath  Marijuana use  Atelectasis    ED Discharge Orders        Ordered    predniSONE (DELTASONE) 10 MG tablet  2 times daily with meals,   Status:  Discontinued     05/23/17 1402    azithromycin (ZITHROMAX) 250 MG tablet  Daily,   Status:  Discontinued     05/23/17 1421    azithromycin (ZITHROMAX) 250 MG tablet  Daily     05/23/17 1435    predniSONE (DELTASONE) 10 MG tablet  2 times daily with meals     05/23/17 1435       Controlled Substance Prescriptions [place] Controlled Substance Registry consulted? Not Applicable   Lestine Box, [place] 05/23/17 1437

## 2017-06-17 ENCOUNTER — Ambulatory Visit (HOSPITAL_COMMUNITY)
Admission: EM | Admit: 2017-06-17 | Discharge: 2017-06-17 | Disposition: A | Discharge status: Home / Self Care | Payer: Self-pay | Attending: Family Medicine | Admitting: Family Medicine

## 2017-06-17 ENCOUNTER — Encounter (HOSPITAL_COMMUNITY): Payer: Self-pay | Admitting: Family Medicine

## 2017-06-17 DIAGNOSIS — J4541 Moderate persistent asthma with (acute) exacerbation: Secondary | ICD-10-CM

## 2017-06-17 MED ORDER — FLUTICASONE-SALMETEROL 100-50 MCG/DOSE IN AEPB: 1.0000 | INHALATION_SPRAY | Freq: Two times a day (BID) | RESPIRATORY_TRACT | 11 refills | Status: DC

## 2017-06-17 NOTE — ED Triage Notes (Signed)
Pt here for SOB and upper back pain that started today. Using inhaler with some relief.

## 2017-06-17 NOTE — ED Provider Notes (Signed)
[facility]   [id number] 06/17/17 Arrival Time: 0258   SUBJECTIVE:  Jerome Irwin is a 37 y.o. male who presents to the urgent care with complaint of mild shortness of breath which is recurrent over the last year.  He has had over 6 chest x-rays during this time and they have all been normal.  At his last visit, patient was started on prednisone and azithromycin.  He said helped for a while but he is having to use his inhaler more now.  Patient also complains about some mild back pain which is getting better.  Patient is unemployed.  He continues to smoke.     Past Medical History:  Diagnosis Date  . Bronchitis   . Pneumonia   . Ulcerative colitis ([place])    History reviewed. No pertinent family history. Social History   Socioeconomic History  . Marital status: Single    Spouse name: Not on file  . Number of children: Not on file  . Years of education: Not on file  . Highest education level: Not on file  Occupational History  . Not on file  Social Needs  . Financial resource strain: Not on file  . Food insecurity:    Worry: Not on file    Inability: Not on file  . Transportation needs:    Medical: Not on file    Non-medical: Not on file  Tobacco Use  . Smoking status: Former Smoker    Packs/day: 0.50    Types: Cigarettes  . Smokeless tobacco: Never Used  Substance and Sexual Activity  . Alcohol use: No  . Drug use: Yes    Types: Marijuana, Cocaine    Comment: occasionally  . Sexual activity: Yes  Lifestyle  . Physical activity:    Days per week: Not on file    Minutes per session: Not on file  . Stress: Not on file  Relationships  . Social connections:    Talks on phone: Not on file    Gets together: Not on file    Attends religious service: Not on file    Active member of club or organization: Not on file    Attends meetings of clubs or organizations: Not on file    Relationship status: Not on file  . Intimate partner violence:    Fear  of current or ex partner: Not on file    Emotionally abused: Not on file    Physically abused: Not on file    Forced sexual activity: Not on file  Other Topics Concern  . Not on file  Social History Narrative  . Not on file   No outpatient medications have been marked as taking for the 06/17/17 encounter [facility] Encounter).   No Known Allergies    ROS: As per HPI, remainder of ROS negative.   OBJECTIVE:   Vitals:   06/17/17 1820  BP: 131/87  Pulse: 72  Resp: 18  Temp: 98.3 F (36.8 C)  SpO2: 100%     General appearance: alert; no distress Eyes: PERRL; EOMI; conjunctiva normal HENT: normocephalic; atraumatic;  external ears normal without trauma; nasal mucosa swollen with watery discharge; oral mucosa normal Neck: supple Lungs: clear to auscultation bilaterally Heart: regular rate and rhythm Back: no CVA tenderness Extremities: no cyanosis or edema; symmetrical with no gross deformities Skin: warm and dry Neurologic: normal gait; grossly normal Psychological: alert and cooperative; normal mood and affect      Labs:  Results for orders placed or performed during the  hospital encounter of 11/24/16  HIV antibody  Result Value Ref Range   HIV Screen 4th Generation wRfx Non Reactive Non Reactive  RPR  Result Value Ref Range   RPR Ser Ql Non Reactive Non Reactive  POCT urinalysis dip (device)  Result Value Ref Range   Glucose, UA 100 (A) NEGATIVE mg/dL   Bilirubin Urine NEGATIVE NEGATIVE   Ketones, ur NEGATIVE NEGATIVE mg/dL   Specific Gravity, Urine >=1.030 1.005 - 1.030   Hgb urine dipstick NEGATIVE NEGATIVE   pH 5.5 5.0 - 8.0   Protein, ur NEGATIVE NEGATIVE mg/dL   Urobilinogen, UA 0.2 0.0 - 1.0 mg/dL   Nitrite NEGATIVE NEGATIVE   Leukocytes, UA NEGATIVE NEGATIVE  Urine cytology ancillary only  Result Value Ref Range   Chlamydia Negative    Neisseria gonorrhea Negative    Trichomonas Negative     Labs Reviewed - No data to display  No results  found.     ASSESSMENT & PLAN:  1. Moderate persistent asthma with exacerbation     Meds ordered this encounter  Medications  . Fluticasone-Salmeterol (ADVAIR DISKUS) 100-50 MCG/DOSE AEPB    Sig: Inhale 1 puff into the lungs 2 (two) times daily.    Dispense:  60 each    Refill:  11    Reviewed expectations re: course of current medical issues. Questions answered. Outlined signs and symptoms indicating need for more acute intervention. Patient verbalized understanding. After Visit Summary given.    Procedures:      Robyn Haber, MD 06/17/17 Bosie Helper

## 2017-06-21 ENCOUNTER — Encounter (HOSPITAL_COMMUNITY): Payer: Self-pay

## 2017-06-21 ENCOUNTER — Other Ambulatory Visit: Payer: Self-pay

## 2017-06-21 ENCOUNTER — Emergency Department (HOSPITAL_COMMUNITY): Payer: Self-pay

## 2017-06-21 ENCOUNTER — Emergency Department (HOSPITAL_COMMUNITY)
Admission: EM | Admit: 2017-06-21 | Discharge: 2017-06-21 | Disposition: A | Discharge status: Home / Self Care | Payer: Self-pay | Attending: Emergency Medicine | Admitting: Emergency Medicine

## 2017-06-21 DIAGNOSIS — Z87891 Personal history of nicotine dependence: Secondary | ICD-10-CM | POA: Insufficient documentation

## 2017-06-21 DIAGNOSIS — Z79899 Other long term (current) drug therapy: Secondary | ICD-10-CM | POA: Insufficient documentation

## 2017-06-21 DIAGNOSIS — R0981 Nasal congestion: Secondary | ICD-10-CM | POA: Insufficient documentation

## 2017-06-21 MED ORDER — LORATADINE 10 MG PO TABS: 10.0000 mg | ORAL_TABLET | Freq: Every day | ORAL | 0 refills | Status: DC

## 2017-06-21 MED ORDER — ALBUTEROL SULFATE (2.5 MG/3ML) 0.083% IN NEBU
5.0000 mg | INHALATION_SOLUTION | Freq: Once | RESPIRATORY_TRACT | Status: AC
  Administered 2017-06-21: 5 mg via RESPIRATORY_TRACT
  Filled 2017-06-21: qty 6

## 2017-06-21 MED ORDER — FLUTICASONE PROPIONATE 50 MCG/ACT NA SUSP: 2.0000 | Freq: Every day | NASAL | 0 refills | Status: DC

## 2017-06-21 NOTE — Discharge Instructions (Signed)
You were given a perception for Claritin and flonase to treat your allergies.  Please take this daily and make sure to stay hydrated while you are taking it.  Please follow-up with your primary care doctor next week.  Return to the emergency department for any new or worsening symptoms.

## 2017-06-21 NOTE — ED Triage Notes (Signed)
Pt reports continued SOB and URI symptoms. He was seen at Urgent Care on 5/21 and given steroids. He states that he has an inhaler and has used it with some relief. A&Ox4. Ambulatory. No labored breathing.

## 2017-06-21 NOTE — ED Provider Notes (Signed)
[facility] DEPT Provider Note   CSN: [id number] Arrival date & time: 06/21/17  1328     History   Chief Complaint Chief Complaint  Patient presents with  . URI    HPI Jerome Irwin is a 37 y.o. male.  HPI  Patient is a 37 year old male with a history of bronchitis and pneumonia presents the emergency department to be evaluated for nasal congestion and rhinorrhea that have been ongoing for the last 3 days.  Triage note indicates that patient was complaining of shortness of breath and URI symptoms however patient denies shortness of breath on my exam.  He denies a cough, sore throat, ear pain.  He denies fevers, chest pain, abdominal pain, nausea or vomiting.  He states he has a history of seasonal allergies but he has not currently been taking an allergy medication.  He was seen on 06/17/2017 urgent care complaining of shortness of breath has been occurring over the last year.  According note he had been started on prednisone and azithromycin at that time.  He was given a prescription for Advair which he states he has been using with mild improvement of his symptoms.  Past Medical History:  Diagnosis Date  . Bronchitis   . Pneumonia   . Ulcerative colitis ([place])     There are no active problems to display for this patient.   Past Surgical History:  Procedure Laterality Date  . COLONOSCOPY WITH PROPOFOL N/A 02/21/2016   Procedure: COLONOSCOPY WITH PROPOFOL;  Surgeon: Arta Silence, MD;  Location: [facility];  Service: Endoscopy;  Laterality: N/A;        Home Medications    Prior to Admission medications   Medication Sig Start Date End Date Taking? Authorizing Provider  albuterol (PROVENTIL HFA;VENTOLIN HFA) 108 (90 Base) MCG/ACT inhaler Inhale 2 puffs into the lungs every 4 (four) hours as needed for wheezing or shortness of breath. 10/22/15   Horton, Barbette Hair, MD  fluticasone (FLONASE) 50 MCG/ACT nasal spray Place 2 sprays into both  nostrils daily. 06/21/17   Flower Franko S, PA-C  Fluticasone-Salmeterol (ADVAIR DISKUS) 100-50 MCG/DOSE AEPB Inhale 1 puff into the lungs 2 (two) times daily. 06/17/17   Robyn Haber, MD  loratadine (CLARITIN) 10 MG tablet Take 1 tablet (10 mg total) by mouth daily. 06/21/17   Jisel Fleet S, PA-C  Multiple Vitamins-Minerals (MULTIVITAMIN WITH MINERALS) tablet Take 1 tablet by mouth daily.    [provider]  sulfaSALAzine (AZULFIDINE) 500 MG tablet Take 1 tablet (500 mg total) 4 (four) times daily by mouth. 12/01/16   Davonna Belling, MD    Family History History reviewed. No pertinent family history.  Social History Social History   Tobacco Use  . Smoking status: Former Smoker    Packs/day: 0.50    Types: Cigarettes  . Smokeless tobacco: Never Used  Substance Use Topics  . Alcohol use: No  . Drug use: Yes    Types: Marijuana, Cocaine    Comment: occasionally     Allergies   Patient has no known allergies.   Review of Systems Review of Systems  Constitutional: Negative for fever.  HENT: Positive for congestion, postnasal drip and rhinorrhea. Negative for ear discharge, ear pain and sore throat.   Eyes: Negative for visual disturbance.  Respiratory: Negative for cough and shortness of breath.   Cardiovascular: Negative for chest pain and leg swelling.  Gastrointestinal: Negative for abdominal pain, constipation, nausea and vomiting.  Genitourinary: Negative for flank pain.  Musculoskeletal: Negative  for myalgias.  Skin: Negative for wound.  Neurological: Negative for dizziness, numbness and headaches.     Physical Exam Updated Vital Signs BP (!) 130/93 (BP Location: Left Arm)   Pulse 100   Temp 98.4 F (36.9 C) (Oral)   Resp 18   SpO2 96%   Physical Exam  Constitutional: He appears well-developed and well-nourished.  HENT:  Head: Normocephalic and atraumatic.  Bilateral TMs without erythema or effusion.  There is a hole to the nasal septum.   He has mild clear rhinorrhea in the nose with boggy nasal turbinates bilaterally.  No pharyngeal erythema.  No tonsillar edema or exudates.  Eyes: Pupils are equal, round, and reactive to light. Conjunctivae and EOM are normal.  Neck: Normal range of motion. Neck supple.  Cardiovascular: Normal rate, regular rhythm, normal heart sounds and intact distal pulses.  No murmur heard. Pulmonary/Chest: Effort normal and breath sounds normal. No stridor. No respiratory distress. He has no wheezes. He has no rales.  Abdominal: Soft. Bowel sounds are normal. There is no tenderness.  Musculoskeletal: He exhibits no edema.  Lymphadenopathy:    He has no cervical adenopathy.  Neurological: He is alert. No cranial nerve deficit.  Skin: Skin is warm and dry.  Psychiatric: He has a normal mood and affect.  Nursing note and vitals reviewed.   ED Treatments / Results  Labs (all labs ordered are listed, but only abnormal results are displayed) Labs Reviewed - No data to display  EKG None  Radiology Dg Chest 2 View  Result Date: 06/21/2017 CLINICAL DATA:  Short of breath today.  Denies chest pain. EXAM: CHEST - 2 VIEW COMPARISON:  05/23/2017 FINDINGS: Normal heart size. Lungs clear. No pneumothorax. No pleural effusion. IMPRESSION: No active cardiopulmonary disease. Electronically Signed   By: Marybelle Killings M.D.   On: 06/21/2017 14:33    Procedures Procedures (including critical care time)  Medications Ordered in ED Medications  albuterol (PROVENTIL) (2.5 MG/3ML) 0.083% nebulizer solution 5 mg (5 mg Nebulization Given 06/21/17 1358)     Initial Impression / Assessment and Plan / ED Course  I have reviewed the triage vital signs and the nursing notes.  Pertinent labs & imaging results that were available during my care of the patient were reviewed by me and considered in my medical decision making (see chart for details).     Final Clinical Impressions(s) / ED Diagnoses   Final diagnoses:    Nasal congestion   Pt CXR negative for acute infiltrate. Patients symptoms are consistent with URI vs seasonal allergies.  He has inhalers at home to treat recently diagnosed bronchitis.  Discussed that antibiotics are not indicated for viral infections. Pt will be discharged with symptomatic treatment.  Verbalizes understanding and is agreeable with plan. Pt is hemodynamically stable & in NAD prior to dc.   ED Discharge Orders        Ordered    loratadine (CLARITIN) 10 MG tablet  Daily     06/21/17 1615    fluticasone (FLONASE) 50 MCG/ACT nasal spray  Daily     06/21/17 [address] PA-C 06/21/17 1615    Milton Ferguson, MD 06/21/17 2344

## 2017-06-27 ENCOUNTER — Ambulatory Visit (HOSPITAL_COMMUNITY)
Admission: EM | Admit: 2017-06-27 | Discharge: 2017-06-27 | Disposition: A | Discharge status: Home / Self Care | Payer: Self-pay | Attending: Family Medicine | Admitting: Family Medicine

## 2017-06-27 ENCOUNTER — Encounter (HOSPITAL_COMMUNITY): Payer: Self-pay | Admitting: Family Medicine

## 2017-06-27 ENCOUNTER — Emergency Department (HOSPITAL_COMMUNITY)
Admission: EM | Admit: 2017-06-27 | Discharge: 2017-06-27 | Discharge status: Against Medical Advice | Payer: Self-pay | Attending: Emergency Medicine | Admitting: Emergency Medicine

## 2017-06-27 ENCOUNTER — Encounter (HOSPITAL_COMMUNITY): Payer: Self-pay | Admitting: Emergency Medicine

## 2017-06-27 ENCOUNTER — Other Ambulatory Visit: Payer: Self-pay

## 2017-06-27 DIAGNOSIS — J4541 Moderate persistent asthma with (acute) exacerbation: Secondary | ICD-10-CM

## 2017-06-27 DIAGNOSIS — Z5321 Procedure and treatment not carried out due to patient leaving prior to being seen by health care provider: Secondary | ICD-10-CM | POA: Insufficient documentation

## 2017-06-27 DIAGNOSIS — R0602 Shortness of breath: Secondary | ICD-10-CM | POA: Insufficient documentation

## 2017-06-27 MED ORDER — PREDNISONE 10 MG PO TABS: 20.0000 mg | ORAL_TABLET | Freq: Two times a day (BID) | ORAL | 0 refills | Status: DC

## 2017-06-27 NOTE — ED Provider Notes (Signed)
[facility]    CSN: [id number] Arrival date & time: 06/27/17  1027     History   Chief Complaint Chief Complaint  Patient presents with  . Shortness of Breath    HPI Jerome Irwin is a 37 y.o. male.   HPI  Patient comes to the urgent care center frequently.  This is his third time this month.  He states that he has asthma.  He was started on Advair little over week ago.  He does not know what is working it.  His chest feels heavy.  No wheezing.  He states albuterol does not work.  No cough.  No sputum.  No hemoptysis.  No fever or chills.  No allergy symptoms or runny stuffy nose.  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  We discussed that this is absolutely not good for his health, and could be causing respiratory symptoms.  I asked if he needs help stopping.  He refuses.  Past Medical History:  Diagnosis Date  . Bronchitis   . Pneumonia   . Ulcerative colitis ([place])     There are no active problems to display for this patient.   Past Surgical History:  Procedure Laterality Date  . COLONOSCOPY WITH PROPOFOL N/A 02/21/2016   Procedure: COLONOSCOPY WITH PROPOFOL;  Surgeon: Arta Silence, MD;  Location: [facility];  Service: Endoscopy;  Laterality: N/A;       Home Medications    Prior to Admission medications   Medication Sig Start Date End Date Taking? Authorizing Provider  albuterol (PROVENTIL HFA;VENTOLIN HFA) 108 (90 Base) MCG/ACT inhaler Inhale 2 puffs into the lungs every 4 (four) hours as needed for wheezing or shortness of breath. 10/22/15   Horton, Barbette Hair, MD  fluticasone (FLONASE) 50 MCG/ACT nasal spray Place 2 sprays into both nostrils daily. 06/21/17   Couture, Cortni S, PA-C  Fluticasone-Salmeterol (ADVAIR DISKUS) 100-50 MCG/DOSE AEPB Inhale 1 puff into the lungs 2 (two) times daily. 06/17/17   Robyn Haber, MD  Multiple Vitamins-Minerals (MULTIVITAMIN WITH MINERALS) tablet Take 1 tablet by mouth daily.     [provider]  predniSONE (DELTASONE) 10 MG tablet Take 2 tablets (20 mg total) by mouth 2 (two) times daily with a meal. 06/27/17   Raylene Everts, MD  sulfaSALAzine (AZULFIDINE) 500 MG tablet Take 1 tablet (500 mg total) 4 (four) times daily by mouth. 12/01/16   Davonna Belling, MD    Family History History reviewed. No pertinent family history.  Social History Social History   Tobacco Use  . Smoking status: Former Smoker    Packs/day: 0.50    Types: Cigarettes  . Smokeless tobacco: Never Used  Substance Use Topics  . Alcohol use: No    Comment: former  . Drug use: Yes    Types: Cocaine    Comment: last used tonight      Allergies   Patient has no known allergies.   Review of Systems Review of Systems  Constitutional: Negative for chills and fever.  HENT: Negative for ear pain and sore throat.   Eyes: Negative for pain and visual disturbance.  Respiratory: Positive for chest tightness. Negative for cough and shortness of breath.   Cardiovascular: Negative for chest pain and palpitations.  Gastrointestinal: Negative for abdominal pain and vomiting.  Genitourinary: Negative for dysuria and hematuria.  Musculoskeletal: Negative for arthralgias and back pain.  Skin: Negative for color change and rash.  Neurological: Negative for seizures and syncope.  All other systems reviewed and are negative.    Physical Exam Triage Vital Signs ED Triage Vitals  Enc Vitals Group     BP 06/27/17 1053 123/86     Pulse Rate 06/27/17 1053 83     Resp 06/27/17 1053 18     Temp 06/27/17 1053 98 F (36.7 C)     Temp src --      SpO2 06/27/17 1053 96 %     Weight --      Height --      Head Circumference --      Peak Flow --      Pain Score 06/27/17 1049 0     Pain Loc --      Pain Edu? --      Excl. in [place]? --    No data found.  Updated Vital Signs BP 123/86   Pulse 83   Temp 98 F (36.7 C)   Resp 18   SpO2 96%   Visual Acuity Right Eye Distance:    Left Eye Distance:   Bilateral Distance:    Right Eye Near:   Left Eye Near:    Bilateral Near:     Physical Exam  Constitutional: He appears well-developed and well-nourished. No distress.  HENT:  Head: Normocephalic and atraumatic.  Mouth/Throat: Oropharynx is clear and moist.  Eyes: Pupils are equal, round, and reactive to light. Conjunctivae are normal.  Neck: Normal range of motion.  Cardiovascular: Normal rate, regular rhythm and normal heart sounds.  Pulmonary/Chest: Effort normal and breath sounds normal. No accessory muscle usage. No tachypnea. No respiratory distress.  Lungs are clear  Abdominal: Soft. He exhibits no distension.  Musculoskeletal: Normal range of motion. He exhibits no edema.  Neurological: He is alert.  Skin: Skin is warm and dry.  No clubbing, cyanosis, or edema      UC Treatments / Results  Labs (all labs ordered are listed, but only abnormal results are displayed) Labs Reviewed - No data to display  EKG None  Radiology No results found.  Procedures Procedures (including critical care time)  Medications Ordered in UC Medications - No data to display  Initial Impression / Assessment and Plan / UC Course  I have reviewed the triage vital signs and the nursing notes.  Pertinent labs & imaging results that were available during my care of the patient were reviewed by me and considered in my medical decision making (see chart for details).     Discussed with patient that his chest tightness does not appear to be asthma.  He has no wheezing.  I am going to put him back on Singulair which he took before.  Told him to keep using his Advair.  He is to use the albuterol only if he has shortness of breath, wheezing, or intractable cough.  He needs to go back to his primary care doctor for follow-up.  He states that he also feels better on the prednisone.  I will give him a 5-day supply.  We discussed that chronic prednisone is not healthy. Final  Clinical Impressions(s) / UC Diagnoses   Final diagnoses:  Moderate persistent asthma with exacerbation     Discharge Instructions     Use the Advair twice a day every day Take the Singulair (montekulast) every day Use albuterol as needed for shortness of breath/wheezing Drink plenty of fluids Take 5 days of prednisone Follow-up with your primary care doctor   ED Prescriptions    Medication Sig Dispense  Auth. Provider   predniSONE (DELTASONE) 10 MG tablet Take 2 tablets (20 mg total) by mouth 2 (two) times daily with a meal. 10 tablet Raylene Everts, MD     Controlled Substance Prescriptions [place] Controlled Substance Registry consulted? Not Applicable   Raylene Everts, MD 06/27/17 [phone]

## 2017-06-27 NOTE — ED Notes (Signed)
Pt called  No response from lobby

## 2017-06-27 NOTE — Discharge Instructions (Addendum)
Use the Advair twice a day every day Take the Singulair (montekulast) every day Use albuterol as needed for shortness of breath/wheezing Drink plenty of fluids Take 5 days of prednisone Follow-up with your primary care doctor

## 2017-06-27 NOTE — ED Notes (Signed)
Pt called to take to treatment room  No response

## 2017-06-27 NOTE — ED Triage Notes (Signed)
Pt states he is feeling short of breath and has used his inhaler without relief  Pt states he was seen here a few days ago for same

## 2017-06-27 NOTE — ED Triage Notes (Signed)
Pt here for intermittent SOB for a month. He has been seen for this multiple times. He has been using his steroid inhaler and rescue inhaler. He is in no acute distress.

## 2017-07-19 ENCOUNTER — Encounter (HOSPITAL_COMMUNITY): Payer: Self-pay

## 2017-07-19 ENCOUNTER — Ambulatory Visit (HOSPITAL_COMMUNITY)
Admission: EM | Admit: 2017-07-19 | Discharge: 2017-07-19 | Disposition: A | Discharge status: Home / Self Care | Payer: Self-pay | Attending: Internal Medicine | Admitting: Internal Medicine

## 2017-07-19 ENCOUNTER — Other Ambulatory Visit: Payer: Self-pay

## 2017-07-19 DIAGNOSIS — B349 Viral infection, unspecified: Secondary | ICD-10-CM

## 2017-07-19 MED ORDER — CETIRIZINE HCL 10 MG PO TABS: 10.0000 mg | ORAL_TABLET | Freq: Every day | ORAL | 0 refills | Status: DC

## 2017-07-19 MED ORDER — IPRATROPIUM BROMIDE 0.06 % NA SOLN: 2.0000 | Freq: Four times a day (QID) | NASAL | 0 refills | Status: DC

## 2017-07-19 MED ORDER — BENZONATATE 100 MG PO CAPS: 100.0000 mg | ORAL_CAPSULE | Freq: Three times a day (TID) | ORAL | 0 refills | Status: DC

## 2017-07-19 NOTE — Discharge Instructions (Signed)
Tessalon for cough. Start flonase, atrovent nasal spray, zyrtec for nasal congestion/drainage. You can use over the counter nasal saline rinse such as neti pot for nasal congestion. Keep hydrated, your urine should be clear to pale yellow in color. Tylenol/motrin for fever and pain. Monitor for any worsening of symptoms, chest pain, shortness of breath, wheezing, swelling of the throat, follow up for reevaluation.   For sore throat/cough try using a honey-based tea. Use 3 teaspoons of honey with juice squeezed from half lemon. Place shaved pieces of ginger into 1/2-1 cup of water and warm over stove top. Then mix the ingredients and repeat every 4 hours as needed.

## 2017-07-19 NOTE — ED Triage Notes (Signed)
Patient presents to [facility] for nasal congestion x2 days.

## 2017-07-19 NOTE — ED Provider Notes (Signed)
[place]    CSN: [id number] Arrival date & time: 07/19/17  1727     History   Chief Complaint Chief Complaint  Patient presents with  . Nasal Congestion    HPI Jerome Irwin is a 37 y.o. male.   37 year old male comes in for 2-day history of URI symptoms.  Has a cough, congestion.  Denies sore throat, rhinorrhea.  Denies fever, chills, night sweats.  States not really short of breath, but has been trouble breathing through his nose.  Denies chest pain, palpitations.  States he uses albuterol once to help with symptoms.  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  He wonders if smoking could trigger his symptoms.     Past Medical History:  Diagnosis Date  . Bronchitis   . Pneumonia   . Ulcerative colitis ([place])     There are no active problems to display for this patient.   Past Surgical History:  Procedure Laterality Date  . COLONOSCOPY WITH PROPOFOL N/A 02/21/2016   Procedure: COLONOSCOPY WITH PROPOFOL;  Surgeon: Arta Silence, MD;  Location: [facility];  Service: Endoscopy;  Laterality: N/A;       Home Medications    Prior to Admission medications   Medication Sig Start Date End Date Taking? Authorizing Provider  albuterol (PROVENTIL HFA;VENTOLIN HFA) 108 (90 Base) MCG/ACT inhaler Inhale 2 puffs into the lungs every 4 (four) hours as needed for wheezing or shortness of breath. 10/22/15  Yes Horton, Barbette Hair, MD  fluticasone (FLONASE) 50 MCG/ACT nasal spray Place 2 sprays into both nostrils daily. 06/21/17  Yes Couture, Cortni S, PA-C  Fluticasone-Salmeterol (ADVAIR DISKUS) 100-50 MCG/DOSE AEPB Inhale 1 puff into the lungs 2 (two) times daily. 06/17/17  Yes Robyn Haber, MD  Multiple Vitamins-Minerals (MULTIVITAMIN WITH MINERALS) tablet Take 1 tablet by mouth daily.   Yes [provider]  sulfaSALAzine (AZULFIDINE) 500 MG tablet Take 1 tablet (500 mg total) 4 (four) times daily by mouth.  12/01/16  Yes Davonna Belling, MD  benzonatate (TESSALON) 100 MG capsule Take 1 capsule (100 mg total) by mouth every 8 (eight) hours. 07/19/17   Tasia Catchings, Linnae Rasool V, PA-C  cetirizine (ZYRTEC) 10 MG tablet Take 1 tablet (10 mg total) by mouth daily. 07/19/17   Tasia Catchings, Sharin Altidor V, PA-C  ipratropium (ATROVENT) 0.06 % nasal spray Place 2 sprays into both nostrils 4 (four) times daily. 07/19/17   Tasia Catchings, Pharoah Goggins V, PA-C  predniSONE (DELTASONE) 10 MG tablet Take 2 tablets (20 mg total) by mouth 2 (two) times daily with a meal. 06/27/17   Raylene Everts, MD    Family History History reviewed. No pertinent family history.  Social History Social History   Tobacco Use  . Smoking status: Former Smoker    Packs/day: 0.50    Types: Cigarettes  . Smokeless tobacco: Never Used  Substance Use Topics  . Alcohol use: No    Comment: former  . Drug use: Yes    Types: Cocaine, Marijuana    Comment: last used tonight      Allergies   Patient has no known allergies.   Review of Systems Review of Systems  Reason unable to perform ROS: See HPI as above.     Physical Exam Triage Vital Signs ED Triage Vitals  Enc Vitals Group     BP 07/19/17 1742 134/78     Pulse Rate 07/19/17 1742 97     Resp 07/19/17 1742 18  Temp 07/19/17 1742 98.5 F (36.9 C)     Temp Source 07/19/17 1742 Oral     SpO2 07/19/17 1742 98 %     Weight --      Height --      Head Circumference --      Peak Flow --      Pain Score 07/19/17 1743 0     Pain Loc --      Pain Edu? --      Excl. in [place]? --    No data found.  Updated Vital Signs BP 134/78 (BP Location: Left Arm)   Pulse 97   Temp 98.5 F (36.9 C) (Oral)   Resp 18   SpO2 99%   Physical Exam  Constitutional: He is oriented to person, place, and time. He appears well-developed and well-nourished. No distress.  HENT:  Head: Normocephalic and atraumatic.  Right Ear: Tympanic membrane, external ear and ear canal normal. Tympanic membrane is not erythematous and not bulging.    Left Ear: Tympanic membrane, external ear and ear canal normal. Tympanic membrane is not erythematous and not bulging.  Nose: Nose normal. Right sinus exhibits no maxillary sinus tenderness and no frontal sinus tenderness. Left sinus exhibits no maxillary sinus tenderness and no frontal sinus tenderness.  Mouth/Throat: Uvula is midline, oropharynx is clear and moist and mucous membranes are normal.  Eyes: Pupils are equal, round, and reactive to light. Conjunctivae are normal.  Neck: Normal range of motion. Neck supple.  Cardiovascular: Normal rate, regular rhythm and normal heart sounds. Exam reveals no gallop and no friction rub.  No murmur heard. Pulmonary/Chest: Effort normal and breath sounds normal. He has no decreased breath sounds. He has no wheezes. He has no rhonchi. He has no rales.  Lymphadenopathy:    He has no cervical adenopathy.  Neurological: He is alert and oriented to person, place, and time.  Skin: Skin is warm and dry.  Psychiatric: He has a normal mood and affect. His behavior is normal. Judgment normal.     UC Treatments / Results  Labs (all labs ordered are listed, but only abnormal results are displayed) Labs Reviewed - No data to display  EKG None  Radiology No results found.  Procedures Procedures (including critical care time)  Medications Ordered in UC Medications - No data to display  Initial Impression / Assessment and Plan / UC Course  I have reviewed the triage vital signs and the nursing notes.  Pertinent labs & imaging results that were available during my care of the patient were reviewed by me and considered in my medical decision making (see chart for details).    Patient nontoxic in appearance.  Afebrile, without tachycardia, tachypnea, hypoxia.  Lung exam is clear to auscultation bilaterally without adventitious lung sounds.  Patient is worried about bronchitis, however, discussed current history and exam does not warrant prednisone use  or antibiotic use.  Will try to clear nasal congestion with symptomatic treatment.  Smoking cessation discussed.  Return precautions given.  Patient expresses understanding and agrees to plan.  Final Clinical Impressions(s) / UC Diagnoses   Final diagnoses:  Viral illness    ED Prescriptions    Medication Sig Dispense Auth. Provider   cetirizine (ZYRTEC) 10 MG tablet Take 1 tablet (10 mg total) by mouth daily. 15 tablet Croix Presley V, PA-C   ipratropium (ATROVENT) 0.06 % nasal spray Place 2 sprays into both nostrils 4 (four) times daily. 15 mL Ok Edwards, PA-C  benzonatate (TESSALON) 100 MG capsule Take 1 capsule (100 mg total) by mouth every 8 (eight) hours. 21 capsule Tobin Chad, [place] 07/19/17 1820

## 2017-08-06 ENCOUNTER — Ambulatory Visit (HOSPITAL_COMMUNITY)
Admission: EM | Admit: 2017-08-06 | Discharge: 2017-08-06 | Disposition: A | Discharge status: Home / Self Care | Payer: Self-pay | Attending: Family Medicine | Admitting: Family Medicine

## 2017-08-06 ENCOUNTER — Encounter (HOSPITAL_COMMUNITY): Payer: Self-pay | Admitting: Emergency Medicine

## 2017-08-06 DIAGNOSIS — J4541 Moderate persistent asthma with (acute) exacerbation: Secondary | ICD-10-CM

## 2017-08-06 MED ORDER — PREDNISONE 10 MG PO TABS: 20.0000 mg | ORAL_TABLET | Freq: Two times a day (BID) | ORAL | 0 refills | Status: DC

## 2017-08-06 MED ORDER — ALBUTEROL SULFATE HFA 108 (90 BASE) MCG/ACT IN AERS
2.0000 | INHALATION_SPRAY | Freq: Once | RESPIRATORY_TRACT | Status: AC
  Administered 2017-08-06: 2 via RESPIRATORY_TRACT

## 2017-08-06 MED ORDER — ALBUTEROL SULFATE HFA 108 (90 BASE) MCG/ACT IN AERS
INHALATION_SPRAY | RESPIRATORY_TRACT | Status: AC
  Filled 2017-08-06: qty 6.7

## 2017-08-06 NOTE — ED Triage Notes (Signed)
Pt here for SOB and wheezing x 3 days

## 2017-08-06 NOTE — ED Provider Notes (Signed)
[place]   [id number] 08/06/17 Arrival Time: 1052  SUBJECTIVE:  Jerome Irwin is a 37 y.o. male hx significant for bronchitis who presents with SOB and wheezing x 3 days.  Denies positive sick exposure or precipitating event.  Describes symptoms as constant.  Has tried inhaler and steroid inhaler without relief.  Symptoms are made worse with smoking.  Reports previous symptoms in the past that improved with oral steroids.   Denies fever, chills, fatigue, sinus pain, rhinorrhea, sore throat, chest pain, nausea, changes in bowel or bladder habits.    ROS: As per HPI.  Past Medical History:  Diagnosis Date  . Bronchitis   . Pneumonia   . Ulcerative colitis [facility])    Past Surgical History:  Procedure Laterality Date  . COLONOSCOPY WITH PROPOFOL N/A 02/21/2016   Procedure: COLONOSCOPY WITH PROPOFOL;  Surgeon: Arta Silence, MD;  Location: [facility];  Service: Endoscopy;  Laterality: N/A;   No Known Allergies No current facility-administered medications on file prior to encounter.    Current Outpatient Medications on File Prior to Encounter  Medication Sig Dispense Refill  . albuterol (PROVENTIL HFA;VENTOLIN HFA) 108 (90 Base) MCG/ACT inhaler Inhale 2 puffs into the lungs every 4 (four) hours as needed for wheezing or shortness of breath. 1 Inhaler 0  . benzonatate (TESSALON) 100 MG capsule Take 1 capsule (100 mg total) by mouth every 8 (eight) hours. 21 capsule 0  . cetirizine (ZYRTEC) 10 MG tablet Take 1 tablet (10 mg total) by mouth daily. 15 tablet 0  . fluticasone (FLONASE) 50 MCG/ACT nasal spray Place 2 sprays into both nostrils daily. 16 g 0  . Fluticasone-Salmeterol (ADVAIR DISKUS) 100-50 MCG/DOSE AEPB Inhale 1 puff into the lungs 2 (two) times daily. 60 each 11  . ipratropium (ATROVENT) 0.06 % nasal spray Place 2 sprays into both nostrils 4 (four) times daily. 15 mL 0  . Multiple Vitamins-Minerals (MULTIVITAMIN WITH MINERALS) tablet Take 1 tablet by mouth daily.     Marland Kitchen sulfaSALAzine (AZULFIDINE) 500 MG tablet Take 1 tablet (500 mg total) 4 (four) times daily by mouth. 120 tablet 0    Social History   Socioeconomic History  . Marital status: Single    Spouse name: Not on file  . Number of children: Not on file  . Years of education: Not on file  . Highest education level: Not on file  Occupational History  . Not on file  Social Needs  . Financial resource strain: Not on file  . Food insecurity:    Worry: Not on file    Inability: Not on file  . Transportation needs:    Medical: Not on file    Non-medical: Not on file  Tobacco Use  . Smoking status: Former Smoker    Packs/day: 0.50    Types: Cigarettes  . Smokeless tobacco: Never Used  Substance and Sexual Activity  . Alcohol use: No    Comment: former  . Drug use: Yes    Types: Cocaine, Marijuana    Comment: last used tonight   . Sexual activity: Yes  Lifestyle  . Physical activity:    Days per week: Not on file    Minutes per session: Not on file  . Stress: Not on file  Relationships  . Social connections:    Talks on phone: Not on file    Gets together: Not on file    Attends religious service: Not on file    Active member of club or organization: Not on file  Attends meetings of clubs or organizations: Not on file    Relationship status: Not on file  . Intimate partner violence:    Fear of current or ex partner: Not on file    Emotionally abused: Not on file    Physically abused: Not on file    Forced sexual activity: Not on file  Other Topics Concern  . Not on file  Social History Narrative  . Not on file   History reviewed. No pertinent family history.   OBJECTIVE:  Vitals:   08/06/17 1107  BP: 125/81  Pulse: 81  Resp: 16  Temp: 98.7 F (37.1 C)  TempSrc: Oral  SpO2: 97%     General appearance: AOx3 in no acute distress HEENT: PERRL.  EOM grossly intact.  no obvious rhinorrhea; tonsils nonerythematous, uvula midline Neck: FROM Lungs: clear to  auscultation bilaterally without adventitious breath sounds Heart: regular rate and rhythm.  Radial pulses 2+ symmetrical bilaterally Skin: warm and dry Psychological: alert and cooperative; normal mood and affect  ASSESSMENT & PLAN:  1. Moderate persistent asthma with acute exacerbation     Meds ordered this encounter  Medications  . albuterol (PROVENTIL HFA;VENTOLIN HFA) 108 (90 Base) MCG/ACT inhaler 2 puff  . predniSONE (DELTASONE) 10 MG tablet    Sig: Take 2 tablets (20 mg total) by mouth 2 (two) times daily with a meal.    Dispense:  10 tablet    Refill:  0    Order Specific Question:   Supervising Provider    Answer:   Wynona Luna [[id number]]   Inhaler given in office.  Use as needed for SOB and/ or wheezing Get plenty of rest and push fluids Prednisone prescribed.  Take as directed and to completion Follow up with PCP if symptoms persist Return or go to ER if you have any new or worsening symptoms   Reviewed expectations re: course of current medical issues. Questions answered. Outlined signs and symptoms indicating need for more acute intervention. Patient verbalized understanding. After Visit Summary given.          Lestine Box, PA-C 08/06/17 1129

## 2017-08-06 NOTE — Discharge Instructions (Signed)
Inhaler given in office.  Use as needed for SOB and/ or wheezing Get plenty of rest and push fluids Prednisone prescribed.  Take as directed and to completion Follow up with PCP if symptoms persist Return or go to ER if you have any new or worsening symptoms

## 2017-08-08 ENCOUNTER — Encounter (HOSPITAL_COMMUNITY): Payer: Self-pay | Admitting: Emergency Medicine

## 2017-08-08 ENCOUNTER — Other Ambulatory Visit: Payer: Self-pay

## 2017-08-08 DIAGNOSIS — Z5321 Procedure and treatment not carried out due to patient leaving prior to being seen by health care provider: Secondary | ICD-10-CM | POA: Insufficient documentation

## 2017-08-08 DIAGNOSIS — R0602 Shortness of breath: Secondary | ICD-10-CM | POA: Insufficient documentation

## 2017-08-08 NOTE — ED Notes (Signed)
Called x1

## 2017-08-08 NOTE — ED Triage Notes (Addendum)
Pt arriving with SOB, nasal congestion, and wheezing. Pt admits to daily smoking of cigarettes, and use of cocaine. Documented significant hx of bronchitis. Pt seen for same on 08/06/17 at [facility].

## 2017-08-09 ENCOUNTER — Emergency Department (HOSPITAL_COMMUNITY)
Admission: EM | Admit: 2017-08-09 | Discharge: 2017-08-09 | Disposition: A | Discharge status: Home / Self Care | Payer: Self-pay | Attending: Emergency Medicine | Admitting: Emergency Medicine

## 2017-08-09 NOTE — ED Notes (Signed)
Called x3

## 2017-08-09 NOTE — ED Notes (Signed)
Called x2

## 2017-08-10 ENCOUNTER — Ambulatory Visit (HOSPITAL_COMMUNITY)
Admission: EM | Admit: 2017-08-10 | Discharge: 2017-08-10 | Disposition: A | Discharge status: Home / Self Care | Payer: Self-pay | Attending: Internal Medicine | Admitting: Internal Medicine

## 2017-08-10 ENCOUNTER — Encounter (HOSPITAL_COMMUNITY): Payer: Self-pay | Admitting: *Deleted

## 2017-08-10 ENCOUNTER — Other Ambulatory Visit: Payer: Self-pay

## 2017-08-10 ENCOUNTER — Ambulatory Visit (INDEPENDENT_AMBULATORY_CARE_PROVIDER_SITE_OTHER): Payer: Self-pay

## 2017-08-10 DIAGNOSIS — R0602 Shortness of breath: Secondary | ICD-10-CM

## 2017-08-10 HISTORY — DX: Unspecified asthma, uncomplicated: J45.909

## 2017-08-10 MED ORDER — IPRATROPIUM-ALBUTEROL 0.5-2.5 (3) MG/3ML IN SOLN
RESPIRATORY_TRACT | Status: AC
  Filled 2017-08-10: qty 3

## 2017-08-10 MED ORDER — IPRATROPIUM-ALBUTEROL 0.5-2.5 (3) MG/3ML IN SOLN
3.0000 mL | Freq: Once | RESPIRATORY_TRACT | Status: AC
  Administered 2017-08-10: 3 mL via RESPIRATORY_TRACT

## 2017-08-10 MED ORDER — PREDNISONE 20 MG PO TABS: 40.0000 mg | ORAL_TABLET | Freq: Every day | ORAL | 0 refills | Status: DC

## 2017-08-10 NOTE — ED Provider Notes (Signed)
[place]    CSN: [id number] Arrival date & time: 08/10/17  1316     History   Chief Complaint Chief Complaint  Patient presents with  . Shortness of Breath    HPI Jerome Irwin is a 37 y.o. male.   Jerome Irwin presents with complaints of shortness of breath  For the past week. Hx of asthma. Has maintenance inhalers, has not used today. Used albuterol this morning at 0300 which helped. States has been smoking more marijuana over the past week. No cough. No fever. No chest pain. No dizziness. No congestion. Chest feels tight. Shared with triage that has also been using cocaine. Was seen 7/11 and provided with two days of prednisone, states it did briefly help. Hx of asthma, bronchitis, pneumonia, UC.    ROS per HPI.      Past Medical History:  Diagnosis Date  . Asthma   . Bronchitis   . Pneumonia   . Ulcerative colitis ([place])     There are no active problems to display for this patient.   Past Surgical History:  Procedure Laterality Date  . COLONOSCOPY WITH PROPOFOL N/A 02/21/2016   Procedure: COLONOSCOPY WITH PROPOFOL;  Surgeon: Arta Silence, MD;  Location: [facility];  Service: Endoscopy;  Laterality: N/A;       Home Medications    Prior to Admission medications   Medication Sig Start Date End Date Taking? Authorizing Provider  albuterol (PROVENTIL HFA;VENTOLIN HFA) 108 (90 Base) MCG/ACT inhaler Inhale 2 puffs into the lungs every 4 (four) hours as needed for wheezing or shortness of breath. 10/22/15  Yes Horton, Barbette Hair, MD  cetirizine (ZYRTEC) 10 MG tablet Take 1 tablet (10 mg total) by mouth daily. 07/19/17  Yes Yu, Amy V, PA-C  fluticasone (FLONASE) 50 MCG/ACT nasal spray Place 2 sprays into both nostrils daily. 06/21/17  Yes Couture, Cortni S, PA-C  Fluticasone-Salmeterol (ADVAIR DISKUS) 100-50 MCG/DOSE AEPB Inhale 1 puff into the lungs 2 (two) times daily. 06/17/17  Yes Robyn Haber, MD  sulfaSALAzine (AZULFIDINE) 500 MG tablet Take 1  tablet (500 mg total) 4 (four) times daily by mouth. 12/01/16  Yes Davonna Belling, MD  ipratropium (ATROVENT) 0.06 % nasal spray Place 2 sprays into both nostrils 4 (four) times daily. 07/19/17   Tasia Catchings, Amy V, PA-C  Multiple Vitamins-Minerals (MULTIVITAMIN WITH MINERALS) tablet Take 1 tablet by mouth daily.    [provider]  predniSONE (DELTASONE) 20 MG tablet Take 2 tablets (40 mg total) by mouth daily with breakfast for 5 days. 08/10/17 08/15/17  Zigmund Gottron, NP    Family History History reviewed. No pertinent family history.  Social History Social History   Tobacco Use  . Smoking status: Former Smoker    Packs/day: 0.50    Types: Cigarettes  . Smokeless tobacco: Never Used  Substance Use Topics  . Alcohol use: Not Currently    Comment: former  . Drug use: Yes    Types: Cocaine, Marijuana    Comment: last used cocaine 2 days ago     Allergies   Patient has no known allergies.   Review of Systems Review of Systems   Physical Exam Triage Vital Signs ED Triage Vitals  Enc Vitals Group     BP 08/10/17 1424 130/78     Pulse Rate 08/10/17 1424 84     Resp 08/10/17 1424 18     Temp 08/10/17 1424 98.1 F (36.7 C)     Temp Source 08/10/17 1424 Oral  SpO2 08/10/17 1424 97 %     Weight --      Height --      Head Circumference --      Peak Flow --      Pain Score 08/10/17 1425 0     Pain Loc --      Pain Edu? --      Excl. in [place]? --    No data found.  Updated Vital Signs BP 130/78   Pulse 84   Temp 98.1 F (36.7 C) (Oral)   Resp 18   SpO2 97%   Visual Acuity Right Eye Distance:   Left Eye Distance:   Bilateral Distance:    Right Eye Near:   Left Eye Near:    Bilateral Near:     Physical Exam  Constitutional: He is oriented to person, place, and time. He appears well-developed and well-nourished.  HENT:  Head: Normocephalic and atraumatic.  Right Ear: Tympanic membrane, external ear and ear canal normal.  Left Ear: Tympanic membrane,  external ear and ear canal normal.  Nose: Nose normal. Right sinus exhibits no maxillary sinus tenderness and no frontal sinus tenderness. Left sinus exhibits no maxillary sinus tenderness and no frontal sinus tenderness.  Mouth/Throat: Uvula is midline, oropharynx is clear and moist and mucous membranes are normal.  Eyes: Pupils are equal, round, and reactive to light. Conjunctivae are normal.  Neck: Normal range of motion.  Cardiovascular: Normal rate and regular rhythm.  Pulmonary/Chest: Effort normal and breath sounds normal. He has no wheezes.  Lungs clear on auscultation without increased work of breathing  Lymphadenopathy:    He has no cervical adenopathy.  Neurological: He is alert and oriented to person, place, and time.  Skin: Skin is warm and dry.  Vitals reviewed.  EKG NSR rate 83 without acute changes.   UC Treatments / Results  Labs (all labs ordered are listed, but only abnormal results are displayed) Labs Reviewed - No data to display  EKG None  Radiology Dg Chest 2 View  Result Date: 08/10/2017 CLINICAL DATA:  Patient c/o chest tightness and some sob with deep breathing x 1 week with fatigue. No cough. No hx. Current smoker- EXAM: CHEST - 2 VIEW COMPARISON:  06/21/2017. FINDINGS: The heart size and mediastinal contours are within normal limits. Both lungs are clear. No pleural effusion or pneumothorax. The visualized skeletal structures are unremarkable. IMPRESSION: No active cardiopulmonary disease. Electronically Signed   By: Lajean Manes M.D.   On: 08/10/2017 14:57    Procedures Procedures (including critical care time)  Medications Ordered in UC Medications  ipratropium-albuterol (DUONEB) 0.5-2.5 (3) MG/3ML nebulizer solution 3 mL (3 mLs Nebulization Given 08/10/17 1452)    Initial Impression / Assessment and Plan / UC Course  I have reviewed the triage vital signs and the nursing notes.  Pertinent labs & imaging results that were available during my care  of the patient were reviewed by me and considered in my medical decision making (see chart for details).     Non toxic in appearance. Without distress. 5 days of prednisone provided. Continue with use on inhalers. Encouraged decrease to quit smoking. Return precautions provided. If symptoms worsen or do not improve in the next week to return to be seen or to follow up with PCP.  Patient verbalized understanding and agreeable to plan.    Final Clinical Impressions(s) / UC Diagnoses   Final diagnoses:  Shortness of breath     Discharge Instructions  Continue to use your daily inhalers as prescribed, albuterol as needed. 5 days of prednisone. Please try to decrease to quit smoking as this can exacerbate your symptoms.  If symptoms worsen or do not improve in the next week to return to be seen or to follow up with PCP.   If develop worsening of shortness of breath , fevers or chest pain  please return or go to Er.     ED Prescriptions    Medication Sig Dispense Auth. Provider   predniSONE (DELTASONE) 20 MG tablet Take 2 tablets (40 mg total) by mouth daily with breakfast for 5 days. 10 tablet Zigmund Gottron, NP     Controlled Substance Prescriptions [place] Controlled Substance Registry consulted? Not Applicable   Zigmund Gottron, NP 08/10/17 1503

## 2017-08-10 NOTE — Discharge Instructions (Signed)
Continue to use your daily inhalers as prescribed, albuterol as needed. 5 days of prednisone. Please try to decrease to quit smoking as this can exacerbate your symptoms.  If symptoms worsen or do not improve in the next week to return to be seen or to follow up with PCP.   If develop worsening of shortness of breath , fevers or chest pain  please return or go to Er.

## 2017-08-10 NOTE — ED Triage Notes (Addendum)
C/O SOB x 5 days without pain. Was seen in [facility] 08/06/17 & given steroids.  Has been using inhaler without significant relief.

## 2017-08-10 NOTE — ED Notes (Signed)
Returned from xray

## 2017-08-15 ENCOUNTER — Encounter (HOSPITAL_COMMUNITY): Payer: Self-pay

## 2017-08-15 ENCOUNTER — Other Ambulatory Visit: Payer: Self-pay

## 2017-08-15 ENCOUNTER — Emergency Department (HOSPITAL_COMMUNITY)
Admission: EM | Admit: 2017-08-15 | Discharge: 2017-08-15 | Disposition: A | Discharge status: Home / Self Care | Payer: Self-pay | Attending: Emergency Medicine | Admitting: Emergency Medicine

## 2017-08-15 DIAGNOSIS — J4521 Mild intermittent asthma with (acute) exacerbation: Secondary | ICD-10-CM | POA: Insufficient documentation

## 2017-08-15 DIAGNOSIS — Z79899 Other long term (current) drug therapy: Secondary | ICD-10-CM | POA: Insufficient documentation

## 2017-08-15 DIAGNOSIS — Z87891 Personal history of nicotine dependence: Secondary | ICD-10-CM | POA: Insufficient documentation

## 2017-08-15 MED ORDER — CETIRIZINE HCL 10 MG PO TABS: 10.0000 mg | ORAL_TABLET | Freq: Every day | ORAL | 0 refills | Status: DC

## 2017-08-15 MED ORDER — FLUTICASONE PROPIONATE 50 MCG/ACT NA SUSP: 2.0000 | Freq: Every day | NASAL | 0 refills | Status: DC

## 2017-08-15 MED ORDER — IPRATROPIUM-ALBUTEROL 0.5-2.5 (3) MG/3ML IN SOLN
3.0000 mL | Freq: Once | RESPIRATORY_TRACT | Status: AC
  Administered 2017-08-15: 3 mL via RESPIRATORY_TRACT
  Filled 2017-08-15: qty 3

## 2017-08-15 MED ORDER — LORATADINE 10 MG PO TABS
10.0000 mg | ORAL_TABLET | Freq: Once | ORAL | Status: AC
  Administered 2017-08-15: 10 mg via ORAL
  Filled 2017-08-15: qty 1

## 2017-08-15 MED ORDER — PREDNISONE 20 MG PO TABS: 40.0000 mg | ORAL_TABLET | Freq: Every day | ORAL | 0 refills | Status: AC

## 2017-08-15 MED ORDER — ALBUTEROL SULFATE HFA 108 (90 BASE) MCG/ACT IN AERS: 2.0000 | INHALATION_SPRAY | RESPIRATORY_TRACT | 0 refills | Status: DC | PRN

## 2017-08-15 MED ORDER — DEXAMETHASONE SODIUM PHOSPHATE 10 MG/ML IJ SOLN
10.0000 mg | Freq: Once | INTRAMUSCULAR | Status: AC
  Administered 2017-08-15: 10 mg via INTRAMUSCULAR
  Filled 2017-08-15: qty 1

## 2017-08-15 NOTE — ED Triage Notes (Signed)
Pt presents to ED from home for SOB. Pt reports a week of PO steroids. Pt reports using inhaler at home, but not feeling relief.

## 2017-08-15 NOTE — ED Provider Notes (Addendum)
[facility] DEPT Provider Note   CSN: [id number] Arrival date & time: 08/15/17  0250     History   Chief Complaint Chief Complaint  Patient presents with  . Shortness of Breath    HPI Jerome Irwin is a 37 y.o. male.   Shortness of Breath  This is a chronic problem. The problem occurs continuously.The current episode started more than 1 week ago. The problem has not changed since onset.Associated symptoms include wheezing. Pertinent negatives include no fever, no cough, no sputum production, no hemoptysis, no chest pain, no syncope, no abdominal pain, no leg pain, no leg swelling and no claudication. The problem's precipitants include smoke. He has tried oral steroids for the symptoms. The treatment provided no relief. He has had no prior hospitalizations. He has had prior ED visits. He has had no prior ICU admissions. Associated medical issues include asthma.  Is not taking his inhalers and is still smoking.  No f/c/r.  No CP.  No cough.    Past Medical History:  Diagnosis Date  . Asthma   . Bronchitis   . Pneumonia   . Ulcerative colitis ([place])     There are no active problems to display for this patient.   Past Surgical History:  Procedure Laterality Date  . COLONOSCOPY WITH PROPOFOL N/A 02/21/2016   Procedure: COLONOSCOPY WITH PROPOFOL;  Surgeon: Arta Silence, MD;  Location: [facility] END[facility];  Service: Endoscopy;  Laterality: N/A;        Home Medications    Prior to Admission medications   Medication Sig Start Date End Date Taking? Authorizing Provider  albuterol (PROVENTIL HFA;VENTOLIN HFA) 108 (90 Base) MCG/ACT inhaler Inhale 2 puffs into the lungs every 4 (four) hours as needed for wheezing or shortness of breath. 10/22/15  Yes Horton, Barbette Hair, MD  Fluticasone-Salmeterol (ADVAIR DISKUS) 100-50 MCG/DOSE AEPB Inhale 1 puff into the lungs 2 (two) times daily. 06/17/17  Yes Robyn Haber, MD  sulfaSALAzine (AZULFIDINE) 500 MG  tablet Take 1 tablet (500 mg total) 4 (four) times daily by mouth. Patient taking differently: Take 1,000 mg by mouth 3 (three) times daily.  12/01/16  Yes Davonna Belling, MD  cetirizine (ZYRTEC) 10 MG tablet Take 1 tablet (10 mg total) by mouth daily. Patient not taking: Reported on 08/15/2017 07/19/17   Ok Edwards, PA-C  fluticasone [facility]) 50 MCG/ACT nasal spray Place 2 sprays into both nostrils daily. Patient not taking: Reported on 08/15/2017 06/21/17   Couture, Cortni S, PA-C  ipratropium (ATROVENT) 0.06 % nasal spray Place 2 sprays into both nostrils 4 (four) times daily. Patient not taking: Reported on 08/15/2017 07/19/17   Ok Edwards, PA-C  predniSONE (DELTASONE) 20 MG tablet Take 2 tablets (40 mg total) by mouth daily with breakfast for 5 days. Patient not taking: Reported on 08/15/2017 08/10/17 08/15/17  Zigmund Gottron, NP    Family History History reviewed. No pertinent family history.  Social History Social History   Tobacco Use  . Smoking status: Former Smoker    Packs/day: 0.50    Types: Cigarettes  . Smokeless tobacco: Never Used  Substance Use Topics  . Alcohol use: Not Currently    Comment: former  . Drug use: Yes    Types: Cocaine, Marijuana    Comment: last used cocaine 2 days ago     Allergies   Patient has no known allergies.   Review of Systems Review of Systems  Constitutional: Negative for fever.  Eyes: Negative for photophobia.  Respiratory:  Positive for shortness of breath and wheezing. Negative for cough, hemoptysis, sputum production, choking and chest tightness.   Cardiovascular: Negative for chest pain, palpitations, claudication, leg swelling and syncope.  Gastrointestinal: Negative for abdominal pain.  Genitourinary: Negative for flank pain.  Musculoskeletal: Negative for arthralgias.  Neurological: Negative for weakness.  All other systems reviewed and are negative.    Physical Exam Updated Vital Signs BP (!) 130/94 (BP Location: Left  Arm)   Pulse 80   Temp 97.9 F (36.6 C) (Oral)   Resp 18   Ht 5' 11"  (1.803 m)   Wt 90.7 kg (200 lb)   SpO2 95%   BMI 27.89 kg/m   Physical Exam  Constitutional: He is oriented to person, place, and time. He appears well-developed and well-nourished. No distress.  HENT:  Head: Normocephalic and atraumatic.  Mouth/Throat: No oropharyngeal exudate.  Eyes: Pupils are equal, round, and reactive to light. Conjunctivae are normal.  Neck: Normal range of motion. Neck supple.  Cardiovascular: Normal rate, regular rhythm, normal heart sounds and intact distal pulses.  Pulmonary/Chest: No stridor. No respiratory distress. He has wheezes. He has no rales. He exhibits no tenderness.  Abdominal: Soft. Bowel sounds are normal. There is no tenderness.  Musculoskeletal: Normal range of motion. He exhibits no edema or tenderness.  Neurological: He is alert and oriented to person, place, and time. He displays normal reflexes.  Skin: Skin is warm. Capillary refill takes less than 2 seconds.  Psychiatric: He has a normal mood and affect.     ED Treatments / Results  Labs (all labs ordered are listed, but only abnormal results are displayed) Labs Reviewed - No data to display  EKG None  Radiology No results found.  Procedures Procedures (including critical care time)  Medications Ordered in ED Medications  loratadine (CLARITIN) tablet 10 mg (has no administration in time range)  ipratropium-albuterol (DUONEB) 0.5-2.5 (3) MG/3ML nebulizer solution 3 mL (has no administration in time range)  dexamethasone (DECADRON) injection 10 mg (has no administration in time range)     Clear post neb treatment, feels markedly improved   Final Clinical Impressions(s) / ED Diagnoses   Stop smoking and take your medication as direct.    Return for pain, numbness, changes in vision or speech, fevers >100.4 unrelieved by medication, shortness of breath, intractable vomiting, or diarrhea, abdominal  pain, Inability to tolerate liquids or food, cough, altered mental status or any concerns. No signs of systemic illness or infection. The patient is nontoxic-appearing on exam and vital signs are within normal limits. Will refer to urology for microscopy hematuria as patient is asymptomatic.  I have reviewed the triage vital signs and the nursing notes. Pertinent labs &imaging results that were available during my care of the patient were reviewed by me and considered in my medical decision making (see chart for details).  After history, exam, and medical workup I feel the patient has been appropriately medically screened and is safe for discharge home. Pertinent diagnoses were discussed with the patient. Patient was given return precautions.   Roselani Grajeda, MD 08/15/17 8182    Veatrice Kells, MD 08/15/17 9937

## 2017-08-17 ENCOUNTER — Emergency Department (HOSPITAL_COMMUNITY): Payer: Self-pay

## 2017-08-17 ENCOUNTER — Encounter (HOSPITAL_COMMUNITY): Payer: Self-pay

## 2017-08-17 ENCOUNTER — Ambulatory Visit (HOSPITAL_COMMUNITY)
Admission: EM | Admit: 2017-08-17 | Discharge: 2017-08-17 | Disposition: A | Discharge status: Home / Self Care | Payer: Self-pay | Attending: Family Medicine | Admitting: Family Medicine

## 2017-08-17 ENCOUNTER — Encounter (HOSPITAL_COMMUNITY): Payer: Self-pay | Admitting: Emergency Medicine

## 2017-08-17 ENCOUNTER — Emergency Department (HOSPITAL_COMMUNITY)
Admission: EM | Admit: 2017-08-17 | Discharge: 2017-08-17 | Disposition: A | Discharge status: Home / Self Care | Payer: Self-pay | Attending: Emergency Medicine | Admitting: Emergency Medicine

## 2017-08-17 ENCOUNTER — Other Ambulatory Visit: Payer: Self-pay

## 2017-08-17 DIAGNOSIS — R5383 Other fatigue: Secondary | ICD-10-CM

## 2017-08-17 DIAGNOSIS — Z79899 Other long term (current) drug therapy: Secondary | ICD-10-CM | POA: Insufficient documentation

## 2017-08-17 DIAGNOSIS — R079 Chest pain, unspecified: Secondary | ICD-10-CM | POA: Insufficient documentation

## 2017-08-17 DIAGNOSIS — R0789 Other chest pain: Secondary | ICD-10-CM

## 2017-08-17 DIAGNOSIS — J45909 Unspecified asthma, uncomplicated: Secondary | ICD-10-CM | POA: Insufficient documentation

## 2017-08-17 DIAGNOSIS — F1410 Cocaine abuse, uncomplicated: Secondary | ICD-10-CM | POA: Insufficient documentation

## 2017-08-17 DIAGNOSIS — Z87891 Personal history of nicotine dependence: Secondary | ICD-10-CM | POA: Insufficient documentation

## 2017-08-17 DIAGNOSIS — R0602 Shortness of breath: Secondary | ICD-10-CM

## 2017-08-17 LAB — BASIC METABOLIC PANEL
Anion gap: 13 (ref 5–15)
BUN: 13 mg/dL (ref 6–20)
CO2: 24 mmol/L (ref 22–32)
Calcium: 9.8 mg/dL (ref 8.9–10.3)
Chloride: 103 mmol/L (ref 98–111)
Creatinine, Ser: 1.05 mg/dL (ref 0.61–1.24)
GFR calc Af Amer: >60 mL/min (ref >60)
GFR calc non Af Amer: >60 mL/min (ref >60)
Glucose, Bld: 103 mg/dL — ABNORMAL HIGH (ref 70–99)
Potassium: 4 mmol/L (ref 3.5–5.1)
Sodium: 140 mmol/L (ref 135–145)

## 2017-08-17 LAB — CBC
HCT: 41.5 % (ref 39.0–52.0)
Hemoglobin: 13.4 g/dL (ref 13.0–17.0)
MCH: 28.6 pg (ref 26.0–34.0)
MCHC: 32.3 g/dL (ref 30.0–36.0)
MCV: 88.7 fL (ref 78.0–100.0)
PLATELETS: ADEQUATE 10*3/uL (ref 150–400)
RBC: 4.68 MIL/uL (ref 4.22–5.81)
RDW: 15.6 % — AB (ref 11.5–15.5)
WBC: 10.9 10*3/uL — ABNORMAL HIGH (ref 4.0–10.5)

## 2017-08-17 LAB — HEPATIC FUNCTION PANEL
ALT: 21 U/L (ref 0–44)
AST: 28 U/L (ref 15–41)
Albumin: 3.9 g/dL (ref 3.5–5.0)
Alkaline Phosphatase: 76 U/L (ref 38–126)
BILIRUBIN DIRECT: 0.1 mg/dL (ref 0.0–0.2)
Indirect Bilirubin: 0.4 mg/dL (ref 0.3–0.9)
Total Bilirubin: 0.5 mg/dL (ref 0.3–1.2)
Total Protein: 7.5 g/dL (ref 6.5–8.1)

## 2017-08-17 LAB — I-STAT TROPONIN, ED: Troponin i, poc: 0 ng/mL (ref 0.00–0.08)

## 2017-08-17 LAB — LIPASE, BLOOD: Lipase: 40 U/L (ref 11–51)

## 2017-08-17 LAB — D-DIMER, QUANTITATIVE (NOT AT ARMC)

## 2017-08-17 MED ORDER — ALBUTEROL SULFATE HFA 108 (90 BASE) MCG/ACT IN AERS: 1.0000 | INHALATION_SPRAY | Freq: Four times a day (QID) | RESPIRATORY_TRACT | 0 refills | Status: DC | PRN

## 2017-08-17 MED ORDER — ALBUTEROL SULFATE (2.5 MG/3ML) 0.083% IN NEBU
2.5000 mg | INHALATION_SOLUTION | Freq: Once | RESPIRATORY_TRACT | Status: AC
  Administered 2017-08-17: 2.5 mg via RESPIRATORY_TRACT
  Filled 2017-08-17: qty 3

## 2017-08-17 MED ORDER — FAMOTIDINE 20 MG PO TABS: 20.0000 mg | ORAL_TABLET | Freq: Two times a day (BID) | ORAL | 0 refills | Status: DC

## 2017-08-17 MED ORDER — ACETAMINOPHEN 500 MG PO TABS: 1000.0000 mg | ORAL_TABLET | Freq: Four times a day (QID) | ORAL | 0 refills | Status: DC | PRN

## 2017-08-17 MED ORDER — FAMOTIDINE 20 MG PO TABS
40.0000 mg | ORAL_TABLET | Freq: Once | ORAL | Status: AC
  Administered 2017-08-17: 40 mg via ORAL
  Filled 2017-08-17: qty 2

## 2017-08-17 NOTE — ED Provider Notes (Signed)
[facility] EMERGENCY DEPARTMENT Provider Note   CSN: [id number] Arrival date & time: 08/17/17  1728     History   Chief Complaint Chief Complaint  Patient presents with  . Chest Pain    HPI Jerome Irwin is a 37 y.o. male.  HPI Patient is been getting chest pain for 2 to 3 weeks.  He calls it a "tightness".  Not an actual pain.  This waxes and wanes.  Sometimes he feels like it hard to get a deep breath.  No fever, no cough.  No leg swelling or calf pain.  Patient quit smoking several months ago.  He does use cocaine.  He thinks this contributes to his chest pain.  Last use about 2 days ago.  Family history: No early onset coronary artery disease or sudden death.  Patient's father is here provides that history. Past Medical History:  Diagnosis Date  . Asthma   . Bronchitis   . Pneumonia   . Ulcerative colitis ([place])     There are no active problems to display for this patient.   Past Surgical History:  Procedure Laterality Date  . COLONOSCOPY WITH PROPOFOL N/A 02/21/2016   Procedure: COLONOSCOPY WITH PROPOFOL;  Surgeon: Arta Silence, MD;  Location: [facility];  Service: Endoscopy;  Laterality: N/A;        Home Medications    Prior to Admission medications   Medication Sig Start Date End Date Taking? Authorizing Provider  albuterol (PROVENTIL HFA;VENTOLIN HFA) 108 (90 Base) MCG/ACT inhaler Inhale 2 puffs into the lungs every 4 (four) hours as needed for wheezing or shortness of breath. 08/15/17  Yes Palumbo, April, MD  cetirizine (ZYRTEC) 10 MG tablet Take 1 tablet (10 mg total) by mouth daily. 08/15/17  Yes Palumbo, April, MD  fluticasone [facility]) 50 MCG/ACT nasal spray Place 2 sprays into both nostrils daily. 08/15/17  Yes Palumbo, April, MD  Fluticasone-Salmeterol (ADVAIR DISKUS) 100-50 MCG/DOSE AEPB Inhale 1 puff into the lungs 2 (two) times daily. 06/17/17  Yes Robyn Haber, MD  ipratropium (ATROVENT) 0.06 % nasal spray Place 2 sprays  into both nostrils 4 (four) times daily. Patient taking differently: Place 2 sprays into both nostrils 4 (four) times daily as needed (congestion).  07/19/17  Yes Yu, Amy V, PA-C  Multiple Vitamin (MULTIVITAMIN WITH MINERALS) TABS tablet Take 1 tablet by mouth daily.   Yes [provider]  predniSONE (DELTASONE) 20 MG tablet Take 2 tablets (40 mg total) by mouth daily with breakfast for 5 days. Patient taking differently: Take 40 mg by mouth daily with breakfast. 5 day course started 08/15/17 (prior to that 40 mg daily for 2 days) 08/15/17 08/20/17 Yes Palumbo, April, MD  sulfaSALAzine (AZULFIDINE) 500 MG tablet Take 1 tablet (500 mg total) 4 (four) times daily by mouth. Patient taking differently: Take 1,000 mg by mouth 3 (three) times daily.  12/01/16  Yes Davonna Belling, MD  acetaminophen (TYLENOL) 500 MG tablet Take 2 tablets (1,000 mg total) by mouth every 6 (six) hours as needed. 08/17/17   Charlesetta Shanks, MD  albuterol (PROVENTIL HFA;VENTOLIN HFA) 108 (90 Base) MCG/ACT inhaler Inhale 1-2 puffs into the lungs every 6 (six) hours as needed for wheezing or shortness of breath. 08/17/17   Charlesetta Shanks, MD  famotidine (PEPCID) 20 MG tablet Take 1 tablet (20 mg total) by mouth 2 (two) times daily. 08/17/17   Charlesetta Shanks, MD    Family History Patient's father is here and provides family history: No early onset coronary  artery disease or sudden death.  Social History Social History   Tobacco Use  . Smoking status: Former Smoker    Packs/day: 0.50    Types: Cigarettes  . Smokeless tobacco: Never Used  Substance Use Topics  . Alcohol use: Not Currently    Comment: former  . Drug use: Yes    Types: Cocaine, Marijuana    Comment: last used cocaine 2 days ago     Allergies   Patient has no known allergies.   Review of Systems Review of Systems 10 Systems reviewed and are negative for acute change except as noted in the HPI.  Physical Exam Updated Vital Signs BP (!)  136/100 (BP Location: Right Arm)   Pulse 81   Temp 97.6 F (36.4 C) (Oral)   Resp 14   Ht 5' 11"  (1.803 m)   Wt 90.7 kg (200 lb)   SpO2 97%   BMI 27.89 kg/m   Physical Exam  Constitutional: He is oriented to person, place, and time. He appears well-developed and well-nourished. No distress.  HENT:  Head: Normocephalic and atraumatic.  Mouth/Throat: Oropharynx is clear and moist.  Eyes: EOM are normal.  Neck: Neck supple.  Cardiovascular: Normal rate, regular rhythm, normal heart sounds and intact distal pulses.  Pulmonary/Chest: Effort normal and breath sounds normal. He exhibits no tenderness.  Abdominal: Soft. Bowel sounds are normal. He exhibits no distension. There is no tenderness. There is no guarding.  Musculoskeletal: Normal range of motion. He exhibits no edema or tenderness.  Neurological: He is alert and oriented to person, place, and time. He exhibits normal muscle tone. Coordination normal.  Skin: Skin is warm and dry.  Psychiatric: He has a normal mood and affect.     ED Treatments / Results  Labs (all labs ordered are listed, but only abnormal results are displayed) Labs Reviewed  BASIC METABOLIC PANEL - Abnormal; Notable for the following components:      Result Value   Glucose, Bld 103 (*)    All other components within normal limits  CBC - Abnormal; Notable for the following components:   WBC 10.9 (*)    RDW 15.6 (*)    All other components within normal limits  D-DIMER, QUANTITATIVE (NOT AT [facility])  HEPATIC FUNCTION PANEL  LIPASE, BLOOD  I-STAT TROPONIN, ED    EKG EKG will not import from MUSE Sinus rhythm 85 PR 128 QTc 430 Normal sinus rhythm no ST-T wave changes normal EKG.  No change from previous.  Radiology Dg Chest 2 View  Result Date: 08/17/2017 CLINICAL DATA:  Chest pain EXAM: CHEST - 2 VIEW COMPARISON:  August 10, 2017 FINDINGS: There is slight atelectasis in the left base. Lungs elsewhere are clear. The heart size and pulmonary  vascularity are normal. No adenopathy. No bone lesions. No pneumothorax. IMPRESSION: Slight left base atelectasis. Lungs elsewhere clear. No adenopathy. Electronically Signed   By: Lowella Grip III M.D.   On: 08/17/2017 18:14    Procedures Procedures (including critical care time)  Medications Ordered in ED Medications  albuterol (PROVENTIL) (2.5 MG/3ML) 0.083% nebulizer solution 2.5 mg (2.5 mg Nebulization Given 08/17/17 2046)  famotidine (PEPCID) tablet 40 mg (40 mg Oral Given 08/17/17 2132)     Initial Impression / Assessment and Plan / ED Course  I have reviewed the triage vital signs and the nursing notes.  Pertinent labs & imaging results that were available during my care of the patient were reviewed by me and considered in my medical decision  making (see chart for details).     Final Clinical Impressions(s) / ED Diagnoses   Final diagnoses:  Nonspecific chest pain  Cocaine abuse ([place])  Patient presents with waxing and waning chest tightness.  Is been going on for several weeks.  Diagnostic evaluation within normal limits.  Chest x-ray clear, d-dimer negative, troponin negative.  Patient's description of symptoms does not sound cardiac ischemic.  He does have risk factor of cocaine abuse.  Last use was 2 days ago.  Patient reports he does not tend to use more cocaine.  He has quit smoking within the past couple months.  At this time I feel he is safe for outpatient follow-up.  He is to start Pepcid twice daily and follow reflux diet.  He is to avoid all cocaine.  He is to follow-up with his PCP to discuss outpatient stress testing.  ED Discharge Orders        Ordered    famotidine (PEPCID) 20 MG tablet  2 times daily     08/17/17 2216    albuterol (PROVENTIL HFA;VENTOLIN HFA) 108 (90 Base) MCG/ACT inhaler  Every 6 hours PRN     08/17/17 2216    acetaminophen (TYLENOL) 500 MG tablet  Every 6 hours PRN     08/17/17 2216       Charlesetta Shanks, MD 08/17/17 2227

## 2017-08-17 NOTE — Discharge Instructions (Addendum)
1.  Take Pepcid twice daily.  Follow instructions for reflux.  Discontinue all use of cocaine.  Follow up with your doctor next week to discuss outpatient stress testing. 2.  Return to the emergency department if you develop increasing shortness of breath, chest pain, fever or other concerning symptoms.

## 2017-08-17 NOTE — ED Notes (Signed)
Went to call pt upfront and friend stated he was outside moving car but has been gone for a while. Notified nurse first and told her to call us if he came back soon.

## 2017-08-17 NOTE — ED Notes (Signed)
Discharge instructions discussed with Pt. Pt verbalized understanding. Pt stable and ambulatory.    

## 2017-08-17 NOTE — ED Triage Notes (Signed)
Pt presents with complaints of chest pain, reports cocaine use. Chest pain is tight in the middle of his chest. Also endorses fatigue and shortness of breath. ekg obtained and showed to brittany wurst pa. Pt does not meet stemi criteria but is being sent to the er for further work up.

## 2017-08-17 NOTE — ED Triage Notes (Signed)
Pt reports central chest tightness without radiation that has been going on the last 2-3 weeks. Pt denies sob but reports unable to get a full breath in. Pt reports no pain. Pt states cocaine use 2 days ago.

## 2017-08-24 ENCOUNTER — Encounter (HOSPITAL_COMMUNITY): Payer: Self-pay

## 2017-08-24 ENCOUNTER — Emergency Department (HOSPITAL_COMMUNITY)
Admission: EM | Admit: 2017-08-24 | Discharge: 2017-08-24 | Disposition: A | Discharge status: Home / Self Care | Payer: Self-pay | Attending: Emergency Medicine | Admitting: Emergency Medicine

## 2017-08-24 ENCOUNTER — Other Ambulatory Visit: Payer: Self-pay

## 2017-08-24 ENCOUNTER — Encounter (HOSPITAL_COMMUNITY): Payer: Self-pay | Admitting: Emergency Medicine

## 2017-08-24 DIAGNOSIS — R0981 Nasal congestion: Secondary | ICD-10-CM | POA: Insufficient documentation

## 2017-08-24 DIAGNOSIS — J3489 Other specified disorders of nose and nasal sinuses: Secondary | ICD-10-CM | POA: Insufficient documentation

## 2017-08-24 DIAGNOSIS — Z87891 Personal history of nicotine dependence: Secondary | ICD-10-CM | POA: Insufficient documentation

## 2017-08-24 DIAGNOSIS — Z79899 Other long term (current) drug therapy: Secondary | ICD-10-CM | POA: Insufficient documentation

## 2017-08-24 DIAGNOSIS — Z5321 Procedure and treatment not carried out due to patient leaving prior to being seen by health care provider: Secondary | ICD-10-CM | POA: Insufficient documentation

## 2017-08-24 DIAGNOSIS — J45909 Unspecified asthma, uncomplicated: Secondary | ICD-10-CM | POA: Insufficient documentation

## 2017-08-24 DIAGNOSIS — R0600 Dyspnea, unspecified: Secondary | ICD-10-CM | POA: Insufficient documentation

## 2017-08-24 MED ORDER — FLUTICASONE PROPIONATE 50 MCG/ACT NA SUSP: 1.0000 | Freq: Every day | NASAL | 2 refills | Status: DC

## 2017-08-24 MED ORDER — ALBUTEROL SULFATE (2.5 MG/3ML) 0.083% IN NEBU
5.0000 mg | INHALATION_SOLUTION | Freq: Once | RESPIRATORY_TRACT | Status: AC
  Administered 2017-08-24: 5 mg via RESPIRATORY_TRACT
  Filled 2017-08-24: qty 6

## 2017-08-24 NOTE — ED Notes (Signed)
No answer when called for a room.

## 2017-08-24 NOTE — Discharge Instructions (Addendum)
Please continue to use your over-the-counter allergy medication as directed on the package.  This medication can take a few days before it takes effect. You may use the Flonase as prescribed for nasal congestion. Please be sure to follow-up with your primary care provider for further evaluation regarding your visit today.  You also have a large hole in your nose, please be sure to follow-up with your primary care about this as well. Please return to the emergency department for any new or worsening symptoms.  Contact a doctor if: You have a fever. You have a cough that does not go away (is persistent). You start to make whistling sounds when you breathe (wheeze). Your symptoms do not get better with treatment. You have thick fluid coming from your nose. You start to have nosebleeds. Get help right away if: Your tongue or your lips are swollen. You have trouble breathing. You feel light-headed or you feel like you are going to pass out (faint). You have cold sweats.

## 2017-08-24 NOTE — ED Notes (Signed)
Called patient to update vitals, no answer

## 2017-08-24 NOTE — ED Triage Notes (Signed)
He states he was seen here yesterday and was dx with "bronchitis". He co persistent sx, chiefly "congestion". He is in no distress. Fine exp. Wheezes noted bilat.

## 2017-08-24 NOTE — ED Triage Notes (Addendum)
Patient complaining of nasal congestion causing him to have difficulty breathing. Patient in NAD and denies shortness of breath. Patient states he was diagnosed with bronchitis a year ago. Patient able to speak in full sentences and is resting comfortably upon entering the room.

## 2017-08-24 NOTE — ED Provider Notes (Signed)
[facility] DEPT Provider Note   CSN: [id number] Arrival date & time: 08/24/17  0845     History   Chief Complaint Chief Complaint  Patient presents with  . URI    HPI Jerome Irwin is a 37 y.o. male presenting for nasal congestion that began last night.  Patient states that he has been having clear nasal drainage and congestion.  Patient states that he has the symptoms anytime that the weather changes and he normally treats with over-the-counter allergy medication such as Zyrtec.  Patient states that he took 1 dose of Zyrtec last night without relief.   Of note patient with history of asthma states that he received 1 breathing treatment yesterday for shortness of breath.  He received this treatment here last night at 3 AM, and then he left without being seen by a provider.  Patient states that this single treatment relieved his symptoms completely and he is denying shortness of breath during my examination.  Patient denies history of fever, chest pain, abdominal pain, nausea/vomiting, diarrhea, rash, sore throat, cough.  Patient denying shortness of breath at this time.  HPI  Past Medical History:  Diagnosis Date  . Asthma   . Bronchitis   . Pneumonia   . Ulcerative colitis ([place])     There are no active problems to display for this patient.   Past Surgical History:  Procedure Laterality Date  . COLONOSCOPY WITH PROPOFOL N/A 02/21/2016   Procedure: COLONOSCOPY WITH PROPOFOL;  Surgeon: Arta Silence, MD;  Location: [facility];  Service: Endoscopy;  Laterality: N/A;        Home Medications    Prior to Admission medications   Medication Sig Start Date End Date Taking? Authorizing Provider  acetaminophen (TYLENOL) 500 MG tablet Take 2 tablets (1,000 mg total) by mouth every 6 (six) hours as needed. 08/17/17  Yes Charlesetta Shanks, MD  albuterol (PROVENTIL HFA;VENTOLIN HFA) 108 (90 Base) MCG/ACT inhaler Inhale 1-2 puffs into the lungs every  6 (six) hours as needed for wheezing or shortness of breath. 08/17/17  Yes Pfeiffer, Jeannie Done, MD  cetirizine (ZYRTEC) 10 MG tablet Take 1 tablet (10 mg total) by mouth daily. Patient taking differently: Take 10 mg by mouth daily as needed for allergies.  08/15/17  Yes Palumbo, April, MD  famotidine (PEPCID) 20 MG tablet Take 1 tablet (20 mg total) by mouth 2 (two) times daily. 08/17/17  Yes Pfeiffer, Jeannie Done, MD  Fluticasone-Salmeterol (ADVAIR DISKUS) 100-50 MCG/DOSE AEPB Inhale 1 puff into the lungs 2 (two) times daily. 06/17/17  Yes Robyn Haber, MD  ipratropium (ATROVENT) 0.06 % nasal spray Place 2 sprays into both nostrils 4 (four) times daily. Patient taking differently: Place 2 sprays into both nostrils 4 (four) times daily as needed (congestion).  07/19/17  Yes Yu, Amy V, PA-C  Multiple Vitamin (MULTIVITAMIN WITH MINERALS) TABS tablet Take 1 tablet by mouth daily.   Yes [provider]  sulfaSALAzine (AZULFIDINE) 500 MG tablet Take 1 tablet (500 mg total) 4 (four) times daily by mouth. Patient taking differently: Take 1,000 mg by mouth 3 (three) times daily.  12/01/16  Yes Davonna Belling, MD  fluticasone (FLONASE) 50 MCG/ACT nasal spray Place 1 spray into both nostrils daily. 08/24/17   Deliah Boston, PA-C    Family History No family history on file.  Social History Social History   Tobacco Use  . Smoking status: Former Smoker    Packs/day: 0.50    Types: Cigarettes  . Smokeless tobacco: Never  Used  Substance Use Topics  . Alcohol use: Not Currently    Comment: former  . Drug use: Yes    Types: Cocaine, Marijuana    Comment: last used cocaine 2 days ago     Allergies   Patient has no known allergies.   Review of Systems Review of Systems  Constitutional: Negative.  Negative for chills, fatigue and fever.  HENT: Positive for congestion and rhinorrhea. Negative for ear pain, nosebleeds, sinus pressure, sinus pain, sore throat and trouble swallowing.   Eyes:  Negative.  Negative for visual disturbance.  Respiratory: Negative.  Negative for cough and shortness of breath.   Cardiovascular: Negative.  Negative for chest pain.  Gastrointestinal: Negative.  Negative for abdominal pain, blood in stool, diarrhea, nausea and vomiting.  Genitourinary: Negative.  Negative for difficulty urinating, dysuria and hematuria.  Musculoskeletal: Negative.  Negative for arthralgias, myalgias and neck pain.  Skin: Negative.  Negative for wound.  Neurological: Negative.  Negative for dizziness, seizures, syncope, weakness, light-headedness and headaches.     Physical Exam Updated Vital Signs BP 108/83 (BP Location: Left Arm)   Pulse 81   Temp 98.7 F (37.1 C) (Oral)   Resp 20   SpO2 99%   Physical Exam  Constitutional: He is oriented to person, place, and time. He appears well-developed and well-nourished. No distress.  HENT:  Head: Normocephalic and atraumatic.  Right Ear: Hearing, tympanic membrane, external ear and ear canal normal.  Left Ear: Hearing, tympanic membrane, external ear and ear canal normal.  Nose: Rhinorrhea and nasal deformity present. No epistaxis. Right sinus exhibits no maxillary sinus tenderness and no frontal sinus tenderness. Left sinus exhibits no maxillary sinus tenderness and no frontal sinus tenderness.  Mouth/Throat: Uvula is midline, oropharynx is clear and moist and mucous membranes are normal. No trismus in the jaw. No uvula swelling. No oropharyngeal exudate, posterior oropharyngeal edema, posterior oropharyngeal erythema or tonsillar abscesses.  Patient with large nasal septum perforation.  Eyes: Pupils are equal, round, and reactive to light. EOM are normal.  Neck: Normal range of motion. Neck supple. No JVD present. No tracheal deviation present.  Cardiovascular: Normal rate, regular rhythm, normal heart sounds and intact distal pulses.  Pulmonary/Chest: Effort normal and breath sounds normal. No respiratory distress. He  has no wheezes.  Patient without wheezing during my examination today.  Abdominal: Soft. Bowel sounds are normal. There is no tenderness. There is no rebound and no guarding.  Musculoskeletal: Normal range of motion.  Neurological: He is alert and oriented to person, place, and time.  Skin: Skin is warm and dry. Capillary refill takes less than 2 seconds.  Psychiatric: He has a normal mood and affect. His behavior is normal.    ED Treatments / Results  Labs (all labs ordered are listed, but only abnormal results are displayed) Labs Reviewed - No data to display  EKG None  Radiology No results found.  Procedures Procedures (including critical care time)  Medications Ordered in ED Medications - No data to display   Initial Impression / Assessment and Plan / ED Course  I have reviewed the triage vital signs and the nursing notes.  Pertinent labs & imaging results that were available during my care of the patient were reviewed by me and considered in my medical decision making (see chart for details).    Patient re-evaluated prior to dc, is hemodynamically stable, in no respiratory distress, and denies the feeling of throat closing, normal phonation. No wheezing, no vomiting, no  syncope. Discussed signs and symptoms of anaphylaxis and severe allergic reaction. Pt advised to return for any worsening in symptoms or any concerns.   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  Patient with history of cocaine use.  I have prescribed the patient Flonase to help with his nasal congestion and informed him that he should continue to use his antihistamine, Zyrtec as indicated on the medication package.  I have informed him that it takes time for this medication to work.  Patient is afebrile, no signs suggesting sinusitis or other bacterial infections at this time.  Patient is not short of breath, not wheezing on my denies need of a breathing  treatment at this time.  At this time there does not appear to be any evidence of an acute emergency medical condition and the patient appears stable for discharge with appropriate outpatient follow up. Diagnosis was discussed with patient who verbalizes understanding and is agreeable to discharge. I have discussed return precautions with patient who verbalizes understanding of return precautions. Patient strongly encouraged to follow-up with their PCP.  Patient's case discussed with Dr. Eulis Foster who agrees with plan to discharge with Flonase and with follow-up.   Patient encouraged to stop using cocaine.   This note was dictated using DragonOne dictation software; please contact for any inconsistencies within the note.       Final Clinical Impressions(s) / ED Diagnoses   Final diagnoses:  Nasal congestion  Nasal septal perforation    ED Discharge Orders        Ordered    fluticasone (FLONASE) 50 MCG/ACT nasal spray  Daily     08/24/17 1054       Gari Crown 08/24/17 1604    Daleen Bo, MD 08/24/17 [phone]

## 2017-08-27 ENCOUNTER — Encounter (HOSPITAL_COMMUNITY): Payer: Self-pay | Admitting: Emergency Medicine

## 2017-08-27 ENCOUNTER — Ambulatory Visit (HOSPITAL_COMMUNITY)
Admission: EM | Admit: 2017-08-27 | Discharge: 2017-08-27 | Disposition: A | Discharge status: Home / Self Care | Payer: Self-pay | Attending: Internal Medicine | Admitting: Internal Medicine

## 2017-08-27 DIAGNOSIS — Z8719 Personal history of other diseases of the digestive system: Secondary | ICD-10-CM

## 2017-08-27 DIAGNOSIS — R195 Other fecal abnormalities: Secondary | ICD-10-CM

## 2017-08-27 LAB — POCT I-STAT, CHEM 8
BUN: 12 mg/dL (ref 6–20)
CHLORIDE: 101 mmol/L (ref 98–111)
CREATININE: 1.2 mg/dL (ref 0.61–1.24)
Calcium, Ion: 1.26 mmol/L (ref 1.15–1.40)
GLUCOSE: 88 mg/dL (ref 70–99)
HCT: 43 % (ref 39.0–52.0)
Hemoglobin: 14.6 g/dL (ref 13.0–17.0)
POTASSIUM: 3.7 mmol/L (ref 3.5–5.1)
Sodium: 142 mmol/L (ref 135–145)
TCO2: 32 mmol/L (ref 22–32)

## 2017-08-27 LAB — OCCULT BLOOD, POC DEVICE: Fecal Occult Bld: NEGATIVE

## 2017-08-27 NOTE — ED Triage Notes (Signed)
Pt c/o blood in his stool, states hes got UC, and often has blood in his stool. Pt feels weak. Denies pain at this time. Pt also states he has hemorrhoids and his stool looks dark.

## 2017-08-27 NOTE — ED Provider Notes (Signed)
[facility]   [id number] 08/27/17 Arrival Time: 1629  SUBJECTIVE:  Jerome Irwin is a 37 y.o. male hx significant for ulcerative colitis and hemorrhoids who presents with complaint of intermittent blood in stool that began a couple of weeks ago.   Currently asymptomatic.  Denies a precipitating event, or trauma, but hx significant for hemorrhoids and UC.  Denies associated abdominal pain. Has tried hemorrhoid cream without relief.  Denies alleviating or aggravating factors.  Reports similar symptoms in the past.  Last BM today without blood in stool.  Complains of fatigue, constipation, and hx of BRB with wiping and hx of hematochezia.   Denies fever, chills, appetite changes, weight changes, nausea, vomiting, chest pain, SOB, diarrhea, melena, dysuria, difficulty urinating, increased frequency or urgency, flank pain, loss of bowel or bladder function.  Takes sulfaSALAzine for UC daily.    Last colonoscopy this year and normal.    No LMP for male patient.  ROS: As per HPI.  Past Medical History:  Diagnosis Date  . Asthma   . Bronchitis   . Pneumonia   . Ulcerative colitis [facility])    Past Surgical History:  Procedure Laterality Date  . COLONOSCOPY WITH PROPOFOL N/A 02/21/2016   Procedure: COLONOSCOPY WITH PROPOFOL;  Surgeon: Arta Silence, MD;  Location: [facility];  Service: Endoscopy;  Laterality: N/A;   No Known Allergies No current facility-administered medications on file prior to encounter.    Current Outpatient Medications on File Prior to Encounter  Medication Sig Dispense Refill  . acetaminophen (TYLENOL) 500 MG tablet Take 2 tablets (1,000 mg total) by mouth every 6 (six) hours as needed. 30 tablet 0  . albuterol (PROVENTIL HFA;VENTOLIN HFA) 108 (90 Base) MCG/ACT inhaler Inhale 1-2 puffs into the lungs every 6 (six) hours as needed for wheezing or shortness of breath. 1 Inhaler 0  . cetirizine (ZYRTEC) 10 MG tablet Take 1 tablet (10 mg total) by mouth  daily. (Patient taking differently: Take 10 mg by mouth daily as needed for allergies. ) 30 tablet 0  . famotidine (PEPCID) 20 MG tablet Take 1 tablet (20 mg total) by mouth 2 (two) times daily. 30 tablet 0  . fluticasone (FLONASE) 50 MCG/ACT nasal spray Place 1 spray into both nostrils daily. 9.9 g 2  . Fluticasone-Salmeterol (ADVAIR DISKUS) 100-50 MCG/DOSE AEPB Inhale 1 puff into the lungs 2 (two) times daily. 60 each 11  . ipratropium (ATROVENT) 0.06 % nasal spray Place 2 sprays into both nostrils 4 (four) times daily. (Patient taking differently: Place 2 sprays into both nostrils 4 (four) times daily as needed (congestion). ) 15 mL 0  . Multiple Vitamin (MULTIVITAMIN WITH MINERALS) TABS tablet Take 1 tablet by mouth daily.    Marland Kitchen sulfaSALAzine (AZULFIDINE) 500 MG tablet Take 1 tablet (500 mg total) 4 (four) times daily by mouth. (Patient taking differently: Take 1,000 mg by mouth 3 (three) times daily. ) 120 tablet 0   Social History   Socioeconomic History  . Marital status: Single    Spouse name: Not on file  . Number of children: Not on file  . Years of education: Not on file  . Highest education level: Not on file  Occupational History  . Not on file  Social Needs  . Financial resource strain: Not on file  . Food insecurity:    Worry: Not on file    Inability: Not on file  . Transportation needs:    Medical: Not on file    Non-medical: Not on file  Tobacco Use  . Smoking status: Former Smoker    Packs/day: 0.50    Types: Cigarettes  . Smokeless tobacco: Never Used  Substance and Sexual Activity  . Alcohol use: Not Currently    Comment: former  . Drug use: Yes    Types: Cocaine, Marijuana    Comment: last used cocaine 2 days ago  . Sexual activity: Not on file  Lifestyle  . Physical activity:    Days per week: Not on file    Minutes per session: Not on file  . Stress: Not on file  Relationships  . Social connections:    Talks on phone: Not on file    Gets together:  Not on file    Attends religious service: Not on file    Active member of club or organization: Not on file    Attends meetings of clubs or organizations: Not on file    Relationship status: Not on file  . Intimate partner violence:    Fear of current or ex partner: Not on file    Emotionally abused: Not on file    Physically abused: Not on file    Forced sexual activity: Not on file  Other Topics Concern  . Not on file  Social History Narrative  . Not on file   No family history on file.   OBJECTIVE:  Vitals:   08/27/17 1644 08/27/17 1743  BP: 124/81 132/88  Pulse: (!) 108 90  Resp: 16 16  Temp: 98.4 F (36.9 C) 98.9 F (37.2 C)  TempSrc: Oral Oral  SpO2: 97% 98%    General appearance: AOx3 in no acute distress HEENT: NCAT.  Oropharynx clear.  Lungs: clear to auscultation bilaterally without adventitious breath sounds Heart: regular rate and rhythm.  Radial pulses 2+ symmetrical bilaterally Abdomen: soft, non-distended; normal active bowel sounds; non-tender to light and deep palpation; nontender at McBurney's point; negative Murphy's sign; negative rebound; no guarding Rectal: External exam negative for external hemorrhoid, no obvious masses or  Fissures appreciated. Nontender to palpation. No active bleeding appreciated. Internal: Mild discomfort; no masses appreciated; hemoccult negative  Back: no CVA tenderness Extremities: no edema; symmetrical with no gross deformities Skin: warm and dry Neurologic: normal gait Psychological: alert and cooperative; normal mood and affect  Labs: Results for orders placed or performed during the hospital encounter of 08/27/17 (from the past 24 hour(s))  Occult blood, poc device     Status: None   Collection Time: 08/27/17  5:30 PM  Result Value Ref Range   Fecal Occult Bld NEGATIVE NEGATIVE  I-STAT, chem 8     Status: None   Collection Time: 08/27/17  5:38 PM  Result Value Ref Range   Sodium 142 135 - 145 mmol/L   Potassium  3.7 3.5 - 5.1 mmol/L   Chloride 101 98 - 111 mmol/L   BUN 12 6 - 20 mg/dL   Creatinine, Ser 1.20 0.61 - 1.24 mg/dL   Glucose, Bld 88 70 - 99 mg/dL   Calcium, Ion 1.26 1.15 - 1.40 mmol/L   TCO2 32 22 - 32 mmol/L   Hemoglobin 14.6 13.0 - 17.0 g/dL   HCT 43.0 39.0 - 52.0 %    ASSESSMENT & PLAN:  1. Hx of hemorrhoids   2. Hx of chronic ulcerative colitis     No orders of the defined types were placed in this encounter.  Hemoccult negative Blood work did not show you were anemic Perform sitz water baths Eat a diet high if  fiber and drink plenty of water Use OTC medications as needed for symptomatic relief Follow up with PCP or gastroenterologist if symptoms persists Return or go to the ER if you have any new or worsening symptoms  Reviewed expectations re: course of current medical issues. Questions answered. Outlined signs and symptoms indicating need for more acute intervention. Patient verbalized understanding. After Visit Summary given.   Lestine Box, PA-C 08/27/17 1859

## 2017-08-27 NOTE — Discharge Instructions (Addendum)
Hemoccult negative Blood work did not show you were anemic Perform sitz water baths Eat a diet high if fiber and drink plenty of water Use OTC medications as needed for symptomatic relief Follow up with PCP or gastroenterologist if symptoms persists Return or go to the ER if you have any new or worsening symptoms

## 2017-09-06 ENCOUNTER — Encounter (HOSPITAL_COMMUNITY): Payer: Self-pay | Admitting: *Deleted

## 2017-09-06 ENCOUNTER — Ambulatory Visit (HOSPITAL_COMMUNITY)
Admission: EM | Admit: 2017-09-06 | Discharge: 2017-09-06 | Disposition: A | Discharge status: Home / Self Care | Payer: Self-pay | Attending: Internal Medicine | Admitting: Internal Medicine

## 2017-09-06 ENCOUNTER — Other Ambulatory Visit: Payer: Self-pay

## 2017-09-06 DIAGNOSIS — H60502 Unspecified acute noninfective otitis externa, left ear: Secondary | ICD-10-CM

## 2017-09-06 DIAGNOSIS — H66002 Acute suppurative otitis media without spontaneous rupture of ear drum, left ear: Secondary | ICD-10-CM

## 2017-09-06 DIAGNOSIS — J4540 Moderate persistent asthma, uncomplicated: Secondary | ICD-10-CM

## 2017-09-06 MED ORDER — CEFDINIR 300 MG PO CAPS: 300.0000 mg | ORAL_CAPSULE | Freq: Two times a day (BID) | ORAL | 0 refills | Status: AC

## 2017-09-06 MED ORDER — ALBUTEROL SULFATE HFA 108 (90 BASE) MCG/ACT IN AERS: 1.0000 | INHALATION_SPRAY | Freq: Four times a day (QID) | RESPIRATORY_TRACT | 0 refills | Status: DC | PRN

## 2017-09-06 MED ORDER — FLUTICASONE-SALMETEROL 100-50 MCG/DOSE IN AEPB: 1.0000 | INHALATION_SPRAY | Freq: Two times a day (BID) | RESPIRATORY_TRACT | 0 refills | Status: DC

## 2017-09-06 MED ORDER — NEOMYCIN-POLYMYXIN-HC 3.5-10000-1 OT SUSP: 4.0000 [drp] | Freq: Three times a day (TID) | OTIC | 0 refills | Status: AC

## 2017-09-06 NOTE — Discharge Instructions (Signed)
I have refilled your albuterol inhaler as well as Advair inhaler Please continue to use as needed for shortness of breath and wheezing  Please begin Omnicef twice daily for the next 10 days Please use Cortisporin eardrops in the left ear 3 times a day for the next week  Please follow-up if symptoms worsening, developing worsening shortness of breath, difficulty breathing, fevers, persistent ear pain, developing new symptoms.

## 2017-09-06 NOTE — ED Triage Notes (Signed)
C/O feeling wheezy "like I have bronchitis" since yesterday.  Also c/o left earache.

## 2017-09-07 NOTE — ED Provider Notes (Signed)
[facility]    CSN: [id number] Arrival date & time: 09/06/17  1553     History   Chief Complaint Chief Complaint  Patient presents with  . Wheezing  . Otalgia    HPI Jerome Irwin is a 37 y.o. male history of asthma presenting today for evaluation of left ear pain and concern for worsening asthma. He states for the past 3 days he has had pain in his left ear. He has had some associated congestion, but denies sore throat, coughing and fevers. He feels like over the past day he has had slight chest tightness and wheezing at times. Is out of his albuterol inhaler. Denies SOB and chest pain.   HPI  Past Medical History:  Diagnosis Date  . Asthma   . Bronchitis   . Pneumonia   . Ulcerative colitis ([place])     There are no active problems to display for this patient.   Past Surgical History:  Procedure Laterality Date  . COLONOSCOPY WITH PROPOFOL N/A 02/21/2016   Procedure: COLONOSCOPY WITH PROPOFOL;  Surgeon: Arta Silence, MD;  Location: [facility];  Service: Endoscopy;  Laterality: N/A;       Home Medications    Prior to Admission medications   Medication Sig Start Date End Date Taking? Authorizing Provider  albuterol (PROVENTIL HFA;VENTOLIN HFA) 108 (90 Base) MCG/ACT inhaler Inhale 1-2 puffs into the lungs every 6 (six) hours as needed for wheezing or shortness of breath. 09/06/17   Happy Ky C, PA-C  cefdinir (OMNICEF) 300 MG capsule Take 1 capsule (300 mg total) by mouth 2 (two) times daily for 10 days. 09/06/17 09/16/17  Braydyn Schultes C, PA-C  Fluticasone-Salmeterol (ADVAIR) 100-50 MCG/DOSE AEPB Inhale 1 puff into the lungs 2 (two) times daily. 09/06/17   Mahnoor Mathisen C, PA-C  neomycin-polymyxin-hydrocortisone (CORTISPORIN) 3.5-10000-1 OTIC suspension Place 4 drops into the left ear 3 (three) times daily for 7 days. 09/06/17 09/13/17  Kaiea Esselman, Elesa Hacker, PA-C    Family History Family History  Problem Relation Age of Onset  . Healthy Mother     . Healthy Father     Social History Social History   Tobacco Use  . Smoking status: Former Smoker    Packs/day: 0.50    Types: Cigarettes  . Smokeless tobacco: Never Used  Substance Use Topics  . Alcohol use: Not Currently    Comment: former  . Drug use: Not Currently    Types: Cocaine, Marijuana    Comment: last used cocaine 2 days ago     Allergies   Patient has no known allergies.   Review of Systems Review of Systems  Constitutional: Negative for activity change, appetite change, chills, fatigue and fever.  HENT: Positive for congestion and ear pain. Negative for rhinorrhea, sinus pressure, sore throat and trouble swallowing.   Eyes: Negative for discharge and redness.  Respiratory: Positive for chest tightness and wheezing. Negative for cough and shortness of breath.   Cardiovascular: Negative for chest pain.  Gastrointestinal: Negative for abdominal pain, diarrhea, nausea and vomiting.  Musculoskeletal: Negative for myalgias.  Skin: Negative for rash.  Neurological: Negative for dizziness, light-headedness and headaches.     Physical Exam Triage Vital Signs ED Triage Vitals  Enc Vitals Group     BP 09/06/17 1709 114/85     Pulse --      Resp 09/06/17 1709 16     Temp 09/06/17 1709 (!) 97.5 F (36.4 C)     Temp Source 09/06/17 1709 Oral  SpO2 09/06/17 1709 98 %     Weight --      Height --      Head Circumference --      Peak Flow --      Pain Score 09/06/17 1710 6     Pain Loc --      Pain Edu? --      Excl. in [place]? --    No data found.  Updated Vital Signs BP 114/85   Temp (!) 97.5 F (36.4 C) (Oral)   Resp 16   SpO2 98%   Visual Acuity Right Eye Distance:   Left Eye Distance:   Bilateral Distance:    Right Eye Near:   Left Eye Near:    Bilateral Near:     Physical Exam  Constitutional: He appears well-developed and well-nourished.  HENT:  Head: Normocephalic and atraumatic.  Left TM, erythematous, dull and appears  irregular Left EAC erythematous with white debris along canal  Oral mucosa pink and moist, no tonsillar enlargement or exudate. Posterior pharynx patent and nonerythematous, no uvula deviation or swelling. Normal phonation.   Eyes: Conjunctivae are normal.  Neck: Neck supple.  Cardiovascular: Normal rate and regular rhythm.  No murmur heard. Pulmonary/Chest: Effort normal and breath sounds normal. No respiratory distress.  Breathing comfortably at rest, CTABL, no wheezing, rales or other adventitious sounds auscultated  Abdominal: Soft. There is no tenderness.  Musculoskeletal: He exhibits no edema.  Neurological: He is alert.  Skin: Skin is warm and dry.  Psychiatric: He has a normal mood and affect.  Nursing note and vitals reviewed.    UC Treatments / Results  Labs (all labs ordered are listed, but only abnormal results are displayed) Labs Reviewed - No data to display  EKG None  Radiology No results found.  Procedures Procedures (including critical care time)  Medications Ordered in UC Medications - No data to display  Initial Impression / Assessment and Plan / UC Course  I have reviewed the triage vital signs and the nursing notes.  Pertinent labs & imaging results that were available during my care of the patient were reviewed by me and considered in my medical decision making (see chart for details).     Patient with otitis externa and media will treat with omnicef and cortisporin. No wheezing or other adventitious sounds auscultated, VSS, will provide patient with albuterol inhaler and refill his advair to continue to use as needed. Discussed strict return precautions. Patient verbalized understanding and is agreeable with plan.  Final Clinical Impressions(s) / UC Diagnoses   Final diagnoses:  Acute otitis externa of left ear, unspecified type  Non-recurrent acute suppurative otitis media of left ear without spontaneous rupture of tympanic membrane  Moderate  persistent asthma without complication     Discharge Instructions     I have refilled your albuterol inhaler as well as Advair inhaler Please continue to use as needed for shortness of breath and wheezing  Please begin Omnicef twice daily for the next 10 days Please use Cortisporin eardrops in the left ear 3 times a day for the next week  Please follow-up if symptoms worsening, developing worsening shortness of breath, difficulty breathing, fevers, persistent ear pain, developing new symptoms.   ED Prescriptions    Medication Sig Dispense Auth. Provider   albuterol (PROVENTIL HFA;VENTOLIN HFA) 108 (90 Base) MCG/ACT inhaler Inhale 1-2 puffs into the lungs every 6 (six) hours as needed for wheezing or shortness of breath. 1 Inhaler Calel Pisarski, Erwin C, PA-C  Fluticasone-Salmeterol (ADVAIR) 100-50 MCG/DOSE AEPB Inhale 1 puff into the lungs 2 (two) times daily. 60 each Eltha Tingley C, PA-C   cefdinir (OMNICEF) 300 MG capsule Take 1 capsule (300 mg total) by mouth 2 (two) times daily for 10 days. 20 capsule Alfonzia Woolum C, PA-C   neomycin-polymyxin-hydrocortisone (CORTISPORIN) 3.5-10000-1 OTIC suspension Place 4 drops into the left ear 3 (three) times daily for 7 days. 10 mL Madalaine Portier C, PA-C     Controlled Substance Prescriptions [place] Controlled Substance Registry consulted? Not Applicable   Janith Lima, [place] 09/07/17 [phone]

## 2017-09-13 ENCOUNTER — Other Ambulatory Visit: Payer: Self-pay

## 2017-09-13 ENCOUNTER — Emergency Department (HOSPITAL_COMMUNITY)
Admission: EM | Admit: 2017-09-13 | Discharge: 2017-09-13 | Disposition: A | Discharge status: Home / Self Care | Payer: Self-pay | Attending: Emergency Medicine | Admitting: Emergency Medicine

## 2017-09-13 ENCOUNTER — Encounter (HOSPITAL_COMMUNITY): Payer: Self-pay | Admitting: *Deleted

## 2017-09-13 DIAGNOSIS — Z79899 Other long term (current) drug therapy: Secondary | ICD-10-CM | POA: Insufficient documentation

## 2017-09-13 DIAGNOSIS — J45909 Unspecified asthma, uncomplicated: Secondary | ICD-10-CM | POA: Insufficient documentation

## 2017-09-13 DIAGNOSIS — J069 Acute upper respiratory infection, unspecified: Secondary | ICD-10-CM | POA: Insufficient documentation

## 2017-09-13 DIAGNOSIS — Z87891 Personal history of nicotine dependence: Secondary | ICD-10-CM | POA: Insufficient documentation

## 2017-09-13 MED ORDER — ALBUTEROL SULFATE (2.5 MG/3ML) 0.083% IN NEBU
5.0000 mg | INHALATION_SOLUTION | Freq: Once | RESPIRATORY_TRACT | Status: AC
  Administered 2017-09-13: 5 mg via RESPIRATORY_TRACT
  Filled 2017-09-13: qty 6

## 2017-09-13 NOTE — ED Triage Notes (Signed)
Pt stated "I've not been feeling good the past few days.  I've had bronchitis before.  I use an inhaler.  Just been short of breath."

## 2017-09-13 NOTE — ED Provider Notes (Signed)
[facility] DEPT Provider Note   CSN: [id number] Arrival date & time: 09/13/17  0403     History   Chief Complaint Chief Complaint  Patient presents with  . Shortness of Breath    HPI Jerome Irwin is a 37 y.o. male.  Patient presents to the emergency department with a chief complaint of his breath.  He feels like he has bronchitis.  He denies any fevers.  He states that he recently stopped smoking.  He denies any productive cough.  He reports that he is taking antibiotics for ear infection.  He has an inhaler at home.  There are no aggravating factors.  The history is provided by the patient. No language interpreter was used.    Past Medical History:  Diagnosis Date  . Asthma   . Bronchitis   . Pneumonia   . Ulcerative colitis ([place])     There are no active problems to display for this patient.   Past Surgical History:  Procedure Laterality Date  . COLONOSCOPY WITH PROPOFOL N/A 02/21/2016   Procedure: COLONOSCOPY WITH PROPOFOL;  Surgeon: Arta Silence, MD;  Location: [facility];  Service: Endoscopy;  Laterality: N/A;        Home Medications    Prior to Admission medications   Medication Sig Start Date End Date Taking? Authorizing Provider  albuterol (PROVENTIL HFA;VENTOLIN HFA) 108 (90 Base) MCG/ACT inhaler Inhale 1-2 puffs into the lungs every 6 (six) hours as needed for wheezing or shortness of breath. 09/06/17   Wieters, Hallie C, PA-C  cefdinir (OMNICEF) 300 MG capsule Take 1 capsule (300 mg total) by mouth 2 (two) times daily for 10 days. 09/06/17 09/16/17  Wieters, Hallie C, PA-C  Fluticasone-Salmeterol (ADVAIR) 100-50 MCG/DOSE AEPB Inhale 1 puff into the lungs 2 (two) times daily. 09/06/17   Wieters, Hallie C, PA-C  neomycin-polymyxin-hydrocortisone (CORTISPORIN) 3.5-10000-1 OTIC suspension Place 4 drops into the left ear 3 (three) times daily for 7 days. 09/06/17 09/13/17  Wieters, Elesa Hacker, PA-C    Family History Family  History  Problem Relation Age of Onset  . Healthy Mother   . Healthy Father     Social History Social History   Tobacco Use  . Smoking status: Former Smoker    Packs/day: 0.00  . Smokeless tobacco: Never Used  Substance Use Topics  . Alcohol use: Not Currently    Comment: former  . Drug use: Not Currently    Types: Cocaine, Marijuana    Comment: last used cocaine 2 weeks ago     Allergies   Patient has no known allergies.   Review of Systems Review of Systems  All other systems reviewed and are negative.    Physical Exam Updated Vital Signs BP (!) 132/91 (BP Location: Right Arm)   Pulse 75   Temp 97.9 F (36.6 C)   Resp 18   Ht 5' 11"  (1.803 m)   Wt 90.7 kg   SpO2 96%   BMI 27.89 kg/m   Physical Exam  Constitutional: He appears well-developed and well-nourished. No distress.  HENT:  Head: Normocephalic.  Right Ear: External ear normal.  Left Ear: External ear normal.  Mildly erythematous, no tonsillar exudate, no abscess, no stridor, uvula is midline  TMs clear bilaterally  Eyes: Pupils are equal, round, and reactive to light. Conjunctivae and EOM are normal.  Neck: Normal range of motion. Neck supple.  Cardiovascular: Normal rate, regular rhythm and normal heart sounds. Exam reveals no gallop and no friction rub.  No murmur heard. Pulmonary/Chest: Effort normal and breath sounds normal. No stridor. No respiratory distress. He has no wheezes. He has no rales. He exhibits no tenderness.  CTAB  Abdominal: Soft. Bowel sounds are normal. He exhibits no distension. There is no tenderness.  Musculoskeletal: Normal range of motion. He exhibits no tenderness.  Neurological: He is alert.  Skin: Skin is warm and dry. No rash noted. He is not diaphoretic.  Psychiatric: He has a normal mood and affect. His behavior is normal. Judgment and thought content normal.  Nursing note and vitals reviewed.    ED Treatments / Results  Labs (all labs ordered are listed,  but only abnormal results are displayed) Labs Reviewed - No data to display  EKG None  Radiology No results found.  Procedures Procedures (including critical care time)  Medications Ordered in ED Medications  albuterol (PROVENTIL) (2.5 MG/3ML) 0.083% nebulizer solution 5 mg (5 mg Nebulization Given 09/13/17 0443)     Initial Impression / Assessment and Plan / ED Course  I have reviewed the triage vital signs and the nursing notes.  Pertinent labs & imaging results that were available during my care of the patient were reviewed by me and considered in my medical decision making (see chart for details).    Patients symptoms are consistent with URI, likely viral etiology. Discussed that antibiotics are not indicated for viral infections. Pt will be discharged with symptomatic treatment.  Verbalizes understanding and is agreeable with plan. Pt is hemodynamically stable & in NAD prior to dc.   Final Clinical Impressions(s) / ED Diagnoses   Final diagnoses:  Upper respiratory tract infection, unspecified type    ED Discharge Orders    None       Montine Circle, PA-C 09/13/17 [place], [place], DO 09/13/17 [phone]

## 2017-09-17 ENCOUNTER — Encounter (HOSPITAL_COMMUNITY): Payer: Self-pay

## 2017-09-17 ENCOUNTER — Ambulatory Visit (HOSPITAL_COMMUNITY)
Admission: EM | Admit: 2017-09-17 | Discharge: 2017-09-17 | Disposition: A | Discharge status: Home / Self Care | Payer: Self-pay | Attending: Family Medicine | Admitting: Family Medicine

## 2017-09-17 DIAGNOSIS — J4521 Mild intermittent asthma with (acute) exacerbation: Secondary | ICD-10-CM

## 2017-09-17 DIAGNOSIS — K6289 Other specified diseases of anus and rectum: Secondary | ICD-10-CM

## 2017-09-17 MED ORDER — CLOTRIMAZOLE 1 % EX CREA: TOPICAL_CREAM | CUTANEOUS | 0 refills | Status: DC

## 2017-09-17 MED ORDER — IPRATROPIUM-ALBUTEROL 0.5-2.5 (3) MG/3ML IN SOLN
3.0000 mL | Freq: Once | RESPIRATORY_TRACT | Status: AC
  Administered 2017-09-17: 3 mL via RESPIRATORY_TRACT

## 2017-09-17 MED ORDER — IPRATROPIUM-ALBUTEROL 0.5-2.5 (3) MG/3ML IN SOLN
RESPIRATORY_TRACT | Status: AC
  Filled 2017-09-17: qty 3

## 2017-09-17 MED ORDER — MUPIROCIN 2 % EX OINT: 1.0000 "application " | TOPICAL_OINTMENT | Freq: Two times a day (BID) | CUTANEOUS | 0 refills | Status: DC

## 2017-09-17 NOTE — ED Notes (Signed)
In room while amy, pa performed rectal exam.

## 2017-09-17 NOTE — ED Provider Notes (Signed)
[facility]    CSN: [id number] Arrival date & time: 09/17/17  1353     History   Chief Complaint Chief Complaint  Patient presents with  . Shortness of Breath    HPI Jerome Irwin is a 37 y.o. male.   37 year old male with history of asthma, ulcerative colitis comes in for multiple complaints.  He would like a breathing treatment.  States for the past week, has been dealing with viral URI, was seen in the emergency department 4 days ago for similar symptoms. At that time, he was on cefinir for otitis media and continued with cough, congestion, rhinorrhea. Has been using his inhalers, but states better relief with nebulizer, and came in for one.  He denies chest pain.  Feels short of breath/wheezing that is slightly improved with albuterol inhaler.  States has quit smoking recently, but still exposed to secondhand smoke. He denies worsening of symptoms.   Rectal pain for the past week.  Denies injury/trauma.  States pain only with bowel movement.  Currently with soft stools, but has history of hard stools with hemorrhoids.  He denies bright red blood on wiping, hematochezia, melena.  Has not tried anything for the symptoms.  Denies abdominal pain, nausea, vomiting.  Denies fever, chills, night sweats.     Past Medical History:  Diagnosis Date  . Asthma   . Bronchitis   . Pneumonia   . Ulcerative colitis ([place])     There are no active problems to display for this patient.   Past Surgical History:  Procedure Laterality Date  . COLONOSCOPY WITH PROPOFOL N/A 02/21/2016   Procedure: COLONOSCOPY WITH PROPOFOL;  Surgeon: Arta Silence, MD;  Location: [facility];  Service: Endoscopy;  Laterality: N/A;       Home Medications    Prior to Admission medications   Medication Sig Start Date End Date Taking? Authorizing Provider  albuterol (PROVENTIL HFA;VENTOLIN HFA) 108 (90 Base) MCG/ACT inhaler Inhale 1-2 puffs into the lungs every 6 (six) hours as needed for  wheezing or shortness of breath. 09/06/17   Wieters, Hallie C, PA-C  clotrimazole (LOTRIMIN) 1 % cream Apply to affected area 2 times daily 09/17/17   Tasia Catchings, Kimaya Whitlatch V, PA-C  Fluticasone-Salmeterol (ADVAIR) 100-50 MCG/DOSE AEPB Inhale 1 puff into the lungs 2 (two) times daily. 09/06/17   Wieters, Hallie C, PA-C  mupirocin ointment (BACTROBAN) 2 % Apply 1 application topically 2 (two) times daily. 09/17/17   Ok Edwards, PA-C    Family History Family History  Problem Relation Age of Onset  . Healthy Mother   . Healthy Father     Social History Social History   Tobacco Use  . Smoking status: Former Smoker    Packs/day: 0.00  . Smokeless tobacco: Never Used  Substance Use Topics  . Alcohol use: Not Currently    Comment: former  . Drug use: Not Currently    Types: Cocaine, Marijuana    Comment: last used cocaine 2 weeks ago     Allergies   Patient has no known allergies.   Review of Systems Review of Systems  Reason unable to perform ROS: See HPI as above.     Physical Exam Triage Vital Signs ED Triage Vitals  Enc Vitals Group     BP 09/17/17 1403 113/68     Pulse Rate 09/17/17 1403 77     Resp 09/17/17 1403 18     Temp 09/17/17 1403 98.5 F (36.9 C)     Temp src --  SpO2 09/17/17 1403 99 %     Weight 09/17/17 1404 205 lb (93 kg)     Height --      Head Circumference --      Peak Flow --      Pain Score 09/17/17 1403 0     Pain Loc --      Pain Edu? --      Excl. in [place]? --    No data found.  Updated Vital Signs BP 113/68   Pulse 77   Temp 98.5 F (36.9 C)   Resp 18   Wt 205 lb (93 kg)   SpO2 99%   BMI 28.59 kg/m   Physical Exam  Constitutional: He is oriented to person, place, and time. He appears well-developed and well-nourished.  Non-toxic appearance. He does not appear ill. No distress.  HENT:  Head: Normocephalic and atraumatic.  Right Ear: Tympanic membrane, external ear and ear canal normal. Tympanic membrane is not erythematous and not bulging.    Left Ear: Tympanic membrane, external ear and ear canal normal. Tympanic membrane is not erythematous and not bulging.  Nose: Nose normal. Right sinus exhibits no maxillary sinus tenderness and no frontal sinus tenderness. Left sinus exhibits no maxillary sinus tenderness and no frontal sinus tenderness.  Mouth/Throat: Uvula is midline, oropharynx is clear and moist and mucous membranes are normal.  Eyes: Pupils are equal, round, and reactive to light. Conjunctivae are normal.  Neck: Normal range of motion. Neck supple.  Cardiovascular: Normal rate, regular rhythm and normal heart sounds. Exam reveals no gallop and no friction rub.  No murmur heard. Pulmonary/Chest: Effort normal and breath sounds normal. No accessory muscle usage. No respiratory distress. He has no decreased breath sounds. He has no wheezes. He has no rhonchi. He has no rales.  Patient speaking in full sentences without difficulty. Breathing even with normal effort.  Genitourinary:  Genitourinary Comments: Chaperone present.  No external hemorrhoids visualized.  Mild erythema around rectum, extending up to the gluteal cleft with yellow discharge, cracking of the skin.  No swelling, warmth.  No fluctuance felt.  Rectal exam with no tenderness with insertion, no tenderness to the rectal wall.  Lymphadenopathy:    He has no cervical adenopathy.  Neurological: He is alert and oriented to person, place, and time.  Skin: Skin is warm and dry.  Psychiatric: He has a normal mood and affect. His behavior is normal. Judgment normal.     UC Treatments / Results  Labs (all labs ordered are listed, but only abnormal results are displayed) Labs Reviewed - No data to display  EKG None  Radiology No results found.  Procedures Procedures (including critical care time)  Medications Ordered in UC Medications  ipratropium-albuterol (DUONEB) 0.5-2.5 (3) MG/3ML nebulizer solution 3 mL (3 mLs Nebulization Given 09/17/17 1455)     Initial Impression / Assessment and Plan / UC Course  I have reviewed the triage vital signs and the nursing notes.  Pertinent labs & imaging results that were available during my care of the patient were reviewed by me and considered in my medical decision making (see chart for details).    Patient states improvement with DuoNeb treatment.  Lungs continue to be clear to auscultation bilaterally without adventitious lung sounds.  States shortness of breath resolved.  Will have patient follow-up with PCP for further evaluation needed.  Patient without warmth to the rectum without fluctuance, no tenderness to the rectal wall, low suspicion for rectal abscess at this time.  Will cover for fungal infection to the rectal area.  Start clotrimazole as directed, and Bactrim ointment as directed.  Patient to try sitz bath for symptoms.  Avoid hard stools/straining.  Patient to follow-up with PCP for further evaluation.  Return precautions given.  Patient expresses understanding and agrees to plan.  Case discussed with Dr Mannie Stabile, who agrees to plan.  Final Clinical Impressions(s) / UC Diagnoses   Final diagnoses:  Mild intermittent asthma with acute exacerbation  Rectal pain    ED Prescriptions    Medication Sig Dispense Auth. Provider   clotrimazole (LOTRIMIN) 1 % cream Apply to affected area 2 times daily 15 g Duc Crocket V, PA-C   mupirocin ointment (BACTROBAN) 2 % Apply 1 application topically 2 (two) times daily. 22 g Tobin Chad, [place] 09/17/17 1533

## 2017-09-17 NOTE — Discharge Instructions (Signed)
Continue albuterol as needed for wheezing/shortness of breath. Follow up with PCP for recheck if symptoms not improving.  Start lotrimin and bactroban as directed. Lotrimin cream first, then use bactroban on top. This will treat for yeast infection from moisture, and also cover for bacterial infection. Sitz baths. Avoid straining. Follow up with your PCP for reevaluation if symptoms not improving. If increase in rectal pain, with swelling, fever, go to the emergency department for further evaluation needed.

## 2017-09-17 NOTE — ED Triage Notes (Signed)
Rectum feel like it has a cut . X 1 week  SOB x 1 month.

## 2017-09-20 ENCOUNTER — Encounter (HOSPITAL_COMMUNITY): Payer: Self-pay | Admitting: Emergency Medicine

## 2017-09-20 ENCOUNTER — Emergency Department (HOSPITAL_COMMUNITY)
Admission: EM | Admit: 2017-09-20 | Discharge: 2017-09-20 | Disposition: A | Discharge status: Home / Self Care | Payer: Self-pay | Attending: Emergency Medicine | Admitting: Emergency Medicine

## 2017-09-20 DIAGNOSIS — R059 Cough, unspecified: Secondary | ICD-10-CM

## 2017-09-20 DIAGNOSIS — R0602 Shortness of breath: Secondary | ICD-10-CM

## 2017-09-20 DIAGNOSIS — Z87891 Personal history of nicotine dependence: Secondary | ICD-10-CM | POA: Insufficient documentation

## 2017-09-20 DIAGNOSIS — J45909 Unspecified asthma, uncomplicated: Secondary | ICD-10-CM | POA: Insufficient documentation

## 2017-09-20 DIAGNOSIS — R05 Cough: Secondary | ICD-10-CM | POA: Insufficient documentation

## 2017-09-20 MED ORDER — IPRATROPIUM-ALBUTEROL 0.5-2.5 (3) MG/3ML IN SOLN
3.0000 mL | Freq: Once | RESPIRATORY_TRACT | Status: AC
  Administered 2017-09-20: 3 mL via RESPIRATORY_TRACT
  Filled 2017-09-20: qty 3

## 2017-09-20 MED ORDER — DEXAMETHASONE 4 MG PO TABS
10.0000 mg | ORAL_TABLET | Freq: Once | ORAL | Status: AC
  Administered 2017-09-20: 10 mg via ORAL
  Filled 2017-09-20: qty 2

## 2017-09-20 NOTE — ED Provider Notes (Signed)
Emergency Department Provider Note   I have reviewed the triage vital signs and the nursing notes.   HISTORY  Chief Complaint Cough   HPI Jerome Irwin is a 37 y.o. male with a history of ulcerative colitis on sulfasalazine and asthma on long-acting bronchodilators along with as needed albuterol the presents to the emergency department today for shortness of breath.  Patient states that he woke up this morning felt significantly short of breath.  He tried his inhaler which seemed to help some but then they got bad again to come here for further evaluation.  No fever but does have a cough.  Has had intermittent ear infections recently.  Patient states that he does not know what is going on he quit smoking and things seem to have gotten worse.  No leg swelling or recent surgeries. No other associated or modifying symptoms.    Past Medical History:  Diagnosis Date  . Asthma   . Bronchitis   . Pneumonia   . Ulcerative colitis ([place])     There are no active problems to display for this patient.   Past Surgical History:  Procedure Laterality Date  . COLONOSCOPY WITH PROPOFOL N/A 02/21/2016   Procedure: COLONOSCOPY WITH PROPOFOL;  Surgeon: Arta Silence, MD;  Location: [facility];  Service: Endoscopy;  Laterality: N/A;    Current Outpatient Rx  . Order #: [id number] Class: Normal  . Order #: [id number] Class: Historical Med  . Order #: [id number] Class: Normal  . Order #: [id number] Class: Normal  . Order #: [id number] Class: Normal    Allergies Patient has no known allergies.  Family History  Problem Relation Age of Onset  . Healthy Mother   . Healthy Father     Social History Social History   Tobacco Use  . Smoking status: Former Smoker    Packs/day: 0.00  . Smokeless tobacco: Never Used  Substance Use Topics  . Alcohol use: Not Currently    Comment: former  . Drug use: Not Currently    Types: Cocaine, Marijuana    Comment: last used cocaine 2 weeks ago     Review of Systems  All other systems negative except as documented in the HPI. All pertinent positives and negatives as reviewed in the HPI. ____________________________________________   PHYSICAL EXAM:  VITAL SIGNS: ED Triage Vitals [09/20/17 0751]  Enc Vitals Group     BP (!) 141/99     Pulse Rate 72     Resp 17     Temp 98 F (36.7 C)     Temp Source Oral     SpO2 99 %     Weight      Height      Head Circumference      Peak Flow      Pain Score 0     Pain Loc      Pain Edu?      Excl. in [place]?     Constitutional: Alert and oriented. Well appearing and in no acute distress. Eyes: Conjunctivae are normal. PERRL. EOMI. Head: Atraumatic. Nose: No congestion/rhinnorhea. Mouth/Throat: Mucous membranes are moist.  Oropharynx non-erythematous. Neck: No stridor.  No meningeal signs.   Cardiovascular: Normal rate, regular rhythm. Good peripheral circulation. Grossly normal heart sounds.   Respiratory: Normal respiratory effort.  No retractions. Lungs CTAB. Gastrointestinal: Soft and nontender. No distention.  Musculoskeletal: No lower extremity tenderness nor edema. No gross deformities of extremities. Neurologic:  Normal speech and language. No gross focal neurologic deficits are appreciated.  Skin:  Skin is warm, dry and intact. No rash noted.   ____________________________________________   LABS (all labs ordered are listed, but only abnormal results are displayed)  Labs Reviewed - No data to display ____________________________________________  EKG   EKG Interpretation  Date/Time:  Saturday September 20 2017 08:46:44 EDT Ventricular Rate:  67 PR Interval:    QRS Duration: 81 QT Interval:  397 QTC Calculation: 420 R Axis:   62 Text Interpretation:  Sinus rhythm ST elev, probable normal early repol pattern ST changes similar to july 2019 Confirmed by Merrily Pew [phone]) on 09/20/2017 9:14:13 AM        ____________________________________________  RADIOLOGY  No results found.  ____________________________________________   PROCEDURES  Procedure(s) performed:   Procedures   ____________________________________________   INITIAL IMPRESSION / ASSESSMENT AND PLAN / ED COURSE  Lungs clear. Considered PE but low risk/negative PERC making unlikely. Will ecg for any changes cardiac wise, but overall patient appears well can follow-up with his primary doctor to try to get pulmonology appointment. No indication for imaging at this time.   ecg essentially unchanged. Stable for dc at this time.   Pertinent labs & imaging results that were available during my care of the patient were reviewed by me and considered in my medical decision making (see chart for details).  ____________________________________________  FINAL CLINICAL IMPRESSION(S) / ED DIAGNOSES  Final diagnoses:  Cough  SOB (shortness of breath)     MEDICATIONS GIVEN DURING THIS VISIT:  Medications  dexamethasone (DECADRON) tablet 10 mg (has no administration in time range)  ipratropium-albuterol (DUONEB) 0.5-2.5 (3) MG/3ML nebulizer solution 3 mL (3 mLs Nebulization Given 09/20/17 0840)     NEW OUTPATIENT MEDICATIONS STARTED DURING THIS VISIT:  New Prescriptions   No medications on file    Note:  This note was prepared with assistance of Dragon voice recognition software. Occasional wrong-word or sound-a-like substitutions may have occurred due to the inherent limitations of voice recognition software.   Malaisha Silliman, Corene Cornea, MD 09/20/17 [phone]

## 2017-09-20 NOTE — ED Triage Notes (Signed)
Patient here from home with complaints of wheezing with cough x2 days. Denies asthma. States that he was diagnosed with bronchitis.

## 2017-09-22 ENCOUNTER — Ambulatory Visit (HOSPITAL_COMMUNITY)
Admission: EM | Admit: 2017-09-22 | Discharge: 2017-09-22 | Disposition: A | Discharge status: Home / Self Care | Payer: Self-pay | Attending: Family Medicine | Admitting: Family Medicine

## 2017-09-22 ENCOUNTER — Encounter (HOSPITAL_COMMUNITY): Payer: Self-pay

## 2017-09-22 ENCOUNTER — Ambulatory Visit (INDEPENDENT_AMBULATORY_CARE_PROVIDER_SITE_OTHER): Payer: Self-pay

## 2017-09-22 DIAGNOSIS — J4521 Mild intermittent asthma with (acute) exacerbation: Secondary | ICD-10-CM

## 2017-09-22 DIAGNOSIS — J4520 Mild intermittent asthma, uncomplicated: Secondary | ICD-10-CM

## 2017-09-22 DIAGNOSIS — R059 Cough, unspecified: Secondary | ICD-10-CM

## 2017-09-22 DIAGNOSIS — R05 Cough: Secondary | ICD-10-CM

## 2017-09-22 DIAGNOSIS — R0789 Other chest pain: Secondary | ICD-10-CM

## 2017-09-22 MED ORDER — IPRATROPIUM-ALBUTEROL 0.5-2.5 (3) MG/3ML IN SOLN
RESPIRATORY_TRACT | Status: AC
  Filled 2017-09-22: qty 3

## 2017-09-22 MED ORDER — PREDNISONE 20 MG PO TABS: 40.0000 mg | ORAL_TABLET | Freq: Every day | ORAL | 0 refills | Status: AC

## 2017-09-22 MED ORDER — IPRATROPIUM-ALBUTEROL 0.5-2.5 (3) MG/3ML IN SOLN
3.0000 mL | Freq: Once | RESPIRATORY_TRACT | Status: AC
  Administered 2017-09-22: 3 mL via RESPIRATORY_TRACT

## 2017-09-22 NOTE — Discharge Instructions (Signed)
Chest xray is reassuring today.  No fevers, pain or shortness of breath. Will provide 5 days of oral steroids.  Continue to use daily steroid inhaler.  I would recommend more regular use of your albuterol inhaler to help with tightness.  Please follow up with your primary care provider for repeat evaluation in the next week.

## 2017-09-22 NOTE — ED Provider Notes (Signed)
[place]    CSN: [id number] Arrival date & time: 09/22/17  1608     History   Chief Complaint Chief Complaint  Patient presents with  . Chest Congestion    HPI [name] is a 37 y.o. male.   Jerome Irwin presents with complaints of persistent "chest congestion"  Cough is productive of green mucus. Has been ongoing for at least a month now. Has been seen in UC and in ED 3 times in the past 10 days for similar. No fevers. No obvious shortness of breath  But states chest feels tight and difficult in taking deep breath. Was provided 1 time dose of decadron in ER two days ago and felt briefly improved. Mild nasal congestion. No ear or throat pain. No rash. Stopped smoking recently. Uses daily steroid inhaler. Has albuterol inhaler for PRN use but has not been using. Hx of asthma, bronchitis, pneumonia, UC.     ROS per HPI.      Past Medical History:  Diagnosis Date  . Asthma   . Bronchitis   . Pneumonia   . Ulcerative colitis ([place])     There are no active problems to display for this patient.   Past Surgical History:  Procedure Laterality Date  . COLONOSCOPY WITH PROPOFOL N/A 02/21/2016   Procedure: COLONOSCOPY WITH PROPOFOL;  Surgeon: Arta Silence, MD;  Location: [facility];  Service: Endoscopy;  Laterality: N/A;       Home Medications    Prior to Admission medications   Medication Sig Start Date End Date Taking? Authorizing Provider  albuterol (PROVENTIL HFA;VENTOLIN HFA) 108 (90 Base) MCG/ACT inhaler Inhale 1-2 puffs into the lungs every 6 (six) hours as needed for wheezing or shortness of breath. 09/06/17   Wieters, Hallie C, PA-C  cetirizine (ZYRTEC) 5 MG tablet Take 10 mg by mouth daily.    [provider]  clotrimazole (LOTRIMIN) 1 % cream Apply to affected area 2 times daily 09/17/17   Tasia Catchings, Amy V, PA-C  Fluticasone-Salmeterol (ADVAIR) 100-50 MCG/DOSE AEPB Inhale 1 puff into the lungs 2 (two) times daily. 09/06/17   Wieters, Hallie C,  PA-C  mupirocin ointment (BACTROBAN) 2 % Apply 1 application topically 2 (two) times daily. 09/17/17   Tasia Catchings, Amy V, PA-C  predniSONE (DELTASONE) 20 MG tablet Take 2 tablets (40 mg total) by mouth daily with breakfast for 5 days. 09/22/17 09/27/17  Zigmund Gottron, NP    Family History Family History  Problem Relation Age of Onset  . Healthy Mother   . Healthy Father     Social History Social History   Tobacco Use  . Smoking status: Former Smoker    Packs/day: 0.00  . Smokeless tobacco: Never Used  Substance Use Topics  . Alcohol use: Not Currently    Comment: former  . Drug use: Not Currently    Types: Cocaine, Marijuana    Comment: last used cocaine 2 weeks ago     Allergies   Patient has no known allergies.   Review of Systems Review of Systems   Physical Exam Triage Vital Signs ED Triage Vitals  Enc Vitals Group     BP 09/22/17 1643 121/82     Pulse Rate 09/22/17 1643 77     Resp 09/22/17 1643 20     Temp 09/22/17 1643 97.7 F (36.5 C)     Temp Source 09/22/17 1643 Temporal     SpO2 09/22/17 1643 97 %     Weight --  Height --      Head Circumference --      Peak Flow --      Pain Score 09/22/17 1642 0     Pain Loc --      Pain Edu? --      Excl. in [place]? --    No data found.  Updated Vital Signs BP 121/82 (BP Location: Right Arm)   Pulse 77   Temp 97.7 F (36.5 C) (Temporal)   Resp 20   SpO2 97%    Physical Exam  Constitutional: He is oriented to person, place, and time. He appears well-developed and well-nourished.  HENT:  Head: Normocephalic and atraumatic.  Right Ear: Tympanic membrane and external ear normal. There is drainage.  Left Ear: Tympanic membrane, external ear and ear canal normal.  Nose: Nose normal. Right sinus exhibits no maxillary sinus tenderness and no frontal sinus tenderness. Left sinus exhibits no maxillary sinus tenderness and no frontal sinus tenderness.  Mouth/Throat: Uvula is midline, oropharynx is clear and moist  and mucous membranes are normal.  White drainage and mild redness to right ear, patient states he is using drops previously prescribed for left ear, for this   Eyes: Pupils are equal, round, and reactive to light. Conjunctivae are normal.  Neck: Normal range of motion.  Cardiovascular: Normal rate and regular rhythm.  Pulmonary/Chest: Effort normal and breath sounds normal. He has no wheezes.  Occasional congested cough noted; no wheezes noted, mildly decreased lung sounds throughout   Lymphadenopathy:    He has no cervical adenopathy.  Neurological: He is alert and oriented to person, place, and time.  Skin: Skin is warm and dry.  Vitals reviewed.    UC Treatments / Results  Labs (all labs ordered are listed, but only abnormal results are displayed) Labs Reviewed - No data to display  EKG None  Radiology Dg Chest 2 View  Result Date: 09/22/2017 CLINICAL DATA:  37 year old male with chest congestion and tightness for over a month. EXAM: CHEST - 2 VIEW COMPARISON:  Chest radiograph 08/17/2017 and earlier. FINDINGS: Mildly lower lung volumes. Mediastinal contours remain normal. Mild crowding of lung base markings. No pneumothorax, pulmonary edema, pleural effusion or other confluent pulmonary opacity. Visualized tracheal air column is within normal limits. No acute osseous abnormality identified. Negative visible bowel gas pattern. IMPRESSION: Lower lung volumes.  No acute cardiopulmonary abnormality. Electronically Signed   By: Genevie Ann M.D.   On: 09/22/2017 17:24    Procedures Procedures (including critical care time)  Medications Ordered in UC Medications  ipratropium-albuterol (DUONEB) 0.5-2.5 (3) MG/3ML nebulizer solution 3 mL (3 mLs Nebulization Given 09/22/17 1728)    Initial Impression / Assessment and Plan / UC Course  I have reviewed the triage vital signs and the nursing notes.  Pertinent labs & imaging results that were available during my care of the patient were  reviewed by me and considered in my medical decision making (see chart for details).     Chest xray reassuring. Vitals remain stable. No acute distress. No shortness of breath  Or difficulty breathing. Lungs grossly clear today. No fevers. Did improve with decadron two days ago. 5 days of prednisone provided. Encouraged regular use of inhalers. Continue with ear drops to right ear. Encouraged follow up with PCP. ROS per HPI. Patient verbalized understanding and agreeable to plan.  Ambulatory out of clinic without difficulty.    Final Clinical Impressions(s) / UC Diagnoses   Final diagnoses:  Cough  Mild intermittent asthma,  unspecified whether complicated     Discharge Instructions     Chest xray is reassuring today.  No fevers, pain or shortness of breath. Will provide 5 days of oral steroids.  Continue to use daily steroid inhaler.  I would recommend more regular use of your albuterol inhaler to help with tightness.  Please follow up with your primary care provider for repeat evaluation in the next week.     ED Prescriptions    Medication Sig Dispense Auth. Provider   predniSONE (DELTASONE) 20 MG tablet Take 2 tablets (40 mg total) by mouth daily with breakfast for 5 days. 10 tablet Zigmund Gottron, NP     Controlled Substance Prescriptions [place] Controlled Substance Registry consulted? Not Applicable   Zigmund Gottron, NP 09/22/17 1744

## 2017-09-22 NOTE — ED Triage Notes (Signed)
Pt complains of chest congestion; no other symptoms.

## 2017-09-28 ENCOUNTER — Encounter (HOSPITAL_COMMUNITY): Payer: Self-pay

## 2017-09-28 ENCOUNTER — Ambulatory Visit (HOSPITAL_COMMUNITY)
Admission: EM | Admit: 2017-09-28 | Discharge: 2017-09-28 | Disposition: A | Discharge status: Home / Self Care | Payer: Self-pay | Attending: Family Medicine | Admitting: Family Medicine

## 2017-09-28 ENCOUNTER — Other Ambulatory Visit: Payer: Self-pay

## 2017-09-28 DIAGNOSIS — R0981 Nasal congestion: Secondary | ICD-10-CM

## 2017-09-28 DIAGNOSIS — J4540 Moderate persistent asthma, uncomplicated: Secondary | ICD-10-CM

## 2017-09-28 NOTE — ED Triage Notes (Signed)
Pt presents to [facility] for possible bronchitis x2 days, pt complains of congestion. Pt has used inhaler but has no relief

## 2017-09-28 NOTE — ED Provider Notes (Signed)
[place]    CSN: [id number] Arrival date & time: 09/28/17  1508     History   Chief Complaint Chief Complaint  Patient presents with  . Bronchitis    HPI Jerome Irwin is a 37 y.o. male.   HPI  Patient has been here 8 times in the last 5 weeks for upper respiratory symptoms.  He has nasal congestion, he has had some sinus symptoms, he has had a sore throat, has had some asthma wheezing.  He has had antibiotics.  He said prednisone.  He has had inhalers.  He has Flonase.  Today he is here because he has 2 days of nasal congestion.  He wants a refill of his medications.  When asked which ones he thinks that he needs a breathing treatment and some more prednisone.  He is not complaining of any wheeze or cough.  No fever or chills.  No nausea or vomiting.  No ear pressure or pain.  No sore throat.  He does admit that his nasal congestion is worse after smoking marijuana.  Past Medical History:  Diagnosis Date  . Asthma   . Bronchitis   . Pneumonia   . Ulcerative colitis ([place])     There are no active problems to display for this patient.   Past Surgical History:  Procedure Laterality Date  . COLONOSCOPY WITH PROPOFOL N/A 02/21/2016   Procedure: COLONOSCOPY WITH PROPOFOL;  Surgeon: Arta Silence, MD;  Location: [facility];  Service: Endoscopy;  Laterality: N/A;       Home Medications    Prior to Admission medications   Medication Sig Start Date End Date Taking? Authorizing Provider  albuterol (PROVENTIL HFA;VENTOLIN HFA) 108 (90 Base) MCG/ACT inhaler Inhale 1-2 puffs into the lungs every 6 (six) hours as needed for wheezing or shortness of breath. 09/06/17  Yes Wieters, Hallie C, PA-C  cetirizine (ZYRTEC) 5 MG tablet Take 10 mg by mouth daily.   Yes [provider]  Fluticasone-Salmeterol (ADVAIR) 100-50 MCG/DOSE AEPB Inhale 1 puff into the lungs 2 (two) times daily. 09/06/17  Yes Wieters, Elesa Hacker, PA-C    Family History Family History  Problem  Relation Age of Onset  . Healthy Mother   . Healthy Father     Social History Social History   Tobacco Use  . Smoking status: Former Smoker    Packs/day: 0.00  . Smokeless tobacco: Never Used  Substance Use Topics  . Alcohol use: Not Currently    Comment: former  . Drug use: Not Currently    Types: Cocaine, Marijuana    Comment: last used cocaine 2 weeks ago     Allergies   Patient has no known allergies.   Review of Systems Review of Systems  Constitutional: Negative for chills and fever.  HENT: Positive for congestion and sinus pressure. Negative for ear pain and sore throat.   Eyes: Negative for pain and visual disturbance.  Respiratory: Negative for cough and shortness of breath.   Cardiovascular: Negative for chest pain and palpitations.  Gastrointestinal: Negative for abdominal pain and vomiting.  Genitourinary: Negative for dysuria and hematuria.  Musculoskeletal: Negative for arthralgias and back pain.  Skin: Negative for color change and rash.  Neurological: Negative for seizures and syncope.  All other systems reviewed and are negative.    Physical Exam Triage Vital Signs ED Triage Vitals  Enc Vitals Group     BP 09/28/17 1547 120/76     Pulse Rate 09/28/17 1547 75  Resp 09/28/17 1547 17     Temp 09/28/17 1547 97.8 F (36.6 C)     Temp Source 09/28/17 1547 Oral     SpO2 09/28/17 1547 96 %     Weight --      Height --      Head Circumference --      Peak Flow --      Pain Score 09/28/17 1548 0     Pain Loc --      Pain Edu? --      Excl. in [place]? --    No data found.  Updated Vital Signs BP 120/76 (BP Location: Left Arm)   Pulse 75   Temp 97.8 F (36.6 C) (Oral)   Resp 17   SpO2 96%   Visual Acuity Right Eye Distance:   Left Eye Distance:   Bilateral Distance:    Right Eye Near:   Left Eye Near:    Bilateral Near:     Physical Exam  Constitutional: He appears well-developed and well-nourished. No distress.  HENT:  Head:  Normocephalic and atraumatic.  Right Ear: External ear normal.  Left Ear: External ear normal.  Mouth/Throat: Oropharynx is clear and moist.  Right TM has moderate injection.  Oropharynx is benign.  Nasal membranes are swollen and pink  Eyes: Pupils are equal, round, and reactive to light. Conjunctivae are normal.  Neck: Normal range of motion.  Cardiovascular: Normal rate, regular rhythm and normal heart sounds.  Pulmonary/Chest: Effort normal. No respiratory distress. He has wheezes.  Scattered inspiratory wheezes.  Full inspiration  Abdominal: Soft. He exhibits no distension.  Musculoskeletal: Normal range of motion. He exhibits no edema.  Neurological: He is alert.  Skin: Skin is warm and dry.     UC Treatments / Results  Labs (all labs ordered are listed, but only abnormal results are displayed) Labs Reviewed - No data to display  EKG None  Radiology No results found.  Procedures Procedures (including critical care time)  Medications Ordered in UC Medications - No data to display  Initial Impression / Assessment and Plan / UC Course  I have reviewed the triage vital signs and the nursing notes.  Pertinent labs & imaging results that were available during my care of the patient were reviewed by me and considered in my medical decision making (see chart for details).      Final Clinical Impressions(s) / UC Diagnoses   Final diagnoses:  Nasal congestion  Moderate persistent chronic asthma without complication     Discharge Instructions     Continue your inhalers Use Flonase as directed Use your eardrops for a few more days Take prednisone as directed Return for significant worsening of condition   ED Prescriptions    None     Controlled Substance Prescriptions [place] Controlled Substance Registry consulted? Not Applicable   Raylene Everts, MD 09/28/17 2007

## 2017-09-28 NOTE — Discharge Instructions (Signed)
Continue your inhalers Use Flonase as directed Use your eardrops for a few more days Take prednisone as directed Return for significant worsening of condition

## 2017-10-04 ENCOUNTER — Emergency Department (HOSPITAL_COMMUNITY)
Admission: EM | Admit: 2017-10-04 | Discharge: 2017-10-04 | Disposition: A | Discharge status: Home / Self Care | Payer: Self-pay | Attending: Emergency Medicine | Admitting: Emergency Medicine

## 2017-10-04 ENCOUNTER — Other Ambulatory Visit: Payer: Self-pay

## 2017-10-04 ENCOUNTER — Encounter (HOSPITAL_COMMUNITY): Payer: Self-pay | Admitting: *Deleted

## 2017-10-04 DIAGNOSIS — Z79899 Other long term (current) drug therapy: Secondary | ICD-10-CM | POA: Insufficient documentation

## 2017-10-04 DIAGNOSIS — Z87891 Personal history of nicotine dependence: Secondary | ICD-10-CM | POA: Insufficient documentation

## 2017-10-04 DIAGNOSIS — R0981 Nasal congestion: Secondary | ICD-10-CM | POA: Insufficient documentation

## 2017-10-04 DIAGNOSIS — J45909 Unspecified asthma, uncomplicated: Secondary | ICD-10-CM | POA: Insufficient documentation

## 2017-10-04 MED ORDER — IPRATROPIUM-ALBUTEROL 0.5-2.5 (3) MG/3ML IN SOLN
3.0000 mL | Freq: Once | RESPIRATORY_TRACT | Status: AC
  Administered 2017-10-04: 3 mL via RESPIRATORY_TRACT
  Filled 2017-10-04: qty 3

## 2017-10-04 NOTE — ED Provider Notes (Signed)
[facility] DEPT Provider Note   CSN: [id number] Arrival date & time: 10/04/17  0402     History   Chief Complaint Chief Complaint  Patient presents with  . Nasal Congestion    HPI Jerome Irwin is a 37 y.o. male.  37 y.o male with  PMH of asthma, collitis presents to the ED with a chief complaint of congestion x 1 month. Patient has been seen at UC 8 times during the past 6 weeks for upper respiratory symptoms.He reports     Past Medical History:  Diagnosis Date  . Asthma   . Bronchitis   . Pneumonia   . Ulcerative colitis ([place])     There are no active problems to display for this patient.   Past Surgical History:  Procedure Laterality Date  . COLONOSCOPY WITH PROPOFOL N/A 02/21/2016   Procedure: COLONOSCOPY WITH PROPOFOL;  Surgeon: Arta Silence, MD;  Location: [facility];  Service: Endoscopy;  Laterality: N/A;        Home Medications    Prior to Admission medications   Medication Sig Start Date End Date Taking? Authorizing Provider  albuterol (PROVENTIL HFA;VENTOLIN HFA) 108 (90 Base) MCG/ACT inhaler Inhale 1-2 puffs into the lungs every 6 (six) hours as needed for wheezing or shortness of breath. 09/06/17  Yes Wieters, Hallie C, PA-C  cetirizine (ZYRTEC) 5 MG tablet Take 10 mg by mouth daily.   Yes [provider]  fluticasone (FLONASE) 50 MCG/ACT nasal spray Place 1 spray into both nostrils daily as needed for allergies or rhinitis.   Yes [provider]  Fluticasone-Salmeterol (ADVAIR) 100-50 MCG/DOSE AEPB Inhale 1 puff into the lungs 2 (two) times daily. 09/06/17  Yes Wieters, Elesa Hacker, PA-C    Family History Family History  Problem Relation Age of Onset  . Healthy Mother   . Healthy Father     Social History Social History   Tobacco Use  . Smoking status: Former Smoker    Packs/day: 0.00  . Smokeless tobacco: Never Used  Substance Use Topics  . Alcohol use: Not Currently    Comment: former    . Drug use: Not Currently    Types: Cocaine, Marijuana    Comment: last used cocaine 2 weeks ago     Allergies   Patient has no known allergies.   Review of Systems Review of Systems  Constitutional: Negative for chills and fever.  HENT: Positive for postnasal drip and rhinorrhea. Negative for sore throat.   All other systems reviewed and are negative.    Physical Exam Updated Vital Signs BP (!) 139/95   Pulse 75   Temp 97.8 F (36.6 C)   Resp 16   SpO2 96%   Physical Exam  Constitutional: He is oriented to person, place, and time. He appears well-developed and well-nourished.  HENT:  Head: Normocephalic and atraumatic.  Nose: Rhinorrhea present. Right sinus exhibits no maxillary sinus tenderness and no frontal sinus tenderness. Left sinus exhibits no maxillary sinus tenderness and no frontal sinus tenderness.  Mouth/Throat: Uvula is midline. No posterior oropharyngeal edema or posterior oropharyngeal erythema.  Nasal drainage present during evaluation.   Eyes: Pupils are equal, round, and reactive to light.  Neck: Normal range of motion. Neck supple.  Cardiovascular: Normal heart sounds.  Pulmonary/Chest: Effort normal and breath sounds normal.  Abdominal: Soft.  Musculoskeletal: He exhibits no tenderness or deformity.  Neurological: He is alert and oriented to person, place, and time.  Skin: Skin is warm and dry.  Nursing note and vitals reviewed.    ED Treatments / Results  Labs (all labs ordered are listed, but only abnormal results are displayed) Labs Reviewed - No data to display  EKG None  Radiology No results found.  Procedures Procedures (including critical care time)  Medications Ordered in ED Medications  ipratropium-albuterol (DUONEB) 0.5-2.5 (3) MG/3ML nebulizer solution 3 mL (3 mLs Nebulization Given 10/04/17 1224)     Initial Impression / Assessment and Plan / ED Course  I have reviewed the triage vital signs and the nursing  notes.  Pertinent labs & imaging results that were available during my care of the patient were reviewed by me and considered in my medical decision making (see chart for details).     He has been seen multiple times in the ED from September 13, 2017 up until today for URI symptoms.  She states he has used Flonase, steroids, the counter medication but states no relieving symptoms.  Patient reports he was just started on steroids and states he does not notice why his nose continues to run.  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  At this time I have advised patient that he is to follow-up with an ENT for them to further evaluate his nose.  He denies any fever, chest pain, shortness of breath.  Reports the drainage continues after a month of trying any therapy, at this time I have given him a referral for an ENT for further evaluation as this symptom has become chronic.  He denies any toxic symptoms such as fever, chills.  Final Clinical Impressions(s) / ED Diagnoses   Final diagnoses:  Nasal congestion    ED Discharge Orders    None       Janeece Fitting, PA-C 10/08/17 1449    Fatima Blank, MD 10/11/17 1506

## 2017-10-04 NOTE — Discharge Instructions (Addendum)
I have provided a referral for ENT please schedule an appointment as needed. If you experience any fever, shortness of breath or chest pain please return to the ED for reevaluation. You may follow up with your PCP in 1 week.

## 2017-10-04 NOTE — ED Triage Notes (Signed)
Pt arrives with c/o nasal and chest congestion on and off for about 1 month. He has used his inhaler. Denies fevers. Lung sounds clear, no distress.

## 2017-10-09 ENCOUNTER — Encounter (HOSPITAL_COMMUNITY): Payer: Self-pay | Admitting: Emergency Medicine

## 2017-10-09 ENCOUNTER — Ambulatory Visit (HOSPITAL_COMMUNITY)
Admission: EM | Admit: 2017-10-09 | Discharge: 2017-10-09 | Disposition: A | Discharge status: Home / Self Care | Payer: Self-pay | Attending: Family Medicine | Admitting: Family Medicine

## 2017-10-09 ENCOUNTER — Other Ambulatory Visit: Payer: Self-pay

## 2017-10-09 DIAGNOSIS — Z8719 Personal history of other diseases of the digestive system: Secondary | ICD-10-CM

## 2017-10-09 DIAGNOSIS — J329 Chronic sinusitis, unspecified: Secondary | ICD-10-CM

## 2017-10-09 NOTE — Discharge Instructions (Signed)
Push fluids to ensure adequate hydration and keep secretions thin.  Continue with daily nasal spray as provided.  Daily zyrtec to help with congestion.  Tylenol as needed for headache.  Please follow up with ENT for further evaluation of your persistent symptoms.  If develop worsening of pain, fevers, blood in stool, vomiting or diarrhea please return or go to the Er.

## 2017-10-09 NOTE — ED Provider Notes (Signed)
[facility]    CSN: [id number] Arrival date & time: 10/09/17  1622     History   Chief Complaint Chief Complaint  Patient presents with  . URI    HPI Jerome Irwin is a 37 y.o. male.   Venus presents with complaints of recurrent/ persistent nasal congestion "sinus headache" and productive cough. States started two days ago but per chart review and in further inquiry has been ongoing for at least the past 1-2 months. Has been seen multiple times for this. States cough is productive. No shortness of breath . Has nausea and abdominal cramping at times. No vomiting or diarrhea. No further ear pain. No further nasal drainage. No wheezing. Uses inhaler daily. States has been taking nyquil regularly which minimally helps. No chest pain. Hx of asthma, bronchitis, pna, UC. Was on cefdinir approximately a month ago, has been on steroids, and decongestants as well. Has not followed with his PCP or with ENT as previously recommended. No specific known fevers. At times feels cold.     ROS per HPI.      Past Medical History:  Diagnosis Date  . Asthma   . Bronchitis   . Pneumonia   . Ulcerative colitis ([place])     There are no active problems to display for this patient.   Past Surgical History:  Procedure Laterality Date  . COLONOSCOPY WITH PROPOFOL N/A 02/21/2016   Procedure: COLONOSCOPY WITH PROPOFOL;  Surgeon: Arta Silence, MD;  Location: [facility];  Service: Endoscopy;  Laterality: N/A;       Home Medications    Prior to Admission medications   Medication Sig Start Date End Date Taking? Authorizing Provider  albuterol (PROVENTIL HFA;VENTOLIN HFA) 108 (90 Base) MCG/ACT inhaler Inhale 1-2 puffs into the lungs every 6 (six) hours as needed for wheezing or shortness of breath. 09/06/17  Yes Wieters, Hallie C, PA-C  ferrous sulfate 325 (65 FE) MG EC tablet Take 325 mg by mouth 3 (three) times daily with meals.   Yes [provider]  fluticasone  (FLONASE) 50 MCG/ACT nasal spray Place 1 spray into both nostrils daily as needed for allergies or rhinitis.   Yes [provider]  Fluticasone-Salmeterol (ADVAIR) 100-50 MCG/DOSE AEPB Inhale 1 puff into the lungs 2 (two) times daily. 09/06/17  Yes Wieters, Hallie C, PA-C  sulfaSALAzine (AZULFIDINE) 500 MG tablet Take 500 mg by mouth 4 (four) times daily.   Yes [provider]  cetirizine (ZYRTEC) 5 MG tablet Take 10 mg by mouth daily.    [provider]    Family History Family History  Problem Relation Age of Onset  . Healthy Mother   . Healthy Father     Social History Social History   Tobacco Use  . Smoking status: Former Smoker    Packs/day: 0.00  . Smokeless tobacco: Never Used  Substance Use Topics  . Alcohol use: Not Currently    Comment: former  . Drug use: Not Currently    Types: Cocaine, Marijuana    Comment: last used cocaine 2 weeks ago     Allergies   Patient has no known allergies.   Review of Systems Review of Systems   Physical Exam Triage Vital Signs ED Triage Vitals  Enc Vitals Group     BP 10/09/17 1709 (!) 141/89     Pulse Rate 10/09/17 1709 (!) 101     Resp 10/09/17 1709 16     Temp 10/09/17 1709 99.1 F (37.3 C)  Temp Source 10/09/17 1709 Oral     SpO2 10/09/17 1709 100 %     Weight --      Height --      Head Circumference --      Peak Flow --      Pain Score 10/09/17 1713 6     Pain Loc --      Pain Edu? --      Excl. in [place]? --    No data found.  Updated Vital Signs BP (!) 141/89 (BP Location: Right Arm)   Pulse (!) 101   Temp 99.1 F (37.3 C) (Oral)   Resp 16   SpO2 100%   Physical Exam  Constitutional: He is oriented to person, place, and time. He appears well-developed and well-nourished.  HENT:  Head: Normocephalic and atraumatic.  Right Ear: Tympanic membrane and external ear normal.  Left Ear: Tympanic membrane, external ear and ear canal normal.  Nose: Nose normal. Right sinus  exhibits no maxillary sinus tenderness and no frontal sinus tenderness. Left sinus exhibits no maxillary sinus tenderness and no frontal sinus tenderness.  Mouth/Throat: Uvula is midline, oropharynx is clear and moist and mucous membranes are normal.  Right canal with mild redness and mild fluid noted to TM, no redness or bulging to TM noted; no pain with movement of right pinna   Eyes: Pupils are equal, round, and reactive to light. Conjunctivae are normal.  Neck: Normal range of motion.  Cardiovascular: Normal rate and regular rhythm.  Pulmonary/Chest: Effort normal and breath sounds normal. He has no wheezes.  Occasional congested productive cough noted; no wheezing, no increased work of breathing noted   Abdominal: Soft. There is no tenderness.  Lymphadenopathy:    He has no cervical adenopathy.  Neurological: He is alert and oriented to person, place, and time.  Skin: Skin is warm and dry.  Vitals reviewed.    UC Treatments / Results  Labs (all labs ordered are listed, but only abnormal results are displayed) Labs Reviewed - No data to display  EKG None  Radiology No results found.  Procedures Procedures (including critical care time)  Medications Ordered in UC Medications - No data to display  Initial Impression / Assessment and Plan / UC Course  I have reviewed the triage vital signs and the nursing notes.  Pertinent labs & imaging results that were available during my care of the patient were reviewed by me and considered in my medical decision making (see chart for details).     HR 101 at triage with temp of 99 in clinic today. Still with no acute findings on exam at this time. Has had multiple evaluations in the past month for this, with apparently no improvement of symptoms. Recommend follow up with ENT for further evaluation. Return precautions provided. Patient verbalized understanding and agreeable to plan.    Final Clinical Impressions(s) / UC Diagnoses    Final diagnoses:  Chronic sinusitis, unspecified location  History of ulcerative colitis     Discharge Instructions     Push fluids to ensure adequate hydration and keep secretions thin.  Continue with daily nasal spray as provided.  Daily zyrtec to help with congestion.  Tylenol as needed for headache.  Please follow up with ENT for further evaluation of your persistent symptoms.  If develop worsening of pain, fevers, blood in stool, vomiting or diarrhea please return or go to the Er.    ED Prescriptions    None     Controlled  Substance Prescriptions [place] Controlled Substance Registry consulted? Not Applicable   Zigmund Gottron, NP 10/09/17 1749

## 2017-10-09 NOTE — ED Triage Notes (Signed)
Chest congestion, patient has stuffiness in head and feels sinus pressure.  Patient has had symptoms for 2 days.

## 2017-10-23 ENCOUNTER — Ambulatory Visit (HOSPITAL_COMMUNITY)
Admission: EM | Admit: 2017-10-23 | Discharge: 2017-10-23 | Disposition: A | Discharge status: Home / Self Care | Payer: Self-pay | Attending: Family Medicine | Admitting: Family Medicine

## 2017-10-23 ENCOUNTER — Encounter (HOSPITAL_COMMUNITY): Payer: Self-pay | Admitting: Emergency Medicine

## 2017-10-23 ENCOUNTER — Telehealth (HOSPITAL_COMMUNITY): Payer: Self-pay

## 2017-10-23 DIAGNOSIS — R519 Headache, unspecified: Secondary | ICD-10-CM

## 2017-10-23 DIAGNOSIS — J019 Acute sinusitis, unspecified: Secondary | ICD-10-CM

## 2017-10-23 DIAGNOSIS — R51 Headache: Secondary | ICD-10-CM

## 2017-10-23 MED ORDER — AMOXICILLIN 875 MG PO TABS: 875.0000 mg | ORAL_TABLET | Freq: Two times a day (BID) | ORAL | 0 refills | Status: AC

## 2017-10-23 MED ORDER — ACETAMINOPHEN 500 MG PO TABS: 500.0000 mg | ORAL_TABLET | Freq: Four times a day (QID) | ORAL | 0 refills | Status: DC | PRN

## 2017-10-23 MED ORDER — KETOROLAC TROMETHAMINE 60 MG/2ML IM SOLN
INTRAMUSCULAR | Status: AC
  Filled 2017-10-23: qty 2

## 2017-10-23 MED ORDER — AMOXICILLIN 875 MG PO TABS: 875.0000 mg | ORAL_TABLET | Freq: Two times a day (BID) | ORAL | 0 refills | Status: DC

## 2017-10-23 MED ORDER — KETOROLAC TROMETHAMINE 60 MG/2ML IM SOLN
60.0000 mg | Freq: Once | INTRAMUSCULAR | Status: AC
  Administered 2017-10-23: 60 mg via INTRAMUSCULAR

## 2017-10-23 NOTE — Discharge Instructions (Signed)
Complete course of antibiotics.  Tylenol as needed for pain. Continue with previously prescribed antibiotics.  Please follow up with your primary care provider for recheck of your symptoms and recheck of your Bp.

## 2017-10-23 NOTE — ED Triage Notes (Signed)
PT was seen here a week ago for sinus pain. PT has developed headaches since then. PT is hypertensive today.

## 2017-10-23 NOTE — ED Provider Notes (Signed)
[facility]    CSN: [id number] Arrival date & time: 10/23/17  1243     History   Chief Complaint Chief Complaint  Patient presents with  . Headache    HPI Jerome Irwin is a 37 y.o. male.   Jerome Irwin presents with complaints of headache and sinus pressure. This has been an ongoing issue for him. Has been seen multiple times for sinus congestion/pressure over the past two months. Using nasal spray, inhalers as needed, daily zyrtec. States the headache comes and goes but comes daily. Doesn't take anything for headache. States nsaids cause too much stomach upset. No dizziness. No weakness. No sore throat or cough. Has intermittent nausea, takes famotidine for this which occasionally helps. No vomiting. Stools have been unchanged. No known fevers. Hx of UC, pna, bronchitis and asthma. Has not followed with ENT per recommendation. Has been treated with abx, steroids, inhalers, flonase in the past for similar complaints.    ROS per HPI.      Past Medical History:  Diagnosis Date  . Asthma   . Bronchitis   . Pneumonia   . Ulcerative colitis ([place])     There are no active problems to display for this patient.   Past Surgical History:  Procedure Laterality Date  . COLONOSCOPY WITH PROPOFOL N/A 02/21/2016   Procedure: COLONOSCOPY WITH PROPOFOL;  Surgeon: Arta Silence, MD;  Location: [facility];  Service: Endoscopy;  Laterality: N/A;       Home Medications    Prior to Admission medications   Medication Sig Start Date End Date Taking? Authorizing Provider  albuterol (PROVENTIL HFA;VENTOLIN HFA) 108 (90 Base) MCG/ACT inhaler Inhale 1-2 puffs into the lungs every 6 (six) hours as needed for wheezing or shortness of breath. 09/06/17  Yes Wieters, Hallie C, PA-C  cetirizine (ZYRTEC) 5 MG tablet Take 10 mg by mouth daily.   Yes [provider]  fluticasone (FLONASE) 50 MCG/ACT nasal spray Place 1 spray into both nostrils daily as needed for allergies or  rhinitis.   Yes [provider]  Fluticasone-Salmeterol (ADVAIR) 100-50 MCG/DOSE AEPB Inhale 1 puff into the lungs 2 (two) times daily. 09/06/17  Yes Wieters, Hallie C, PA-C  sulfaSALAzine (AZULFIDINE) 500 MG tablet Take 500 mg by mouth 4 (four) times daily.   Yes [provider]  acetaminophen (TYLENOL) 500 MG tablet Take 1 tablet (500 mg total) by mouth every 6 (six) hours as needed. 10/23/17   Zigmund Gottron, NP  amoxicillin (AMOXIL) 875 MG tablet Take 1 tablet (875 mg total) by mouth 2 (two) times daily for 10 days. 10/23/17 11/02/17  Augusto Gamble B, NP  ferrous sulfate 325 (65 FE) MG EC tablet Take 325 mg by mouth 3 (three) times daily with meals.    [provider]    Family History Family History  Problem Relation Age of Onset  . Healthy Mother   . Healthy Father     Social History Social History   Tobacco Use  . Smoking status: Former Smoker    Packs/day: 0.00  . Smokeless tobacco: Never Used  Substance Use Topics  . Alcohol use: Not Currently    Comment: former  . Drug use: Not Currently    Types: Cocaine, Marijuana    Comment: last used cocaine 2 weeks ago     Allergies   Patient has no known allergies.   Review of Systems Review of Systems   Physical Exam Triage Vital Signs ED Triage Vitals  Enc Vitals Group  BP 10/23/17 1309 (!) 148/104     Pulse Rate 10/23/17 1309 85     Resp 10/23/17 1309 16     Temp --      Temp src --      SpO2 10/23/17 1309 96 %     Weight 10/23/17 1311 210 lb (95.3 kg)     Height --      Head Circumference --      Peak Flow --      Pain Score 10/23/17 1310 8     Pain Loc --      Pain Edu? --      Excl. in [place]? --    No data found.  Updated Vital Signs BP (!) 148/104   Pulse 85   Resp 16   Wt 210 lb (95.3 kg)   SpO2 96%   BMI 29.29 kg/m    Physical Exam  Constitutional: He is oriented to person, place, and time. He appears well-developed and well-nourished.  HENT:  Head:  Normocephalic and atraumatic.  Right Ear: External ear and ear canal normal.  Left Ear: External ear and ear canal normal.  Nose: Right sinus exhibits frontal sinus tenderness. Right sinus exhibits no maxillary sinus tenderness. Left sinus exhibits frontal sinus tenderness. Left sinus exhibits no maxillary sinus tenderness.  Mouth/Throat: Uvula is midline, oropharynx is clear and moist and mucous membranes are normal.  Bilateral TM's with slight effusions noted   Eyes: Pupils are equal, round, and reactive to light. Conjunctivae are normal.  Neck: Normal range of motion.  Cardiovascular: Normal rate and regular rhythm.  Pulmonary/Chest: Effort normal and breath sounds normal.  Lymphadenopathy:    He has no cervical adenopathy.  Neurological: He is alert and oriented to person, place, and time. He is not disoriented. No cranial nerve deficit. GCS eye subscore is 4. GCS verbal subscore is 5. GCS motor subscore is 6.  Skin: Skin is warm and dry.  Vitals reviewed.    UC Treatments / Results  Labs (all labs ordered are listed, but only abnormal results are displayed) Labs Reviewed - No data to display  EKG None  Radiology No results found.  Procedures Procedures (including critical care time)  Medications Ordered in UC Medications  ketorolac (TORADOL) injection 60 mg (has no administration in time range)    Initial Impression / Assessment and Plan / UC Course  I have reviewed the triage vital signs and the nursing notes.  Pertinent labs & imaging results that were available during my care of the patient were reviewed by me and considered in my medical decision making (see chart for details).     Non toxic in appearance. Acute on chronic sinusitis? Patient has been in UC or ER every 1-2 weeks for the past two months with similar complaints. Sounds like did not complete antibiotics in the past as caused too many gi symptoms. Course of amoxicillin provided. toradol provided for  headache here today. Encouraged use of tylenol as needed. Noted elevation in BP here today which did improve during stay. Encouraged recheck with PCP and ENT. Patient verbalized understanding and agreeable to plan.   Final Clinical Impressions(s) / UC Diagnoses   Final diagnoses:  Acute nonintractable headache, unspecified headache type  Subacute sinusitis, unspecified location     Discharge Instructions     Complete course of antibiotics.  Tylenol as needed for pain. Continue with previously prescribed antibiotics.  Please follow up with your primary care provider for recheck of your symptoms and  recheck of your Bp.      ED Prescriptions    Medication Sig Dispense Auth. Provider   amoxicillin (AMOXIL) 875 MG tablet Take 1 tablet (875 mg total) by mouth 2 (two) times daily for 10 days. 20 tablet Augusto Gamble B, NP   acetaminophen (TYLENOL) 500 MG tablet Take 1 tablet (500 mg total) by mouth every 6 (six) hours as needed. 30 tablet Zigmund Gottron, NP     Controlled Substance Prescriptions [place] Controlled Substance Registry consulted? Not Applicable   Zigmund Gottron, NP 10/23/17 1350

## 2017-11-02 ENCOUNTER — Emergency Department (HOSPITAL_COMMUNITY)
Admission: EM | Admit: 2017-11-02 | Discharge: 2017-11-02 | Disposition: A | Discharge status: Home / Self Care | Payer: Self-pay | Attending: Emergency Medicine | Admitting: Emergency Medicine

## 2017-11-02 ENCOUNTER — Encounter (HOSPITAL_COMMUNITY): Payer: Self-pay

## 2017-11-02 ENCOUNTER — Other Ambulatory Visit: Payer: Self-pay

## 2017-11-02 ENCOUNTER — Emergency Department (HOSPITAL_COMMUNITY): Payer: Self-pay

## 2017-11-02 DIAGNOSIS — Z87891 Personal history of nicotine dependence: Secondary | ICD-10-CM | POA: Insufficient documentation

## 2017-11-02 DIAGNOSIS — J4521 Mild intermittent asthma with (acute) exacerbation: Secondary | ICD-10-CM | POA: Insufficient documentation

## 2017-11-02 DIAGNOSIS — Z79899 Other long term (current) drug therapy: Secondary | ICD-10-CM | POA: Insufficient documentation

## 2017-11-02 DIAGNOSIS — R0789 Other chest pain: Secondary | ICD-10-CM | POA: Insufficient documentation

## 2017-11-02 DIAGNOSIS — F1490 Cocaine use, unspecified, uncomplicated: Secondary | ICD-10-CM | POA: Insufficient documentation

## 2017-11-02 LAB — BASIC METABOLIC PANEL
ANION GAP: 8 (ref 5–15)
BUN: 9 mg/dL (ref 6–20)
CHLORIDE: 106 mmol/L (ref 98–111)
CO2: 31 mmol/L (ref 22–32)
CREATININE: 1.03 mg/dL (ref 0.61–1.24)
Calcium: 9.9 mg/dL (ref 8.9–10.3)
GFR calc non Af Amer: >60 mL/min (ref >60)
Glucose, Bld: 107 mg/dL — ABNORMAL HIGH (ref 70–99)
Potassium: 3.6 mmol/L (ref 3.5–5.1)
SODIUM: 145 mmol/L (ref 135–145)

## 2017-11-02 LAB — CBC WITH DIFFERENTIAL/PLATELET
BASOS PCT: 2 %
Basophils Absolute: 0.1 10*3/uL (ref 0.0–0.1)
EOS ABS: 0.3 10*3/uL (ref 0.0–0.7)
Eosinophils Relative: 6 %
HCT: 40.2 % (ref 39.0–52.0)
HEMOGLOBIN: 13.2 g/dL (ref 13.0–17.0)
Lymphocytes Relative: 44 %
Lymphs Abs: 2.5 10*3/uL (ref 0.7–4.0)
MCH: 27.8 pg (ref 26.0–34.0)
MCHC: 32.8 g/dL (ref 30.0–36.0)
MCV: 84.8 fL (ref 78.0–100.0)
Monocytes Absolute: 0.5 10*3/uL (ref 0.1–1.0)
Monocytes Relative: 9 %
NEUTROS ABS: 2.2 10*3/uL (ref 1.7–7.7)
NEUTROS PCT: 39 %
Platelets: 329 10*3/uL (ref 150–400)
RBC: 4.74 MIL/uL (ref 4.22–5.81)
RDW: 14.8 % (ref 11.5–15.5)
WBC: 5.5 10*3/uL (ref 4.0–10.5)

## 2017-11-02 LAB — POCT I-STAT TROPONIN I: TROPONIN I, POC: 0 ng/mL (ref 0.00–0.08)

## 2017-11-02 LAB — D-DIMER, QUANTITATIVE (NOT AT ARMC): D DIMER QUANT: 0.34 ug{FEU}/mL (ref 0.00–0.50)

## 2017-11-02 NOTE — ED Triage Notes (Addendum)
Pt recently dx with bronchitis. Pt states that he has been feeling short of breath. Pt also reports his heart feels like it has been racing. Pt has been utilizing the inhaler provided to him. Pt speaking in full sentences in triage in NAD

## 2017-11-02 NOTE — ED Provider Notes (Signed)
[facility] DEPT Provider Note   CSN: [id number] Arrival date & time: 11/02/17  1038     History   Chief Complaint Chief Complaint  Patient presents with  . Shortness of Breath    HPI Jerome Irwin is a 37 y.o. male.  Patient is a 37 year old male with a history of ulcerative colitis, bronchitis and questionable asthma who presents with shortness of breath.  He states about 2 weeks ago he was having some increased sinus pressure and frontal headache and was started on antibiotics.  He is on day 10 of the antibiotics.  He has had similar symptoms in the past.  He presents today because he has some wheezing and shortness of breath.  He states previously he was coughing up some greenish type sputum but denies any significant cough currently.  He states he feels tight across his chest like when he is wheezing but denies any other specific chest pain.  No leg pain or swelling.  No fevers.  No pleuritic type chest pain.  He is a non-smoker although he used to smoke.  No recent immobilization.  He has been using his inhaler with some improvement in symptoms.  He states he got worse this morning but now feels more back to baseline.  It is questionable whether he has an actual history of asthma.  He states he is using inhaler for a while due to a diagnosis of what he says is bronchitis although he has a history of asthma noted on his chart so it is unclear whether this was wheezing associated with a prior URI or actual asthma.  He does say that periodically he has wheezing and uses an inhaler even without URI symptoms.     Past Medical History:  Diagnosis Date  . Asthma   . Bronchitis   . Pneumonia   . Ulcerative colitis ([place])     There are no active problems to display for this patient.   Past Surgical History:  Procedure Laterality Date  . COLONOSCOPY WITH PROPOFOL N/A 02/21/2016   Procedure: COLONOSCOPY WITH PROPOFOL;  Surgeon: Arta Silence, MD;   Location: [facility];  Service: Endoscopy;  Laterality: N/A;        Home Medications    Prior to Admission medications   Medication Sig Start Date End Date Taking? Authorizing Provider  albuterol (PROVENTIL HFA;VENTOLIN HFA) 108 (90 Base) MCG/ACT inhaler Inhale 1-2 puffs into the lungs every 6 (six) hours as needed for wheezing or shortness of breath. 09/06/17  Yes Wieters, Hallie C, PA-C  amoxicillin (AMOXIL) 875 MG tablet Take 1 tablet (875 mg total) by mouth 2 (two) times daily for 10 days. 10/23/17 11/02/17 Yes Raylene Everts, MD  cetirizine (ZYRTEC) 5 MG tablet Take 10 mg by mouth daily.   Yes [provider]  ferrous sulfate 325 (65 FE) MG EC tablet Take 325 mg by mouth 3 (three) times daily with meals.   Yes [provider]  fluticasone (FLONASE) 50 MCG/ACT nasal spray Place 1 spray into both nostrils daily as needed for allergies or rhinitis.   Yes [provider]  Fluticasone-Salmeterol (ADVAIR) 100-50 MCG/DOSE AEPB Inhale 1 puff into the lungs 2 (two) times daily. 09/06/17  Yes Wieters, Hallie C, PA-C  Multiple Vitamin (MULTIVITAMIN) tablet Take 1 tablet by mouth daily.   Yes [provider]  sulfaSALAzine (AZULFIDINE) 500 MG tablet Take 1,000 mg by mouth 3 (three) times daily.    Yes [provider]  acetaminophen (TYLENOL) 500  MG tablet Take 1 tablet (500 mg total) by mouth every 6 (six) hours as needed. Patient not taking: Reported on 11/02/2017 10/23/17   Raylene Everts, MD    Family History Family History  Problem Relation Age of Onset  . Healthy Mother   . Healthy Father     Social History Social History   Tobacco Use  . Smoking status: Former Smoker    Packs/day: 0.00  . Smokeless tobacco: Never Used  Substance Use Topics  . Alcohol use: Not Currently    Comment: former  . Drug use: Not Currently    Types: Cocaine, Marijuana    Comment: last used cocaine 2 weeks ago     Allergies   Patient has no known  allergies.   Review of Systems Review of Systems  Constitutional: Negative for chills, diaphoresis, fatigue and fever.  HENT: Negative for congestion, rhinorrhea and sneezing.   Eyes: Negative.   Respiratory: Positive for cough, chest tightness and shortness of breath.   Cardiovascular: Negative for chest pain and leg swelling.  Gastrointestinal: Negative for abdominal pain, blood in stool, diarrhea, nausea and vomiting.  Genitourinary: Negative for difficulty urinating, flank pain, frequency and hematuria.  Musculoskeletal: Negative for arthralgias and back pain.  Skin: Negative for rash.  Neurological: Negative for dizziness, speech difficulty, weakness, numbness and headaches.     Physical Exam Updated Vital Signs BP 128/74 (BP Location: Left Arm)   Pulse 94   Temp 98 F (36.7 C) (Oral)   Resp 16   Wt 95.3 kg   SpO2 96%   BMI 29.29 kg/m   Physical Exam  Constitutional: He is oriented to person, place, and time. He appears well-developed and well-nourished.  HENT:  Head: Normocephalic and atraumatic.  Eyes: Pupils are equal, round, and reactive to light.  Neck: Normal range of motion. Neck supple.  Cardiovascular: Normal rate, regular rhythm and normal heart sounds.  Pulmonary/Chest: Effort normal and breath sounds normal. No respiratory distress. He has no wheezes. He has no rales. He exhibits no tenderness.  Abdominal: Soft. Bowel sounds are normal. There is no tenderness. There is no rebound and no guarding.  Musculoskeletal: Normal range of motion. He exhibits no edema.       Right lower leg: He exhibits no tenderness and no edema.       Left lower leg: He exhibits no tenderness and no edema.  Lymphadenopathy:    He has no cervical adenopathy.  Neurological: He is alert and oriented to person, place, and time.  Skin: Skin is warm and dry. No rash noted.  Psychiatric: He has a normal mood and affect.     ED Treatments / Results  Labs (all labs ordered are  listed, but only abnormal results are displayed) Labs Reviewed  BASIC METABOLIC PANEL - Abnormal; Notable for the following components:      Result Value   Glucose, Bld 107 (*)    All other components within normal limits  CBC WITH DIFFERENTIAL/PLATELET  D-DIMER, QUANTITATIVE (NOT AT [facility])  I-STAT TROPONIN, ED  POCT I-STAT TROPONIN I    EKG None  Radiology Dg Chest 2 View  Result Date: 11/02/2017 CLINICAL DATA:  Shortness of breath EXAM: CHEST - 2 VIEW COMPARISON:  09/22/2017 FINDINGS: Heart and mediastinal contours are within normal limits. No focal opacities or effusions. No acute bony abnormality. IMPRESSION: No active cardiopulmonary disease. Electronically Signed   By: Rolm Baptise M.D.   On: 11/02/2017 11:08    Procedures Procedures (including  critical care time)  Medications Ordered in ED Medications - No data to display   Initial Impression / Assessment and Plan / ED Course  I have reviewed the triage vital signs and the nursing notes.  Pertinent labs & imaging results that were available during my care of the patient were reviewed by me and considered in my medical decision making (see chart for details).     Patient presents with wheezing and shortness of breath.  He feels pretty much back to baseline now.  His lungs are clear without wheezing or other abnormal lung sounds.  He has no hypoxia.  No tachycardia.  His d-dimer is normal and he has no other suggestions of PE.  His EKG does not show any ischemic changes.  His troponin is negative.  He does not have other symptoms that would be more concerning for ACS.  I feel that this is a result of some mild asthma.  His chest x-ray does not show any evidence of pneumonia or pneumothorax.  He was able to ambulate without hypoxia or significant tachycardia.  He was discharged home in good condition.  He was strongly encouraged to follow-up with his PCP.  Return precautions are given.  Final Clinical Impressions(s) / ED  Diagnoses   Final diagnoses:  Mild intermittent asthma with exacerbation    ED Discharge Orders    None       Malvin Johns, MD 11/02/17 1238

## 2017-11-02 NOTE — ED Notes (Signed)
SPO2 while ambulating 94-97%, with average heart rate of 95. He is in no distress and is relieved to hear that his tests are negative.

## 2017-11-08 ENCOUNTER — Encounter (HOSPITAL_COMMUNITY): Payer: Self-pay | Admitting: Emergency Medicine

## 2017-11-08 ENCOUNTER — Ambulatory Visit (HOSPITAL_COMMUNITY)
Admission: EM | Admit: 2017-11-08 | Discharge: 2017-11-08 | Disposition: A | Discharge status: Home / Self Care | Payer: Self-pay | Attending: Family Medicine | Admitting: Family Medicine

## 2017-11-08 DIAGNOSIS — R0602 Shortness of breath: Secondary | ICD-10-CM

## 2017-11-08 DIAGNOSIS — J4521 Mild intermittent asthma with (acute) exacerbation: Secondary | ICD-10-CM

## 2017-11-08 MED ORDER — IPRATROPIUM-ALBUTEROL 0.5-2.5 (3) MG/3ML IN SOLN
RESPIRATORY_TRACT | Status: AC
  Filled 2017-11-08: qty 3

## 2017-11-08 MED ORDER — IPRATROPIUM-ALBUTEROL 0.5-2.5 (3) MG/3ML IN SOLN
3.0000 mL | Freq: Once | RESPIRATORY_TRACT | Status: AC
  Administered 2017-11-08: 3 mL via RESPIRATORY_TRACT

## 2017-11-08 MED ORDER — PREDNISONE 50 MG PO TABS: 50.0000 mg | ORAL_TABLET | Freq: Every day | ORAL | 0 refills | Status: DC

## 2017-11-08 NOTE — ED Provider Notes (Signed)
[facility]    CSN: [id number] Arrival date & time: 11/08/17  1455     History   Chief Complaint Chief Complaint  Patient presents with  . Shortness of Breath    HPI Davyn Morandi is a 37 y.o. male.   37 year old male with history of UC, asthma, bronchitis comes in for 2 day history of shortness of breath. Patient was in the ED 6 days ago for shortness of breath which resolved prior to current symptom onset. States was exposed to second hand smoke and wonders if that could cause the symptoms. He has some nasal congestion without rhinorrhea, cough, sore throat. Denies fever, chills, night sweats. Denies chest pain. States used albuterol inhaler with mild relief. Former tobacco smoker, current some day THC smoker. Denies other illicit drug use.       Past Medical History:  Diagnosis Date  . Asthma   . Bronchitis   . Pneumonia   . Ulcerative colitis ([place])     There are no active problems to display for this patient.   Past Surgical History:  Procedure Laterality Date  . COLONOSCOPY WITH PROPOFOL N/A 02/21/2016   Procedure: COLONOSCOPY WITH PROPOFOL;  Surgeon: Arta Silence, MD;  Location: [facility];  Service: Endoscopy;  Laterality: N/A;       Home Medications    Prior to Admission medications   Medication Sig Start Date End Date Taking? Authorizing Provider  acetaminophen (TYLENOL) 500 MG tablet Take 1 tablet (500 mg total) by mouth every 6 (six) hours as needed. Patient not taking: Reported on 11/02/2017 10/23/17   Raylene Everts, MD  albuterol (PROVENTIL HFA;VENTOLIN HFA) 108 (90 Base) MCG/ACT inhaler Inhale 1-2 puffs into the lungs every 6 (six) hours as needed for wheezing or shortness of breath. 09/06/17   Wieters, Hallie C, PA-C  cetirizine (ZYRTEC) 5 MG tablet Take 10 mg by mouth daily.    [provider]  ferrous sulfate 325 (65 FE) MG EC tablet Take 325 mg by mouth 3 (three) times daily with meals.    [provider]    fluticasone (FLONASE) 50 MCG/ACT nasal spray Place 1 spray into both nostrils daily as needed for allergies or rhinitis.    [provider]  Fluticasone-Salmeterol (ADVAIR) 100-50 MCG/DOSE AEPB Inhale 1 puff into the lungs 2 (two) times daily. 09/06/17   Wieters, Hallie C, PA-C  Multiple Vitamin (MULTIVITAMIN) tablet Take 1 tablet by mouth daily.    [provider]  predniSONE (DELTASONE) 50 MG tablet Take 1 tablet (50 mg total) by mouth daily. 11/08/17   Tasia Catchings, Amy V, PA-C  sulfaSALAzine (AZULFIDINE) 500 MG tablet Take 1,000 mg by mouth 3 (three) times daily.     [provider]    Family History Family History  Problem Relation Age of Onset  . Healthy Mother   . Healthy Father     Social History Social History   Tobacco Use  . Smoking status: Former Smoker    Packs/day: 0.00  . Smokeless tobacco: Never Used  Substance Use Topics  . Alcohol use: Not Currently    Comment: former  . Drug use: Not Currently    Types: Cocaine, Marijuana    Comment: last used cocaine 2 weeks ago     Allergies   Patient has no known allergies.   Review of Systems Review of Systems  Reason unable to perform ROS: See HPI as above.     Physical Exam Triage Vital Signs ED Triage Vitals  Enc Vitals Group     BP 11/08/17 1520 129/89     Pulse Rate 11/08/17 1520 78     Resp 11/08/17 1520 18     Temp 11/08/17 1520 97.9 F (36.6 C)     Temp src --      SpO2 11/08/17 1520 94 %     Weight --      Height --      Head Circumference --      Peak Flow --      Pain Score 11/08/17 1521 0     Pain Loc --      Pain Edu? --      Excl. in [place]? --    No data found.  Updated Vital Signs BP 129/89   Pulse 78   Temp 97.9 F (36.6 C)   Resp 18   SpO2 95%   Physical Exam  Constitutional: He is oriented to person, place, and time. He appears well-developed and well-nourished. No distress.  HENT:  Head: Normocephalic and atraumatic.  Right Ear: Tympanic membrane,  external ear and ear canal normal. Tympanic membrane is not erythematous and not bulging.  Left Ear: Tympanic membrane, external ear and ear canal normal. Tympanic membrane is not erythematous and not bulging.  Nose: Nose normal. Right sinus exhibits no maxillary sinus tenderness and no frontal sinus tenderness. Left sinus exhibits no maxillary sinus tenderness and no frontal sinus tenderness.  Mouth/Throat: Uvula is midline, oropharynx is clear and moist and mucous membranes are normal.  Eyes: Pupils are equal, round, and reactive to light. Conjunctivae are normal.  Neck: Normal range of motion. Neck supple.  Cardiovascular: Normal rate, regular rhythm and normal heart sounds. Exam reveals no gallop and no friction rub.  No murmur heard. Pulmonary/Chest: Effort normal. No accessory muscle usage. No respiratory distress.  Mild inspiratory and expiratory wheezing to the right upper field. No rales, rhonchi heard.   Lymphadenopathy:    He has no cervical adenopathy.  Neurological: He is alert and oriented to person, place, and time.  Skin: Skin is warm and dry.  Psychiatric: He has a normal mood and affect. His behavior is normal. Judgment normal.     UC Treatments / Results  Labs (all labs ordered are listed, but only abnormal results are displayed) Labs Reviewed - No data to display  EKG None  Radiology No results found.  Procedures Procedures (including critical care time)  Medications Ordered in UC Medications  ipratropium-albuterol (DUONEB) 0.5-2.5 (3) MG/3ML nebulizer solution 3 mL (3 mLs Nebulization Given 11/08/17 1543)    Initial Impression / Assessment and Plan / UC Course  I have reviewed the triage vital signs and the nursing notes.  Pertinent labs & imaging results that were available during my care of the patient were reviewed by me and considered in my medical decision making (see chart for details).    Patient with resolved symptoms after duoneb. Lungs clear  to auscultation bilaterally without adventitious lung sounds. Will provide prednisone course. Albuterol as needed. Smoking cessation encouraged. Also discussed avoiding second hand smoking as triggers for asthma exacerbation. Patient to follow up with PCP for management of asthma control. Return precautions given. Patient expresses understanding and agrees to plan.  Final Clinical Impressions(s) / UC Diagnoses   Final diagnoses:  Mild intermittent asthma with exacerbation    ED Prescriptions    Medication Sig Dispense Auth. Provider   predniSONE (DELTASONE) 50 MG tablet Take 1 tablet (50 mg total) by mouth daily. 5  tablet Tobin Chad, PA-C 11/08/17 [phone]

## 2017-11-08 NOTE — Discharge Instructions (Signed)
Start prednisone as directed.  Continue albuterol inhaler as needed for shortness of breath or wheezing.  As discussed, avoid smoking, avoid secondhand smoking as this could cause exacerbation of her symptoms.  Please follow-up with your PCP for further evaluation and management of your asthma.

## 2017-11-08 NOTE — ED Triage Notes (Signed)
Pt c/o hx of bronchitis, states hes felt a little short of breath the last two days. Pt resp are equal and unlabored, speaking in full sentences. Pt is using his inhaler.

## 2018-08-30 ENCOUNTER — Other Ambulatory Visit: Payer: Self-pay

## 2018-08-30 ENCOUNTER — Encounter (HOSPITAL_COMMUNITY): Payer: Self-pay

## 2018-08-30 ENCOUNTER — Emergency Department (HOSPITAL_COMMUNITY): Payer: Self-pay

## 2018-08-30 ENCOUNTER — Emergency Department (HOSPITAL_COMMUNITY)
Admission: EM | Admit: 2018-08-30 | Discharge: 2018-08-30 | Disposition: A | Discharge status: Home / Self Care | Payer: Self-pay | Attending: Emergency Medicine | Admitting: Emergency Medicine

## 2018-08-30 DIAGNOSIS — Z20828 Contact with and (suspected) exposure to other viral communicable diseases: Secondary | ICD-10-CM | POA: Insufficient documentation

## 2018-08-30 DIAGNOSIS — J4521 Mild intermittent asthma with (acute) exacerbation: Secondary | ICD-10-CM | POA: Insufficient documentation

## 2018-08-30 DIAGNOSIS — J069 Acute upper respiratory infection, unspecified: Secondary | ICD-10-CM | POA: Insufficient documentation

## 2018-08-30 DIAGNOSIS — Z87891 Personal history of nicotine dependence: Secondary | ICD-10-CM | POA: Insufficient documentation

## 2018-08-30 DIAGNOSIS — Z79899 Other long term (current) drug therapy: Secondary | ICD-10-CM | POA: Insufficient documentation

## 2018-08-30 MED ORDER — CETIRIZINE HCL 5 MG PO TABS: 10.0000 mg | ORAL_TABLET | Freq: Every day | ORAL | 0 refills | Status: DC

## 2018-08-30 MED ORDER — FLUTICASONE PROPIONATE 50 MCG/ACT NA SUSP: 1.0000 | Freq: Every day | NASAL | 2 refills | Status: DC | PRN

## 2018-08-30 MED ORDER — AEROCHAMBER PLUS FLO-VU MEDIUM MISC
1.0000 | Freq: Once | Status: AC
  Administered 2018-08-30: 10:00:00 1
  Filled 2018-08-30: qty 1

## 2018-08-30 MED ORDER — ALBUTEROL SULFATE HFA 108 (90 BASE) MCG/ACT IN AERS
10.0000 | INHALATION_SPRAY | Freq: Once | RESPIRATORY_TRACT | Status: AC
  Administered 2018-08-30: 10:00:00 10 via RESPIRATORY_TRACT
  Filled 2018-08-30: qty 6.7

## 2018-08-30 NOTE — ED Triage Notes (Signed)
Pt states he was dx with URI/brnochitis 2-3 years ago. Pt states he has been having mild wheezing x several days. Pt states no cough or fever at home. Pt speaking in full sentences, NAD.

## 2018-08-30 NOTE — Discharge Instructions (Signed)
Please read and follow all provided instructions.  Your diagnoses today include:  1. Upper respiratory tract infection, unspecified type   2. Mild intermittent asthma with acute exacerbation     Tests performed today include: TESTS Vital signs. See below for your results today.   Medications prescribed:   Take any prescribed medications only as directed.  Home care instructions:  Follow any educational materials contained in this packet.  Follow-up instructions: Please follow-up with your primary care provider in the next 3 days for further evaluation of your symptoms and management of your asthma.  Return instructions:  Please return to the Emergency Department if you experience worsening symptoms. Please return with worsening wheezing, shortness of breath, or difficulty breathing. Return with persistent fever above 101F.  Please return if you have any other emergent concerns.  Additional Information:  Your vital signs today were: BP 124/78 (BP Location: Left Arm)    Pulse 86    Temp 98.4 F (36.9 C) (Oral)    Resp 16    Ht 5' 10"  (1.778 m)    Wt 90.7 kg    SpO2 94%    BMI 28.70 kg/m  If your blood pressure (BP) was elevated above 135/85 this visit, please have this repeated by your doctor within one month. --------------

## 2018-08-30 NOTE — ED Provider Notes (Signed)
[facility] DEPT Provider Note   CSN: [id number] Arrival date & time: 08/30/18  6387     History   Chief Complaint Chief Complaint  Patient presents with   Wheezing    HPI Jerome Irwin is a 38 y.o. male.     HPI   Patient is a 38 year old male with past medical history of asthma, bronchitis, ulcerative colitis presenting for "wheezing".  Patient reports that Jerome Irwin had some nasal congestion and rhinorrhea that began yesterday and woke up this morning with sensation of wheezing.  Feels slightly short of breath.  Jerome Irwin reports Jerome Irwin has had these symptoms several times before when Jerome Irwin has had asthma exacerbations.  Jerome Irwin denies chest pain.  Denies productive cough.  Jerome Irwin was recently incarcerated and was released 2 weeks ago but denies any known COVID-19 exposures.  Jerome Irwin has not been tested since leaving jail.  Jerome Irwin denies any recent immobilization, hospitalization, hormone use, cancer treatment, history DVT/PE, recent surgery.  Jerome Irwin has been taking his albuterol inhaler at home.  Past Medical History:  Diagnosis Date   Asthma    Bronchitis    Pneumonia    Ulcerative colitis ([place])     There are no active problems to display for this patient.   Past Surgical History:  Procedure Laterality Date   COLONOSCOPY WITH PROPOFOL N/A 02/21/2016   Procedure: COLONOSCOPY WITH PROPOFOL;  Surgeon: Arta Silence, MD;  Location: [facility];  Service: Endoscopy;  Laterality: N/A;        Home Medications    Prior to Admission medications   Medication Sig Start Date End Date Taking? Authorizing Provider  acetaminophen (TYLENOL) 500 MG tablet Take 1 tablet (500 mg total) by mouth every 6 (six) hours as needed. Patient not taking: Reported on 11/02/2017 10/23/17   Raylene Everts, MD  albuterol (PROVENTIL HFA;VENTOLIN HFA) 108 (90 Base) MCG/ACT inhaler Inhale 1-2 puffs into the lungs every 6 (six) hours as needed for wheezing or shortness of breath. 09/06/17   Wieters,  Hallie C, PA-C  cetirizine (ZYRTEC) 5 MG tablet Take 10 mg by mouth daily.    [provider]  ferrous sulfate 325 (65 FE) MG EC tablet Take 325 mg by mouth 3 (three) times daily with meals.    [provider]  fluticasone (FLONASE) 50 MCG/ACT nasal spray Place 1 spray into both nostrils daily as needed for allergies or rhinitis.    [provider]  Fluticasone-Salmeterol (ADVAIR) 100-50 MCG/DOSE AEPB Inhale 1 puff into the lungs 2 (two) times daily. 09/06/17   Wieters, Hallie C, PA-C  Multiple Vitamin (MULTIVITAMIN) tablet Take 1 tablet by mouth daily.    [provider]  predniSONE (DELTASONE) 50 MG tablet Take 1 tablet (50 mg total) by mouth daily. 11/08/17   Tasia Catchings, Amy V, PA-C  sulfaSALAzine (AZULFIDINE) 500 MG tablet Take 1,000 mg by mouth 3 (three) times daily.     [provider]    Family History Family History  Problem Relation Age of Onset   Healthy Mother    Healthy Father     Social History Social History   Tobacco Use   Smoking status: Former Smoker    Packs/day: 0.00   Smokeless tobacco: Never Used  Substance Use Topics   Alcohol use: Not Currently    Comment: former   Drug use: Not Currently    Types: Cocaine, Marijuana    Comment: last used cocaine 2 weeks ago     Allergies   Patient has no  known allergies.   Review of Systems Review of Systems  Constitutional: Negative for chills and fever.  HENT: Positive for congestion and rhinorrhea. Negative for sore throat.   Respiratory: Positive for wheezing. Negative for cough and shortness of breath.   Cardiovascular: Negative for chest pain and leg swelling.  Gastrointestinal: Negative for nausea and vomiting.     Physical Exam Updated Vital Signs BP 124/78 (BP Location: Left Arm)    Pulse 86    Temp 98.4 F (36.9 C) (Oral)    Resp 16    Ht 5' 10"  (1.778 m)    Wt 90.7 kg    SpO2 94%    BMI 28.70 kg/m   Physical Exam Vitals signs and nursing note reviewed.    Constitutional:      General: Jerome Irwin is not in acute distress.    Appearance: Jerome Irwin is well-developed. Jerome Irwin is not diaphoretic.     Comments: Sitting comfortably in bed.  HENT:     Head: Normocephalic and atraumatic.     Mouth/Throat:     Mouth: Mucous membranes are moist.     Pharynx: No oropharyngeal exudate or posterior oropharyngeal erythema.  Eyes:     General:        Right eye: No discharge.        Left eye: No discharge.     Conjunctiva/sclera: Conjunctivae normal.     Comments: EOMs normal to gross examination.  Neck:     Musculoskeletal: Normal range of motion.  Cardiovascular:     Rate and Rhythm: Normal rate and regular rhythm.     Pulses: Normal pulses.     Heart sounds: Normal heart sounds.  Pulmonary:     Effort: Pulmonary effort is normal.     Breath sounds: Normal breath sounds. No wheezing or rales.     Comments: Speaking in full sentences.  No respiratory distress. Abdominal:     General: There is no distension.     Tenderness: There is no abdominal tenderness.  Musculoskeletal: Normal range of motion.  Skin:    General: Skin is warm and dry.  Neurological:     Mental Status: Jerome Irwin is alert.     Comments: Cranial nerves intact to gross observation. Patient moves extremities without difficulty.  Psychiatric:        Behavior: Behavior normal.        Thought Content: Thought content normal.        Judgment: Judgment normal.      ED Treatments / Results  Labs (all labs ordered are listed, but only abnormal results are displayed) Labs Reviewed  NOVEL CORONAVIRUS, NAA (HOSPITAL ORDER, SEND-OUT TO REF LAB)    EKG EKG Interpretation  Date/Time:  Sunday August 30 2018 09:49:42 EDT Ventricular Rate:  71 PR Interval:    QRS Duration: 87 QT Interval:  376 QTC Calculation: 409 R Axis:   24 Text Interpretation:  Sinus rhythm No significant change since last tracing Confirmed by Dorie Rank [phone]) on 08/30/2018 9:57:17 AM   Radiology Dg Chest 2 View  Result  Date: 08/30/2018 CLINICAL DATA:  Mild congestion for 2 days EXAM: CHEST - 2 VIEW COMPARISON:  November 02, 2017 FINDINGS: The heart size and mediastinal contours are within normal limits. Both lungs are clear. The visualized skeletal structures are unremarkable. IMPRESSION: No active cardiopulmonary disease. Electronically Signed   By: Abelardo Diesel M.D.   On: 08/30/2018 08:21    Procedures Procedures (including critical care time)  Medications Ordered in ED Medications  albuterol (VENTOLIN HFA) 108 (90 Base) MCG/ACT inhaler 10 puff (10 puffs Inhalation Given 08/30/18 1005)  AeroChamber Plus Flo-Vu Medium MISC 1 each (1 each Other Given 08/30/18 1006)     Initial Impression / Assessment and Plan / ED Course  I have reviewed the triage vital signs and the nursing notes.  Pertinent labs & imaging results that were available during my care of the patient were reviewed by me and considered in my medical decision making (see chart for details).  Clinical Course as of Aug 29 1053  Sun Aug 30, 2018  1054 Ambulated pt in room and Jerome Irwin is >98%.  SpO2: 94 % [AM]    Clinical Course User Index [AM] Albesa Seen, PA-C       This is a well-appearing 38 year old male with past medical history of asthma, bronchitis presenting for reported wheezing at home.  Jerome Irwin reports new onset congestion and rhinorrhea as well.  Do not suspect pulmonary embolism as patient is cleared by [facility] and PERC criteria.  Jerome Irwin is not hypoxic or tachycardic.  Jerome Irwin was ambulated in the room by me and pulse ox was greater than 98%.  Patient reports similar symptom when Jerome Irwin feels Jerome Irwin is having asthma exacerbation upper respiratory infection.  Jerome Irwin has clear lung exam.  EKG obtained to assess for any changes suggestive of cardiac cause of symptoms, and his EKG is normal and unchanged without evidence of acute ischemia, infarction, or arrhythmia.  Chest x-ray, reviewed by me without cardiopulmonary disease.  Patient was given HFA equivalent of  nebulized albuterol treatment.  Breathing comfortably, with normal lung exam.  Patient prescribed cetirizine and Flonase in addition to as needed albuterol inhaler.  Jerome Irwin is instructed to follow-up with primary care.  Return precautions given for any shortness of breath, increased wheezing or new or worsening symptoms.  Patient is in understanding and agrees with the plan of care.  Patient was also tested for COVID-19.  Jerome Irwin is instructed to self quarantine at home to his results back.Jerome Irwin was evaluated in Emergency Department on 08/30/2018 for the symptoms described in the history of present illness. Jerome Irwin was evaluated in the context of the global COVID-19 pandemic, which necessitated consideration that the patient might be at risk for infection with the SARS-CoV-2 virus that causes COVID-19. Institutional protocols and algorithms that pertain to the evaluation of patients at risk for COVID-19 are in a state of rapid change based on information released by regulatory bodies including the CDC and federal and state organizations. These policies and algorithms were followed during the patient's care in the ED.  Final Clinical Impressions(s) / ED Diagnoses   Final diagnoses:  Upper respiratory tract infection, unspecified type  Mild intermittent asthma with acute exacerbation    ED Discharge Orders         Ordered    fluticasone (FLONASE) 50 MCG/ACT nasal spray  Daily PRN     08/30/18 1100    cetirizine (ZYRTEC) 5 MG tablet  Daily     08/30/18 [address] 08/30/18 1836    Dorie Rank, MD 08/31/18 1110

## 2018-08-31 LAB — NOVEL CORONAVIRUS, NAA (HOSP ORDER, SEND-OUT TO REF LAB; TAT 18-24 HRS): SARS-CoV-2, NAA: NOT DETECTED

## 2018-09-07 ENCOUNTER — Ambulatory Visit (HOSPITAL_COMMUNITY)
Admission: EM | Admit: 2018-09-07 | Discharge: 2018-09-07 | Disposition: A | Discharge status: Home / Self Care | Payer: Self-pay | Attending: Emergency Medicine | Admitting: Emergency Medicine

## 2018-09-07 ENCOUNTER — Encounter (HOSPITAL_COMMUNITY): Payer: Self-pay | Admitting: Emergency Medicine

## 2018-09-07 ENCOUNTER — Emergency Department (HOSPITAL_COMMUNITY)
Admission: EM | Admit: 2018-09-07 | Discharge: 2018-09-07 | Disposition: A | Discharge status: Home / Self Care | Payer: Self-pay | Attending: Emergency Medicine | Admitting: Emergency Medicine

## 2018-09-07 ENCOUNTER — Encounter (HOSPITAL_COMMUNITY): Payer: Self-pay

## 2018-09-07 ENCOUNTER — Other Ambulatory Visit: Payer: Self-pay

## 2018-09-07 DIAGNOSIS — Z87891 Personal history of nicotine dependence: Secondary | ICD-10-CM | POA: Insufficient documentation

## 2018-09-07 DIAGNOSIS — Z7951 Long term (current) use of inhaled steroids: Secondary | ICD-10-CM | POA: Insufficient documentation

## 2018-09-07 DIAGNOSIS — Z20828 Contact with and (suspected) exposure to other viral communicable diseases: Secondary | ICD-10-CM | POA: Insufficient documentation

## 2018-09-07 DIAGNOSIS — J069 Acute upper respiratory infection, unspecified: Secondary | ICD-10-CM | POA: Insufficient documentation

## 2018-09-07 DIAGNOSIS — Z7952 Long term (current) use of systemic steroids: Secondary | ICD-10-CM | POA: Insufficient documentation

## 2018-09-07 DIAGNOSIS — R0981 Nasal congestion: Secondary | ICD-10-CM | POA: Insufficient documentation

## 2018-09-07 DIAGNOSIS — Z79899 Other long term (current) drug therapy: Secondary | ICD-10-CM | POA: Insufficient documentation

## 2018-09-07 DIAGNOSIS — Z5321 Procedure and treatment not carried out due to patient leaving prior to being seen by health care provider: Secondary | ICD-10-CM | POA: Insufficient documentation

## 2018-09-07 DIAGNOSIS — J45909 Unspecified asthma, uncomplicated: Secondary | ICD-10-CM | POA: Insufficient documentation

## 2018-09-07 NOTE — Discharge Instructions (Addendum)
Use your Flonase and take over-the-counter Mucinex as needed for your nasal congestion.    Your COVID test was negative on 08/30/2018.  Today's COVID test is pending.  You should self quarantine until your test result is back and is negative.    Go to the emergency department if you develop shortness of breath, high fever, severe diarrhea, or other concerning symptoms.

## 2018-09-07 NOTE — ED Provider Notes (Signed)
[place]    CSN: [id number] Arrival date & time: 09/07/18  0807     History   Chief Complaint Chief Complaint  Patient presents with  . URI    HPI Jerome Irwin is a 38 y.o. male.   Patient presents with 1-2 week history of nasal congestion.  He reports he was SOB yesterday but denies any difficulty breathing today.  He denies fever, chills, ear pain, sore throat, cough, shortness of breath, chest pain, abdominal pain, vomiting, diarrhea, rash or other symptoms.  He states he has a prescription waiting for him at the pharmacy for [facility] and will go pick it up after he leaves here.  The history is provided by the patient.    Past Medical History:  Diagnosis Date  . Asthma   . Bronchitis   . Pneumonia   . Ulcerative colitis ([place])     There are no active problems to display for this patient.   Past Surgical History:  Procedure Laterality Date  . COLONOSCOPY WITH PROPOFOL N/A 02/21/2016   Procedure: COLONOSCOPY WITH PROPOFOL;  Surgeon: Arta Silence, MD;  Location: [facility] ENDOSCOPY;  Service: Endoscopy;  Laterality: N/A;       Home Medications    Prior to Admission medications   Medication Sig Start Date End Date Taking? Authorizing Provider  sulfaSALAzine (AZULFIDINE) 500 MG tablet Take 1,000 mg by mouth 3 (three) times daily.    Yes [provider]  acetaminophen (TYLENOL) 500 MG tablet Take 1 tablet (500 mg total) by mouth every 6 (six) hours as needed. Patient not taking: Reported on 11/02/2017 10/23/17   Raylene Everts, MD  albuterol (PROVENTIL HFA;VENTOLIN HFA) 108 (90 Base) MCG/ACT inhaler Inhale 1-2 puffs into the lungs every 6 (six) hours as needed for wheezing or shortness of breath. 09/06/17   Wieters, Hallie C, PA-C  cetirizine (ZYRTEC) 5 MG tablet Take 2 tablets (10 mg total) by mouth daily. 08/30/18   Langston Masker B, PA-C  ferrous sulfate 325 (65 FE) MG EC tablet Take 325 mg by mouth 3 (three) times daily with meals.    [provider]  fluticasone (FLONASE) 50 MCG/ACT nasal spray Place 1 spray into both nostrils daily as needed for allergies or rhinitis. 08/30/18   Langston Masker B, PA-C  Fluticasone-Salmeterol (ADVAIR) 100-50 MCG/DOSE AEPB Inhale 1 puff into the lungs 2 (two) times daily. 09/06/17   Wieters, Hallie C, PA-C  Multiple Vitamin (MULTIVITAMIN) tablet Take 1 tablet by mouth daily.    [provider]  predniSONE (DELTASONE) 50 MG tablet Take 1 tablet (50 mg total) by mouth daily. 11/08/17   Ok Edwards, PA-C    Family History Family History  Problem Relation Age of Onset  . Healthy Mother   . Healthy Father     Social History Social History   Tobacco Use  . Smoking status: Former Smoker    Packs/day: 0.00  . Smokeless tobacco: Never Used  Substance Use Topics  . Alcohol use: Not Currently    Comment: former  . Drug use: Not Currently    Types: Cocaine, Marijuana    Comment: last used cocaine 2 weeks ago     Allergies   Patient has no known allergies.   Review of Systems Review of Systems  Constitutional: Negative for chills and fever.  HENT: Positive for congestion. Negative for ear pain, rhinorrhea and sore throat.   Eyes: Negative for pain and visual disturbance.  Respiratory: Negative for cough and shortness of  breath.   Cardiovascular: Negative for chest pain and palpitations.  Gastrointestinal: Negative for abdominal pain, diarrhea and vomiting.  Genitourinary: Negative for dysuria and hematuria.  Musculoskeletal: Negative for arthralgias and back pain.  Skin: Negative for color change and rash.  Neurological: Negative for seizures and syncope.  All other systems reviewed and are negative.    Physical Exam Triage Vital Signs ED Triage Vitals  Enc Vitals Group     BP 09/07/18 0828 111/78     Pulse Rate 09/07/18 0828 73     Resp 09/07/18 0828 18     Temp 09/07/18 0828 98.4 F (36.9 C)     Temp Source 09/07/18 0828 Oral     SpO2 09/07/18 0828 97 %      Weight --      Height --      Head Circumference --      Peak Flow --      Pain Score 09/07/18 0825 0     Pain Loc --      Pain Edu? --      Excl. in [place]? --    No data found.  Updated Vital Signs BP 111/78 (BP Location: Right Arm)   Pulse 73   Temp 98.4 F (36.9 C) (Oral)   Resp 18   SpO2 97%   Visual Acuity Right Eye Distance:   Left Eye Distance:   Bilateral Distance:    Right Eye Near:   Left Eye Near:    Bilateral Near:     Physical Exam Vitals signs and nursing note reviewed.  Constitutional:      Appearance: He is well-developed.  HENT:     Head: Normocephalic and atraumatic.     Right Ear: Tympanic membrane and ear canal normal.     Left Ear: Tympanic membrane and ear canal normal.     Nose: Nose normal.     Mouth/Throat:     Mouth: Mucous membranes are moist.     Pharynx: Oropharynx is clear. No oropharyngeal exudate or posterior oropharyngeal erythema.  Eyes:     Conjunctiva/sclera: Conjunctivae normal.  Neck:     Musculoskeletal: Neck supple.  Cardiovascular:     Rate and Rhythm: Normal rate and regular rhythm.     Heart sounds: No murmur.  Pulmonary:     Effort: Pulmonary effort is normal. No respiratory distress.     Breath sounds: Normal breath sounds.  Abdominal:     Palpations: Abdomen is soft.     Tenderness: There is no abdominal tenderness. There is no guarding or rebound.  Skin:    General: Skin is warm and dry.     Findings: No rash.  Neurological:     Mental Status: He is alert and oriented to person, place, and time.  Psychiatric:        Mood and Affect: Mood normal.        Behavior: Behavior normal.      UC Treatments / Results  Labs (all labs ordered are listed, but only abnormal results are displayed) Labs Reviewed  NOVEL CORONAVIRUS, NAA (HOSPITAL ORDER, SEND-OUT TO REF LAB)    EKG   Radiology No results found.  Procedures Procedures (including critical care time)  Medications Ordered in UC Medications - No  data to display  Initial Impression / Assessment and Plan / UC Course  I have reviewed the triage vital signs and the nursing notes.  Pertinent labs & imaging results that were available during my care of the patient  were reviewed by me and considered in my medical decision making (see chart for details).   URI.  Instructed patient to use the Flonase that he was prescribed but has not picked up from the pharmacy yet and to take over-the-counter plain Mucinex as needed for his nasal congestion.  Discussed that his previous COVID test was negative on 08/30/2018.  COVID test performed today.  Instructed patient to self quarantine until his test results are back.  Instructed patient to go to the emergency department if he develops shortness of breath, high fever, severe diarrhea, or other concerns.     Final Clinical Impressions(s) / UC Diagnoses   Final diagnoses:  Viral upper respiratory tract infection     Discharge Instructions     Use your Flonase and take over-the-counter Mucinex as needed for your nasal congestion.    Your COVID test was negative on 08/30/2018.  Today's COVID test is pending.  You should self quarantine until your test result is back and is negative.    Go to the emergency department if you develop shortness of breath, high fever, severe diarrhea, or other concerning symptoms.         ED Prescriptions    None     Controlled Substance Prescriptions [place] Controlled Substance Registry consulted? Not Applicable   Sharion Balloon, NP 09/07/18 [phone]

## 2018-09-07 NOTE — ED Triage Notes (Signed)
Pt reports nasal congestion. He states that he was dx'd with bronchitis last week and didn't get his prescription filled. Denies productive cough.

## 2018-09-07 NOTE — ED Triage Notes (Signed)
Patient reports congestion for 1 1/2 weeks.  Says he was sob yesterday, but not today.  Patient says he feels weak.    Says he went to hospital last night, but left.   Also seen prior to this but, didn't have money.  Still doesn't have money

## 2018-09-08 LAB — NOVEL CORONAVIRUS, NAA (HOSP ORDER, SEND-OUT TO REF LAB; TAT 18-24 HRS): SARS-CoV-2, NAA: NOT DETECTED

## 2018-09-20 ENCOUNTER — Emergency Department (HOSPITAL_COMMUNITY)
Admission: EM | Admit: 2018-09-20 | Discharge: 2018-09-20 | Disposition: A | Discharge status: Home / Self Care | Payer: Self-pay | Attending: Emergency Medicine | Admitting: Emergency Medicine

## 2018-09-20 ENCOUNTER — Encounter (HOSPITAL_COMMUNITY): Payer: Self-pay

## 2018-09-20 ENCOUNTER — Other Ambulatory Visit: Payer: Self-pay

## 2018-09-20 DIAGNOSIS — Z87891 Personal history of nicotine dependence: Secondary | ICD-10-CM | POA: Insufficient documentation

## 2018-09-20 DIAGNOSIS — J45909 Unspecified asthma, uncomplicated: Secondary | ICD-10-CM | POA: Insufficient documentation

## 2018-09-20 DIAGNOSIS — Z79899 Other long term (current) drug therapy: Secondary | ICD-10-CM | POA: Insufficient documentation

## 2018-09-20 DIAGNOSIS — R0602 Shortness of breath: Secondary | ICD-10-CM | POA: Insufficient documentation

## 2018-09-20 DIAGNOSIS — Z202 Contact with and (suspected) exposure to infections with a predominantly sexual mode of transmission: Secondary | ICD-10-CM | POA: Insufficient documentation

## 2018-09-20 LAB — URINALYSIS, ROUTINE W REFLEX MICROSCOPIC
Bacteria, UA: NONE SEEN
Bilirubin Urine: NEGATIVE
Glucose, UA: NEGATIVE mg/dL
Ketones, ur: NEGATIVE mg/dL
Leukocytes,Ua: NEGATIVE
Nitrite: NEGATIVE
Protein, ur: NEGATIVE mg/dL
Specific Gravity, Urine: 1.021 (ref 1.005–1.030)
pH: 5 (ref 5.0–8.0)

## 2018-09-20 MED ORDER — METRONIDAZOLE 500 MG PO TABS: 2000.0000 mg | ORAL_TABLET | Freq: Once | ORAL | 0 refills | Status: AC

## 2018-09-20 NOTE — ED Provider Notes (Signed)
[place] DEPT Provider Note   CSN: [id number] Arrival date & time: 09/20/18  1216     History   Chief Complaint Chief Complaint  Patient presents with  . Shortness of Breath    HPI Jerome Irwin is a 38 y.o. male.  Presents emerge department with chief complaint shortness of breath as well as exposure to trichomonas.  Patient reports that his partner was diagnosed with trichomoniasis recently and he is interested in receiving treatment for this.  Has not received treatment for this.  Patient states that he has noted mild urge when going to the bathroom but denies frank dysuria, no penile discharge.  States his partner tested negative for gonorrhea chlamydia and he is not interested in being tested for gonorrhea chlamydia or HIV.  Patient states that he has episodes of feeling short of breath after he smokes a large quantity of marijuana.  States normally he takes a puff from his inhaler and has significant relief however this morning after smoking large quantity of marijuana he tried his inhaler and it did not seem to help.  Currently his shortness of breath has resolved and he has no ongoing complaints.  No chest pain or fever or cough, no sick contacts.  Per chart review patient seen in emergency department for similar symptoms related shortness of breath, prior chest x-ray negative, prior COVID test x2-.     HPI  Past Medical History:  Diagnosis Date  . Asthma   . Bronchitis   . Pneumonia   . Ulcerative colitis ([place])     There are no active problems to display for this patient.   Past Surgical History:  Procedure Laterality Date  . COLONOSCOPY WITH PROPOFOL N/A 02/21/2016   Procedure: COLONOSCOPY WITH PROPOFOL;  Surgeon: Arta Silence, MD;  [facility]: [facility];  Service: Endoscopy;  Laterality: N/A;        Home Medications    Prior to Admission medications   Medication Sig Start Date End Date Taking? Authorizing Provider   acetaminophen (TYLENOL) 500 MG tablet Take 1 tablet (500 mg total) by mouth every 6 (six) hours as needed. Patient not taking: Reported on 11/02/2017 10/23/17   Raylene Everts, MD  albuterol (PROVENTIL HFA;VENTOLIN HFA) 108 (90 Base) MCG/ACT inhaler Inhale 1-2 puffs into the lungs every 6 (six) hours as needed for wheezing or shortness of breath. 09/06/17   Wieters, Hallie C, PA-C  cetirizine (ZYRTEC) 5 MG tablet Take 2 tablets (10 mg total) by mouth daily. 08/30/18   Langston Masker B, PA-C  ferrous sulfate 325 (65 FE) MG EC tablet Take 325 mg by mouth 3 (three) times daily with meals.    [provider]  fluticasone (FLONASE) 50 MCG/ACT nasal spray Place 1 spray into both nostrils daily as needed for allergies or rhinitis. 08/30/18   Langston Masker B, PA-C  Fluticasone-Salmeterol (ADVAIR) 100-50 MCG/DOSE AEPB Inhale 1 puff into the lungs 2 (two) times daily. 09/06/17   Wieters, Hallie C, PA-C  Multiple Vitamin (MULTIVITAMIN) tablet Take 1 tablet by mouth daily.    [provider]  predniSONE (DELTASONE) 50 MG tablet Take 1 tablet (50 mg total) by mouth daily. 11/08/17   Tasia Catchings, Amy V, PA-C  sulfaSALAzine (AZULFIDINE) 500 MG tablet Take 1,000 mg by mouth 3 (three) times daily.     [provider]    Family History Family History  Problem Relation Age of Onset  . Healthy Mother   . Healthy Father     Social  History Social History   Tobacco Use  . Smoking status: Former Smoker    Packs/day: 0.00  . Smokeless tobacco: Never Used  Substance Use Topics  . Alcohol use: Not Currently    Comment: former  . Drug use: Not Currently    Types: Cocaine, Marijuana    Comment: last used cocaine 2 weeks ago     Allergies   Patient has no known allergies.   Review of Systems Review of Systems  Constitutional: Negative for chills and fever.  HENT: Negative for ear pain and sore throat.   Eyes: Negative for pain and visual disturbance.  Respiratory: Positive for shortness  of breath. Negative for cough.   Cardiovascular: Negative for chest pain and palpitations.  Gastrointestinal: Negative for abdominal pain and vomiting.  Genitourinary: Negative for dysuria and hematuria.  Musculoskeletal: Negative for arthralgias and back pain.  Skin: Negative for color change and rash.  Neurological: Negative for seizures and syncope.  All other systems reviewed and are negative.    Physical Exam Updated Vital Signs BP 120/82 (BP Location: Right Arm)   Pulse 80   Temp 98.3 F (36.8 C) (Oral)   Resp 18   Ht 5' 7"  (1.702 m)   Wt 90.7 kg   SpO2 96%   BMI 31.32 kg/m   Physical Exam Vitals signs and nursing note reviewed.  Constitutional:      Appearance: He is well-developed.  HENT:     Head: Normocephalic and atraumatic.  Eyes:     Conjunctiva/sclera: Conjunctivae normal.  Neck:     Musculoskeletal: Neck supple.  Cardiovascular:     Rate and Rhythm: Normal rate and regular rhythm.     Heart sounds: No murmur.  Pulmonary:     Effort: Pulmonary effort is normal. No respiratory distress.     Breath sounds: Normal breath sounds.  Abdominal:     Palpations: Abdomen is soft.     Tenderness: There is no abdominal tenderness.  Skin:    General: Skin is warm and dry.  Neurological:     Mental Status: He is alert.      ED Treatments / Results  Labs (all labs ordered are listed, but only abnormal results are displayed) Labs Reviewed - No data to display  EKG None  Radiology No results found.  Procedures Procedures (including critical care time)  Medications Ordered in ED Medications - No data to display   Initial Impression / Assessment and Plan / ED Course  I have reviewed the triage vital signs and the nursing notes.  Pertinent labs & imaging results that were available during my care of the patient were reviewed by me and considered in my medical decision making (see chart for details).  Clinical Course as of Sep 20 1234  Sun Sep 20, 2018  1234 Review chart, nursing notes and vitals will assess patient   [RD]    Clinical Course User Index [RD] Lucrezia Starch, MD      69-year-old male presented with 2 complaints.  Regarding shortness of breath, reports associated with smoking marijuana.  At time of my interview patient denied any ongoing shortness of breath, recent chest x-ray was negative, his pulse ox was normal, he had no tachypnea and had clear breath sounds bilaterally.  My suspicion for acute pulmonary process is very low.  Given above, will defer further testing.  EKG was performed which was unremarkable.  Regarding exposure to trichomonas, provided prescription for Flagyl.  Urine negative for infection.  I recommended STD check however patient declined.  Precautions, will discharge home.    After the discussed management above, the patient was determined to be safe for discharge.  The patient was in agreement with this plan and all questions regarding their care were answered.  ED return precautions were discussed and the patient will return to the ED with any significant worsening of condition.   Final Clinical Impressions(s) / ED Diagnoses   Final diagnoses:  Exposure to trichomonas  SOB (shortness of breath)    ED Discharge Orders    None       Lucrezia Starch, MD 09/20/18 1348

## 2018-09-20 NOTE — ED Triage Notes (Signed)
Shortness of breath x 2 days no cough no fever able to speak in full sentences.

## 2018-09-20 NOTE — Discharge Instructions (Addendum)
Recommend taking antibiotic as prescribed for treatment for the exposure to trichomonas.  Recommend stopping smoking marijuana and tobacco.  Recommend following up with your primary doctor.  If you develop difficulty breathing, chest pain, other new concerning symptom recommend return here for recheck.

## 2018-09-22 ENCOUNTER — Other Ambulatory Visit: Payer: Self-pay

## 2018-09-22 ENCOUNTER — Ambulatory Visit (HOSPITAL_COMMUNITY)
Admission: EM | Admit: 2018-09-22 | Discharge: 2018-09-22 | Disposition: A | Discharge status: Home / Self Care | Payer: Self-pay | Attending: Family Medicine | Admitting: Family Medicine

## 2018-09-22 ENCOUNTER — Encounter (HOSPITAL_COMMUNITY): Payer: Self-pay | Admitting: Emergency Medicine

## 2018-09-22 DIAGNOSIS — A599 Trichomoniasis, unspecified: Secondary | ICD-10-CM

## 2018-09-22 MED ORDER — METRONIDAZOLE 500 MG PO TABS: 2000.0000 mg | ORAL_TABLET | Freq: Once | ORAL | 0 refills | Status: AC

## 2018-09-22 NOTE — ED Triage Notes (Signed)
Pt states he was treated for trich on Sunday but did not wait to have sexual intercourse. Pt needs to be retreated.

## 2018-09-22 NOTE — ED Provider Notes (Signed)
[place]   [id number] 09/22/18 Arrival Time: 7858  ASSESSMENT & PLAN:  1. Trichomoniasis     He prefers to re-treat with single dose of Flagyl.  Meds ordered this encounter  Medications  . metroNIDAZOLE (FLAGYL) 500 MG tablet    Sig: Take 4 tablets (2,000 mg total) by mouth once for 1 dose.    Dispense:  4 tablet    Refill:  0   Declines STD testing. May f/u as needed.  Reviewed expectations re: course of current medical issues. Questions answered. Outlined signs and symptoms indicating need for more acute intervention. Patient verbalized understanding. After Visit Summary given.   SUBJECTIVE:  Jerome Irwin is a 38 y.o. male who reports being treated for presumed trichomonas 48 hours ago; seen in ED. Reports that he had sexual intercourse with same partner the day after. Requests re-treatment; "I'll wait this time to have sex." Currently without symptoms.  ROS: As per HPI.  OBJECTIVE:  Vitals:   09/22/18 1050  BP: 119/72  Pulse: 72  Resp: 18  Temp: 98.1 F (36.7 C)  SpO2: 98%     General appearance: alert, cooperative, appears stated age and no distress Throat: lips, mucosa, and tongue normal; teeth and gums normal Back: no CVA tenderness; FROM at waist Abdomen: soft, non-tender GU: deferred Skin: warm and dry Psychological: alert and cooperative; normal mood and affect.  Results for orders placed or performed during the hospital encounter of 09/20/18  Urinalysis, Routine w reflex microscopic  Result Value Ref Range   Color, Urine YELLOW YELLOW   APPearance CLEAR CLEAR   Specific Gravity, Urine 1.021 1.005 - 1.030   pH 5.0 5.0 - 8.0   Glucose, UA NEGATIVE NEGATIVE mg/dL   Hgb urine dipstick SMALL (A) NEGATIVE   Bilirubin Urine NEGATIVE NEGATIVE   Ketones, ur NEGATIVE NEGATIVE mg/dL   Protein, ur NEGATIVE NEGATIVE mg/dL   Nitrite NEGATIVE NEGATIVE   Leukocytes,Ua NEGATIVE NEGATIVE   RBC / HPF 0-5 0 - 5 RBC/hpf   WBC, UA 0-5 0 - 5  WBC/hpf   Bacteria, UA NONE SEEN NONE SEEN   Mucus PRESENT      No Known Allergies  Past Medical History:  Diagnosis Date  . Asthma   . Bronchitis   . Pneumonia   . Ulcerative colitis ([place])    Family History  Problem Relation Age of Onset  . Healthy Mother   . Healthy Father    Social History   Socioeconomic History  . Marital status: Single    Spouse name: Not on file  . Number of children: Not on file  . Years of education: Not on file  . Highest education level: Not on file  Occupational History  . Not on file  Social Needs  . Financial resource strain: Not on file  . Food insecurity    Worry: Not on file    Inability: Not on file  . Transportation needs    Medical: Not on file    Non-medical: Not on file  Tobacco Use  . Smoking status: Former Smoker    Packs/day: 0.00  . Smokeless tobacco: Never Used  Substance and Sexual Activity  . Alcohol use: Not Currently    Comment: former  . Drug use: Not Currently    Types: Cocaine, Marijuana    Comment: last used cocaine 2 weeks ago  . Sexual activity: Yes  Lifestyle  . Physical activity    Days per week: Not on file    Minutes  per session: Not on file  . Stress: Not on file  Relationships  . Social Herbalist on phone: Not on file    Gets together: Not on file    Attends religious service: Not on file    Active member of club or organization: Not on file    Attends meetings of clubs or organizations: Not on file    Relationship status: Not on file  . Intimate partner violence    Fear of current or ex partner: Not on file    Emotionally abused: Not on file    Physically abused: Not on file    Forced sexual activity: Not on file  Other Topics Concern  . Not on file  Social History Narrative  . Not on file          Vanessa Kick, MD 09/22/18 1224

## 2018-09-29 ENCOUNTER — Other Ambulatory Visit: Payer: Self-pay

## 2018-09-29 ENCOUNTER — Emergency Department (HOSPITAL_COMMUNITY)
Admission: EM | Admit: 2018-09-29 | Discharge: 2018-09-29 | Disposition: A | Discharge status: Home / Self Care | Payer: Self-pay | Attending: Emergency Medicine | Admitting: Emergency Medicine

## 2018-09-29 ENCOUNTER — Encounter (HOSPITAL_COMMUNITY): Payer: Self-pay

## 2018-09-29 DIAGNOSIS — Z87891 Personal history of nicotine dependence: Secondary | ICD-10-CM | POA: Insufficient documentation

## 2018-09-29 DIAGNOSIS — J45909 Unspecified asthma, uncomplicated: Secondary | ICD-10-CM | POA: Insufficient documentation

## 2018-09-29 DIAGNOSIS — F1410 Cocaine abuse, uncomplicated: Secondary | ICD-10-CM

## 2018-09-29 DIAGNOSIS — J988 Other specified respiratory disorders: Secondary | ICD-10-CM

## 2018-09-29 DIAGNOSIS — J398 Other specified diseases of upper respiratory tract: Secondary | ICD-10-CM

## 2018-09-29 DIAGNOSIS — F1210 Cannabis abuse, uncomplicated: Secondary | ICD-10-CM | POA: Insufficient documentation

## 2018-09-29 MED ORDER — DEXAMETHASONE 4 MG PO TABS
10.0000 mg | ORAL_TABLET | Freq: Once | ORAL | Status: AC
  Administered 2018-09-29: 10 mg via ORAL
  Filled 2018-09-29: qty 2

## 2018-09-29 MED ORDER — OXYMETAZOLINE HCL 0.05 % NA SOLN
1.0000 | Freq: Once | NASAL | Status: AC
  Administered 2018-09-29: 1 via NASAL
  Filled 2018-09-29: qty 30

## 2018-09-29 NOTE — ED Triage Notes (Signed)
Pt states that he gets congested after smoking

## 2018-09-30 NOTE — ED Provider Notes (Signed)
[facility] DEPT Provider Note   CSN: [id number] Arrival date & time: 09/29/18  0115     History   Chief Complaint No chief complaint on file.   HPI Jerome Irwin is a 38 y.o. male.      URI Presenting symptoms: congestion   Severity:  Mild Onset quality:  Gradual Timing:  Constant Chronicity:  Recurrent Relieved by:  None tried Exacerbated by: cocaine use and smoking. Ineffective treatments:  None tried   Past Medical History:  Diagnosis Date  . Asthma   . Bronchitis   . Pneumonia   . Ulcerative colitis ([place])     There are no active problems to display for this patient.   Past Surgical History:  Procedure Laterality Date  . COLONOSCOPY WITH PROPOFOL N/A 02/21/2016   Procedure: COLONOSCOPY WITH PROPOFOL;  Surgeon: Arta Silence, MD;  Location: [facility];  Service: Endoscopy;  Laterality: N/A;        Home Medications    Prior to Admission medications   Medication Sig Start Date End Date Taking? Authorizing Provider  albuterol (PROVENTIL HFA;VENTOLIN HFA) 108 (90 Base) MCG/ACT inhaler Inhale 1-2 puffs into the lungs every 6 (six) hours as needed for wheezing or shortness of breath. 09/06/17   Wieters, Hallie C, PA-C  cetirizine (ZYRTEC) 5 MG tablet Take 2 tablets (10 mg total) by mouth daily. 08/30/18   Langston Masker B, PA-C  ferrous sulfate 325 (65 FE) MG EC tablet Take 325 mg by mouth 3 (three) times daily with meals.    [provider]  fluticasone (FLONASE) 50 MCG/ACT nasal spray Place 1 spray into both nostrils daily as needed for allergies or rhinitis. 08/30/18   Langston Masker B, PA-C  Fluticasone-Salmeterol (ADVAIR) 100-50 MCG/DOSE AEPB Inhale 1 puff into the lungs 2 (two) times daily. 09/06/17   Wieters, Hallie C, PA-C  Multiple Vitamin (MULTIVITAMIN) tablet Take 1 tablet by mouth daily.    [provider]  predniSONE (DELTASONE) 50 MG tablet Take 1 tablet (50 mg total) by mouth daily. 11/08/17   Tasia Catchings,  Amy V, PA-C  sulfaSALAzine (AZULFIDINE) 500 MG tablet Take 1,000 mg by mouth 3 (three) times daily.     [provider]    Family History Family History  Problem Relation Age of Onset  . Healthy Mother   . Healthy Father     Social History Social History   Tobacco Use  . Smoking status: Former Smoker    Packs/day: 0.00  . Smokeless tobacco: Never Used  Substance Use Topics  . Alcohol use: Not Currently    Comment: former  . Drug use: Not Currently    Types: Cocaine, Marijuana    Comment: last used cocaine 2 weeks ago     Allergies   Patient has no known allergies.   Review of Systems Review of Systems  HENT: Positive for congestion.   All other systems reviewed and are negative.    Physical Exam Updated Vital Signs There were no vitals taken for this visit.  Physical Exam Vitals signs and nursing note reviewed.  Constitutional:      Appearance: He is well-developed.  HENT:     Head: Normocephalic and atraumatic.     Nose:     Comments: Partially eroded septum with cocaine remnants    Mouth/Throat:     Mouth: Mucous membranes are dry.     Pharynx: Oropharynx is clear.  Eyes:     Extraocular Movements: Extraocular movements intact.  Conjunctiva/sclera: Conjunctivae normal.  Neck:     Musculoskeletal: Normal range of motion.  Cardiovascular:     Rate and Rhythm: Normal rate.  Pulmonary:     Effort: Pulmonary effort is normal. No respiratory distress.  Abdominal:     General: Bowel sounds are normal. There is no distension.  Musculoskeletal: Normal range of motion.  Skin:    General: Skin is warm and dry.  Neurological:     General: No focal deficit present.     Mental Status: He is alert.      ED Treatments / Results  Labs (all labs ordered are listed, but only abnormal results are displayed) Labs Reviewed - No data to display  EKG None  Radiology No results found.  Procedures Procedures (including critical care time)   Medications Ordered in ED Medications  oxymetazoline (AFRIN) 0.05 % nasal spray 1 spray (1 spray Each Nare Given 09/29/18 0239)  dexamethasone (DECADRON) tablet 10 mg (10 mg Oral Given 09/29/18 0239)     Initial Impression / Assessment and Plan / ED Course  I have reviewed the triage vital signs and the nursing notes.  Pertinent labs & imaging results that were available during my care of the patient were reviewed by me and considered in my medical decision making (see chart for details).        Suspect symptoms are from frequent cocaine abuse. Requests steroids for his breathing, sounded fine to me, provided 2/2 request. Discharge with substance abuse precautions  Final Clinical Impressions(s) / ED Diagnoses   Final diagnoses:  Cocaine abuse ([place])  Congestion of upper respiratory tract    ED Discharge Orders    None       Tina Gruner, Corene Cornea, MD 09/30/18 [phone]

## 2018-10-01 ENCOUNTER — Encounter (HOSPITAL_COMMUNITY): Payer: Self-pay | Admitting: Emergency Medicine

## 2018-10-01 ENCOUNTER — Emergency Department (HOSPITAL_COMMUNITY)
Admission: EM | Admit: 2018-10-01 | Discharge: 2018-10-01 | Disposition: A | Discharge status: Home / Self Care | Payer: Self-pay | Attending: Emergency Medicine | Admitting: Emergency Medicine

## 2018-10-01 ENCOUNTER — Other Ambulatory Visit: Payer: Self-pay

## 2018-10-01 DIAGNOSIS — R0981 Nasal congestion: Secondary | ICD-10-CM | POA: Insufficient documentation

## 2018-10-01 DIAGNOSIS — Z5321 Procedure and treatment not carried out due to patient leaving prior to being seen by health care provider: Secondary | ICD-10-CM | POA: Insufficient documentation

## 2018-10-01 NOTE — ED Triage Notes (Addendum)
Per pt, states he has a history of bronchitis-states he has been having to use his inhaler and feels he is getting minimal relief-nasal congestion-was seen on 9/1 for same symptoms

## 2018-10-07 ENCOUNTER — Emergency Department (HOSPITAL_COMMUNITY)
Admission: EM | Admit: 2018-10-07 | Discharge: 2018-10-07 | Discharge status: Against Medical Advice | Payer: Self-pay | Attending: Emergency Medicine | Admitting: Emergency Medicine

## 2018-10-07 ENCOUNTER — Encounter (HOSPITAL_COMMUNITY): Payer: Self-pay | Admitting: Emergency Medicine

## 2018-10-07 ENCOUNTER — Other Ambulatory Visit: Payer: Self-pay

## 2018-10-07 DIAGNOSIS — R0981 Nasal congestion: Secondary | ICD-10-CM | POA: Insufficient documentation

## 2018-10-07 DIAGNOSIS — Z5321 Procedure and treatment not carried out due to patient leaving prior to being seen by health care provider: Secondary | ICD-10-CM | POA: Insufficient documentation

## 2018-10-07 NOTE — ED Triage Notes (Signed)
Patient reports nasal congestion and SOB when smoking x3 days. Hx bronchitis. Recently seen for same. Speaking in full sentences without difficulty. Ambulatory. Denies N/V/D, cough, fever.

## 2018-10-15 ENCOUNTER — Encounter (HOSPITAL_COMMUNITY): Payer: Self-pay

## 2018-10-15 ENCOUNTER — Emergency Department (HOSPITAL_COMMUNITY): Payer: Self-pay

## 2018-10-15 ENCOUNTER — Other Ambulatory Visit: Payer: Self-pay

## 2018-10-15 ENCOUNTER — Emergency Department (HOSPITAL_COMMUNITY)
Admission: EM | Admit: 2018-10-15 | Discharge: 2018-10-15 | Disposition: A | Discharge status: Home / Self Care | Payer: Self-pay | Attending: Emergency Medicine | Admitting: Emergency Medicine

## 2018-10-15 DIAGNOSIS — R079 Chest pain, unspecified: Secondary | ICD-10-CM | POA: Insufficient documentation

## 2018-10-15 DIAGNOSIS — R51 Headache: Secondary | ICD-10-CM | POA: Insufficient documentation

## 2018-10-15 DIAGNOSIS — Z5321 Procedure and treatment not carried out due to patient leaving prior to being seen by health care provider: Secondary | ICD-10-CM | POA: Insufficient documentation

## 2018-10-15 LAB — CBC
HCT: 36.9 % — ABNORMAL LOW (ref 39.0–52.0)
Hemoglobin: 11.4 g/dL — ABNORMAL LOW (ref 13.0–17.0)
MCH: 27.1 pg (ref 26.0–34.0)
MCHC: 30.9 g/dL (ref 30.0–36.0)
MCV: 87.9 fL (ref 80.0–100.0)
Platelets: 345 10*3/uL (ref 150–400)
RBC: 4.2 MIL/uL — ABNORMAL LOW (ref 4.22–5.81)
RDW: 15.2 % (ref 11.5–15.5)
WBC: 8.2 10*3/uL (ref 4.0–10.5)
nRBC: 0 % (ref 0.0–0.2)

## 2018-10-15 LAB — BASIC METABOLIC PANEL
Anion gap: 10 (ref 5–15)
BUN: 11 mg/dL (ref 6–20)
CO2: 26 mmol/L (ref 22–32)
Calcium: 9.4 mg/dL (ref 8.9–10.3)
Chloride: 103 mmol/L (ref 98–111)
Creatinine, Ser: 1.14 mg/dL (ref 0.61–1.24)
GFR calc Af Amer: >60 mL/min (ref >60)
GFR calc non Af Amer: >60 mL/min (ref >60)
Glucose, Bld: 92 mg/dL (ref 70–99)
Potassium: 3.7 mmol/L (ref 3.5–5.1)
Sodium: 139 mmol/L (ref 135–145)

## 2018-10-15 LAB — TROPONIN I (HIGH SENSITIVITY): Troponin I (High Sensitivity): 2 ng/L (ref <18)

## 2018-10-15 MED ORDER — SODIUM CHLORIDE 0.9% FLUSH: 3.0000 mL | Freq: Once | INTRAVENOUS | Status: DC

## 2018-10-15 NOTE — ED Triage Notes (Addendum)
Patient reports that he has a history of asthma and went to his PCP where he received an albuterol neb approx 1400.  Patient states he has received daily nebs from the physician x 3 days. Patient reports that he began having chest tightness and a headache following his neb today.  Patient states he has been having leg cramps x 1 month and happening more frequent.

## 2018-10-17 ENCOUNTER — Ambulatory Visit (HOSPITAL_COMMUNITY)
Admission: EM | Admit: 2018-10-17 | Discharge: 2018-10-17 | Disposition: A | Discharge status: Home / Self Care | Payer: HRSA Program | Attending: Emergency Medicine | Admitting: Emergency Medicine

## 2018-10-17 ENCOUNTER — Encounter (HOSPITAL_COMMUNITY): Payer: Self-pay

## 2018-10-17 ENCOUNTER — Other Ambulatory Visit: Payer: Self-pay

## 2018-10-17 DIAGNOSIS — Z7951 Long term (current) use of inhaled steroids: Secondary | ICD-10-CM | POA: Insufficient documentation

## 2018-10-17 DIAGNOSIS — J45909 Unspecified asthma, uncomplicated: Secondary | ICD-10-CM | POA: Insufficient documentation

## 2018-10-17 DIAGNOSIS — Z20828 Contact with and (suspected) exposure to other viral communicable diseases: Secondary | ICD-10-CM | POA: Diagnosis not present

## 2018-10-17 DIAGNOSIS — J22 Unspecified acute lower respiratory infection: Secondary | ICD-10-CM | POA: Diagnosis not present

## 2018-10-17 DIAGNOSIS — Z1159 Encounter for screening for other viral diseases: Secondary | ICD-10-CM

## 2018-10-17 DIAGNOSIS — Z79899 Other long term (current) drug therapy: Secondary | ICD-10-CM | POA: Insufficient documentation

## 2018-10-17 MED ORDER — PREDNISONE 50 MG PO TABS: 50.0000 mg | ORAL_TABLET | Freq: Every day | ORAL | 0 refills | Status: AC

## 2018-10-17 MED ORDER — AMOXICILLIN-POT CLAVULANATE 875-125 MG PO TABS: 1.0000 | ORAL_TABLET | Freq: Two times a day (BID) | ORAL | 0 refills | Status: DC

## 2018-10-17 NOTE — ED Triage Notes (Signed)
Pt presents with complaints of feeling like his "lungs are irritated" states that he has bronchitis and has been smoking marijuana. States that after he smokes he has to use his inhaler. Would like to be prescribed some steroids to help his lung "inflammation".

## 2018-10-17 NOTE — ED Provider Notes (Signed)
Jerome Irwin    CSN: [id number] Arrival date & time: 10/17/18  1745      History   Chief Complaint Chief Complaint  Patient presents with  . Medication Refill    HPI Jerome Irwin is a 38 y.o. male.   Jerome Irwin presents with complaints of chest tightness. For the past two weeks has felt weak. No cough. Some chest pain, which he feels may be from breathing treatments and smoking marijuana daily. Smokes a joint a day. Doesn't smoke cigarettes. Has an inhaler but doesn't feel like it helps. No fevers. No known ill contacts. Doesn't work. Some nasal drainage. No headache or body aches. Cramps intermittently to thighs. No wheezing. Going to his doctors office for breathing treatment.  Has been to the ER a few time recently and has always left without being seen. On 9/17 did have a chest xray, ekg and troponin completed. Left without being seen. History  Of asthma, bronchitis, penumonia, UC. History  Of drug use.      ROS per HPI, negative if not otherwise mentioned.      Past Medical History:  Diagnosis Date  . Asthma   . Bronchitis   . Pneumonia   . Ulcerative colitis ([place])     There are no active problems to display for this patient.   Past Surgical History:  Procedure Laterality Date  . COLONOSCOPY WITH PROPOFOL N/A 02/21/2016   Procedure: COLONOSCOPY WITH PROPOFOL;  Surgeon: Arta Silence, MD;  Location: [facility];  Service: Endoscopy;  Laterality: N/A;       Home Medications    Prior to Admission medications   Medication Sig Start Date End Date Taking? Authorizing Provider  albuterol (PROVENTIL HFA;VENTOLIN HFA) 108 (90 Base) MCG/ACT inhaler Inhale 1-2 puffs into the lungs every 6 (six) hours as needed for wheezing or shortness of breath. 09/06/17   Wieters, Hallie C, PA-C  amoxicillin-clavulanate (AUGMENTIN) 875-125 MG tablet Take 1 tablet by mouth every 12 (twelve) hours for 10 days. 10/17/18 10/27/18  Zigmund Gottron, NP  cetirizine  (ZYRTEC) 5 MG tablet Take 2 tablets (10 mg total) by mouth daily. 08/30/18   Langston Masker B, PA-C  ferrous sulfate 325 (65 FE) MG EC tablet Take 325 mg by mouth 3 (three) times daily with meals.    [provider]  fluticasone (FLONASE) 50 MCG/ACT nasal spray Place 1 spray into both nostrils daily as needed for allergies or rhinitis. 08/30/18   Langston Masker B, PA-C  Fluticasone-Salmeterol (ADVAIR) 100-50 MCG/DOSE AEPB Inhale 1 puff into the lungs 2 (two) times daily. 09/06/17   Wieters, Hallie C, PA-C  Multiple Vitamin (MULTIVITAMIN) tablet Take 1 tablet by mouth daily.    [provider]  predniSONE (DELTASONE) 50 MG tablet Take 1 tablet (50 mg total) by mouth daily with breakfast for 5 days. 10/17/18 10/22/18  Zigmund Gottron, NP  sulfaSALAzine (AZULFIDINE) 500 MG tablet Take 1,000 mg by mouth 3 (three) times daily.     [provider]    Family History Family History  Problem Relation Age of Onset  . Healthy Mother   . Healthy Father     Social History Social History   Tobacco Use  . Smoking status: Former Smoker    Packs/day: 0.00  . Smokeless tobacco: Never Used  Substance Use Topics  . Alcohol use: Not Currently    Comment: former  . Drug use: Not Currently    Types: Cocaine, Marijuana    Comment: last used cocaine  2 weeks ago     Allergies   Patient has no known allergies.   Review of Systems Review of Systems   Physical Exam Triage Vital Signs ED Triage Vitals  Enc Vitals Group     BP 10/17/18 1809 (!) 131/91     Pulse Rate 10/17/18 1809 92     Resp 10/17/18 1809 18     Temp 10/17/18 1809 98.2 F (36.8 C)     Temp src --      SpO2 10/17/18 1809 98 %     Weight --      Height --      Head Circumference --      Peak Flow --      Pain Score 10/17/18 1808 0     Pain Loc --      Pain Edu? --      Excl. in [place]? --    No data found.  Updated Vital Signs BP (!) 131/91   Pulse 92   Temp 98.2 F (36.8 C)   Resp 18   SpO2 98%    Visual Acuity Right Eye Distance:   Left Eye Distance:   Bilateral Distance:    Right Eye Near:   Left Eye Near:    Bilateral Near:     Physical Exam Constitutional:      Appearance: He is well-developed.  HENT:     Nose: Congestion present.  Cardiovascular:     Rate and Rhythm: Normal rate.  Pulmonary:     Effort: Pulmonary effort is normal.     Breath sounds: Examination of the right-lower field reveals decreased breath sounds. Examination of the left-lower field reveals decreased breath sounds. Decreased breath sounds present.     Comments: No increased work of breathing, speaking clearly, no cough throughout exam  Skin:    General: Skin is warm and dry.  Neurological:     Mental Status: He is alert and oriented to person, place, and time.      UC Treatments / Results  Labs (all labs ordered are listed, but only abnormal results are displayed) Labs Reviewed  NOVEL CORONAVIRUS, NAA (HOSP ORDER, SEND-OUT TO REF LAB; TAT 18-24 HRS)    EKG   Radiology No results found.  Procedures Procedures (including critical care time)  Medications Ordered in UC Medications - No data to display  Initial Impression / Assessment and Plan / UC Course  I have reviewed the triage vital signs and the nursing notes.  Pertinent labs & imaging results that were available during my care of the patient were reviewed by me and considered in my medical decision making (see chart for details).     Chest xray on 9/19 is concerning with a lingular opacity. Vitals stable here today. Will cover with augmentin, with prednisone also provided as likely asthma and smoking triggering symptoms. Return precautions provided. covid pending. Patient verbalized understanding and agreeable to plan.   Final Clinical Impressions(s) / UC Diagnoses   Final diagnoses:  Lower respiratory infection     Discharge Instructions     Complete course of antibiotics.  Complete 5 days of steroids.  Limit  smoking as this can worsen your symptoms.  Use of inhaler as needed for wheezing or shortness of breath.   If symptoms worsen or do not improve in the next week to return to be seen or to follow up with your PCP.      ED Prescriptions    Medication Sig Dispense Auth. Provider  amoxicillin-clavulanate (AUGMENTIN) 875-125 MG tablet Take 1 tablet by mouth every 12 (twelve) hours for 10 days. 20 tablet Jerome Gamble B, NP   predniSONE (DELTASONE) 50 MG tablet Take 1 tablet (50 mg total) by mouth daily with breakfast for 5 days. 5 tablet Zigmund Gottron, NP     PDMP not reviewed this encounter.   Zigmund Gottron, NP 10/17/18 1836

## 2018-10-17 NOTE — Discharge Instructions (Signed)
Complete course of antibiotics.  Complete 5 days of steroids.  Limit smoking as this can worsen your symptoms.  Use of inhaler as needed for wheezing or shortness of breath.   If symptoms worsen or do not improve in the next week to return to be seen or to follow up with your PCP.

## 2018-10-19 LAB — NOVEL CORONAVIRUS, NAA (HOSP ORDER, SEND-OUT TO REF LAB; TAT 18-24 HRS): SARS-CoV-2, NAA: NOT DETECTED

## 2018-10-20 ENCOUNTER — Other Ambulatory Visit: Payer: Self-pay

## 2018-10-20 ENCOUNTER — Emergency Department (HOSPITAL_COMMUNITY): Payer: Self-pay

## 2018-10-20 ENCOUNTER — Encounter (HOSPITAL_COMMUNITY): Payer: Self-pay | Admitting: Emergency Medicine

## 2018-10-20 DIAGNOSIS — Z5321 Procedure and treatment not carried out due to patient leaving prior to being seen by health care provider: Secondary | ICD-10-CM | POA: Insufficient documentation

## 2018-10-20 NOTE — ED Triage Notes (Signed)
Patient here from home with complaints of cough that started 2 day ago. Reports hx of bronchitis. Needs albuterol neb.

## 2018-10-21 ENCOUNTER — Emergency Department (HOSPITAL_COMMUNITY)
Admission: EM | Admit: 2018-10-21 | Discharge: 2018-10-21 | Disposition: A | Discharge status: Home / Self Care | Payer: Self-pay | Attending: Emergency Medicine | Admitting: Emergency Medicine

## 2018-10-22 ENCOUNTER — Other Ambulatory Visit: Payer: Self-pay

## 2018-10-22 ENCOUNTER — Encounter (HOSPITAL_COMMUNITY): Payer: Self-pay | Admitting: Emergency Medicine

## 2018-10-22 ENCOUNTER — Ambulatory Visit (HOSPITAL_COMMUNITY)
Admission: EM | Admit: 2018-10-22 | Discharge: 2018-10-22 | Disposition: A | Discharge status: Home / Self Care | Payer: Self-pay | Attending: Internal Medicine | Admitting: Internal Medicine

## 2018-10-22 DIAGNOSIS — J4521 Mild intermittent asthma with (acute) exacerbation: Secondary | ICD-10-CM

## 2018-10-22 MED ORDER — IPRATROPIUM-ALBUTEROL 0.5-2.5 (3) MG/3ML IN SOLN: 3.0000 mL | RESPIRATORY_TRACT | 0 refills | Status: DC | PRN

## 2018-10-22 MED ORDER — ALBUTEROL SULFATE (2.5 MG/3ML) 0.083% IN NEBU: 2.5000 mg | INHALATION_SOLUTION | Freq: Four times a day (QID) | RESPIRATORY_TRACT | 1 refills | Status: DC | PRN

## 2018-10-22 NOTE — ED Triage Notes (Signed)
Pt here for asthma sx; pt requesting neb; pt sts has neb machine at home but out of medication

## 2018-10-22 NOTE — ED Provider Notes (Signed)
[place]    CSN: [id number] Arrival date & time: 10/22/18  1412      History   Chief Complaint Chief Complaint  Patient presents with  . Asthma    HPI Jerome Irwin is a 38 y.o. male who currently smokes marijuana on a daily basis comes to the urgent care complaining of worsening shortness of breath.  Patient says the symptoms started insidiously and is gotten progressively worse.  She admits to having wheezing.  No cough or sputum production.  No fever or chills.  No nausea or vomiting.  No diarrhea.  Patient has a nebulizer machine at home but does not have a nebulizer solutions.Marland Kitchen   HPI  Past Medical History:  Diagnosis Date  . Asthma   . Bronchitis   . Pneumonia   . Ulcerative colitis ([place])     There are no active problems to display for this patient.   Past Surgical History:  Procedure Laterality Date  . COLONOSCOPY WITH PROPOFOL N/A 02/21/2016   Procedure: COLONOSCOPY WITH PROPOFOL;  Surgeon: Arta Silence, MD;  Location: [facility];  Service: Endoscopy;  Laterality: N/A;       Home Medications    Prior to Admission medications   Medication Sig Start Date End Date Taking? Authorizing Provider  albuterol (PROVENTIL HFA;VENTOLIN HFA) 108 (90 Base) MCG/ACT inhaler Inhale 1-2 puffs into the lungs every 6 (six) hours as needed for wheezing or shortness of breath. 09/06/17   Wieters, Hallie C, PA-C  cetirizine (ZYRTEC) 5 MG tablet Take 2 tablets (10 mg total) by mouth daily. 08/30/18   Langston Masker B, PA-C  ferrous sulfate 325 (65 FE) MG EC tablet Take 325 mg by mouth 3 (three) times daily with meals.    [provider]  fluticasone (FLONASE) 50 MCG/ACT nasal spray Place 1 spray into both nostrils daily as needed for allergies or rhinitis. 08/30/18   Langston Masker B, PA-C  Fluticasone-Salmeterol (ADVAIR) 100-50 MCG/DOSE AEPB Inhale 1 puff into the lungs 2 (two) times daily. 09/06/17   Wieters, Hallie C, PA-C  ipratropium-albuterol (DUONEB)  0.5-2.5 (3) MG/3ML SOLN Take 3 mLs by nebulization every 4 (four) hours as needed. 10/22/18   Chase Picket, MD  Multiple Vitamin (MULTIVITAMIN) tablet Take 1 tablet by mouth daily.    [provider]  predniSONE (DELTASONE) 50 MG tablet Take 1 tablet (50 mg total) by mouth daily with breakfast for 5 days. 10/17/18 10/22/18  Zigmund Gottron, NP  sulfaSALAzine (AZULFIDINE) 500 MG tablet Take 1,000 mg by mouth 3 (three) times daily.     [provider]    Family History Family History  Problem Relation Age of Onset  . Healthy Mother   . Healthy Father     Social History Social History   Tobacco Use  . Smoking status: Current Every Day Smoker    Packs/day: 0.00  . Smokeless tobacco: Never Used  Substance Use Topics  . Alcohol use: Not Currently    Comment: former  . Drug use: Not Currently    Types: Cocaine, Marijuana    Comment: last used cocaine 2 weeks ago     Allergies   Patient has no known allergies.   Review of Systems Review of Systems  Constitutional: Negative.   HENT: Negative.   Eyes: Negative.   Respiratory: Positive for shortness of breath and wheezing. Negative for chest tightness.   Cardiovascular: Negative for chest pain.  Gastrointestinal: Negative.   Musculoskeletal: Negative.   Neurological: Negative.  Physical Exam Triage Vital Signs ED Triage Vitals  Enc Vitals Group     BP 10/22/18 1427 117/81     Pulse Rate 10/22/18 1427 75     Resp 10/22/18 1427 18     Temp 10/22/18 1427 98.2 F (36.8 C)     Temp Source 10/22/18 1427 Oral     SpO2 10/22/18 1427 96 %     Weight --      Height --      Head Circumference --      Peak Flow --      Pain Score 10/22/18 1428 2     Pain Loc --      Pain Edu? --      Excl. in [place]? --    No data found.  Updated Vital Signs BP 117/81 (BP Location: Right Arm)   Pulse 75   Temp 98.2 F (36.8 C) (Oral)   Resp 18   SpO2 96%   Visual Acuity Right Eye Distance:   Left Eye  Distance:   Bilateral Distance:    Right Eye Near:   Left Eye Near:    Bilateral Near:     Physical Exam   UC Treatments / Results  Labs (all labs ordered are listed, but only abnormal results are displayed) Labs Reviewed - No data to display  EKG   Radiology Dg Chest 2 View  Result Date: 10/20/2018 CLINICAL DATA:  Cough EXAM: CHEST - 2 VIEW COMPARISON:  10/15/2018 FINDINGS: Heart and mediastinal contours are within normal limits. No focal opacities or effusions. No acute bony abnormality. IMPRESSION: No active cardiopulmonary disease. Electronically Signed   By: Rolm Baptise M.D.   On: 10/20/2018 20:10    Procedures Procedures (including critical care time)  Medications Ordered in UC Medications - No data to display  Initial Impression / Assessment and Plan / UC Course  I have reviewed the triage vital signs and the nursing notes.  Pertinent labs & imaging results that were available during my care of the patient were reviewed by me and considered in my medical decision making (see chart for details).     1.  Mild intermittent asthma with acute exacerbation: Albuterol nebulizer solution to be used every 4-6 hours as needed Prednisone 40 mg orally daily If patient's symptoms worsens he is advised to go to the emergency department to be evaluated and treated. Final Clinical Impressions(s) / UC Diagnoses   Final diagnoses:  Mild intermittent asthma with (acute) exacerbation   Discharge Instructions   None    ED Prescriptions    Medication Sig Dispense Auth. Provider   ipratropium-albuterol (DUONEB) 0.5-2.5 (3) MG/3ML SOLN Take 3 mLs by nebulization every 4 (four) hours as needed. 360 mL Dayanira Giovannetti, Myrene Galas, MD     PDMP not reviewed this encounter.   Chase Picket, MD 10/23/18 [phone]

## 2018-10-29 ENCOUNTER — Encounter (HOSPITAL_COMMUNITY): Payer: Self-pay | Admitting: Emergency Medicine

## 2018-10-29 ENCOUNTER — Other Ambulatory Visit: Payer: Self-pay

## 2018-10-29 ENCOUNTER — Ambulatory Visit (HOSPITAL_COMMUNITY)
Admission: EM | Admit: 2018-10-29 | Discharge: 2018-10-29 | Disposition: A | Discharge status: Home / Self Care | Payer: Self-pay | Attending: Family Medicine | Admitting: Family Medicine

## 2018-10-29 DIAGNOSIS — Z79899 Other long term (current) drug therapy: Secondary | ICD-10-CM | POA: Insufficient documentation

## 2018-10-29 DIAGNOSIS — M546 Pain in thoracic spine: Secondary | ICD-10-CM | POA: Insufficient documentation

## 2018-10-29 DIAGNOSIS — R252 Cramp and spasm: Secondary | ICD-10-CM | POA: Insufficient documentation

## 2018-10-29 DIAGNOSIS — F1210 Cannabis abuse, uncomplicated: Secondary | ICD-10-CM | POA: Insufficient documentation

## 2018-10-29 DIAGNOSIS — Z7951 Long term (current) use of inhaled steroids: Secondary | ICD-10-CM | POA: Insufficient documentation

## 2018-10-29 DIAGNOSIS — Z20828 Contact with and (suspected) exposure to other viral communicable diseases: Secondary | ICD-10-CM | POA: Insufficient documentation

## 2018-10-29 DIAGNOSIS — J45909 Unspecified asthma, uncomplicated: Secondary | ICD-10-CM | POA: Insufficient documentation

## 2018-10-29 MED ORDER — CYCLOBENZAPRINE HCL 5 MG PO TABS: 5.0000 mg | ORAL_TABLET | Freq: Three times a day (TID) | ORAL | 0 refills | Status: DC | PRN

## 2018-10-29 NOTE — ED Provider Notes (Signed)
MRN: [id number] DOB: 05-24-1980  Subjective:   Jerome Irwin is a 38 y.o. male presenting for request of a steroid injection.  Patient has undergone 2 courses of oral steroids in the month of September.  He also had IM injection of Decadron 09/29/2018.  Smokes marijuana daily.  States that he feels inflammation in his lungs.  His last chest x-ray 1 week ago was negative.  He also had 2 other x-rays in August and earlier September of this year.  Chart shows more than 20 chest x-rays in the past 3 to 4 years.  Patient uses albuterol nebulized, does not take Advair.  He does not have a PCP or pulmonologist.  Denies fever, chest pain, wheezing, shortness of breath.  States that he needs more steroids for inflammation in his lungs and back pain.  He plans on quitting smoking marijuana but not anytime soon.  He has not gotten tested for COVID but is agreeable to this today.  No current facility-administered medications for this encounter.   Current Outpatient Medications:  .  albuterol (PROVENTIL HFA;VENTOLIN HFA) 108 (90 Base) MCG/ACT inhaler, Inhale 1-2 puffs into the lungs every 6 (six) hours as needed for wheezing or shortness of breath., Disp: 1 Inhaler, Rfl: 0 .  albuterol (PROVENTIL) (2.5 MG/3ML) 0.083% nebulizer solution, Take 3 mLs (2.5 mg total) by nebulization every 6 (six) hours as needed for wheezing or shortness of breath., Disp: 75 mL, Rfl: 1 .  fluticasone (FLONASE) 50 MCG/ACT nasal spray, Place 1 spray into both nostrils daily as needed for allergies or rhinitis., Disp: 16 g, Rfl: 2 .  Fluticasone-Salmeterol (ADVAIR) 100-50 MCG/DOSE AEPB, Inhale 1 puff into the lungs 2 (two) times daily., Disp: 60 each, Rfl: 0 .  Multiple Vitamin (MULTIVITAMIN) tablet, Take 1 tablet by mouth daily., Disp: , Rfl:  .  sulfaSALAzine (AZULFIDINE) 500 MG tablet, Take 1,000 mg by mouth 3 (three) times daily. , Disp: , Rfl:  .  cetirizine (ZYRTEC) 5 MG tablet, Take 2 tablets (10 mg total) by mouth daily., Disp:  30 tablet, Rfl: 0 .  ferrous sulfate 325 (65 FE) MG EC tablet, Take 325 mg by mouth 3 (three) times daily with meals., Disp: , Rfl:    No Known Allergies  Past Medical History:  Diagnosis Date  . Asthma   . Bronchitis   . Pneumonia   . Ulcerative colitis [facility])      Past Surgical History:  Procedure Laterality Date  . COLONOSCOPY WITH PROPOFOL N/A 02/21/2016   Procedure: COLONOSCOPY WITH PROPOFOL;  Surgeon: Arta Silence, MD;  Location: [facility];  Service: Endoscopy;  Laterality: N/A;    ROS  Objective:   Vitals: BP (!) 147/112 (BP Location: Left Arm)   Pulse 80   Temp 97.9 F (36.6 C) (Temporal)   Resp 16   SpO2 100%   Physical Exam Constitutional:      General: He is not in acute distress.    Appearance: Normal appearance. He is well-developed. He is not ill-appearing, toxic-appearing or diaphoretic.  HENT:     Head: Normocephalic and atraumatic.     Right Ear: External ear normal.     Left Ear: External ear normal.     Nose: Nose normal.     Mouth/Throat:     Mouth: Mucous membranes are moist.     Pharynx: Oropharynx is clear.  Eyes:     General: No scleral icterus.    Extraocular Movements: Extraocular movements intact.     Pupils: Pupils are equal,  round, and reactive to light.  Cardiovascular:     Rate and Rhythm: Normal rate and regular rhythm.     Heart sounds: Normal heart sounds. No murmur. No friction rub. No gallop.   Pulmonary:     Effort: Pulmonary effort is normal. No respiratory distress.     Breath sounds: Normal breath sounds. No stridor. No wheezing, rhonchi or rales.  Musculoskeletal:     Thoracic back: He exhibits tenderness (By report only over bilateral/midline of thoracic back) and spasm (Bilateral versus thoracic region). He exhibits normal range of motion, no bony tenderness, no swelling, no edema and no deformity.  Skin:    General: Skin is warm and dry.  Neurological:     Mental Status: He is alert and oriented to person, place,  and time.  Psychiatric:        Mood and Affect: Mood normal.        Behavior: Behavior normal.        Thought Content: Thought content normal.     Assessment and Plan :   1. Acute bilateral thoracic back pain   2. Marijuana abuse   3. Muscle cramps     I refused to give patient more steroids.  He has clear lung sounds and therefore counseled against getting a chest x-ray.  Recommended patient continue to use his albuterol inhaler or nebulized albuterol.  Encourage patient to continue efforts at quitting smoking.  We will have patient hydrate better, use Flexeril for muscle spasms. Counseled patient on potential for adverse effects with medications prescribed/recommended today, ER and return-to-clinic precautions discussed, patient verbalized understanding.    Jaynee Eagles, PA-C 10/29/18 1238

## 2018-10-29 NOTE — ED Triage Notes (Signed)
Patient has tightness with breathing.  Patient is almost argumentative with attempts to get history "its not covid, ive been tested a month ago" "I need medication refill, tightness in lungs because I smoke"

## 2018-10-29 NOTE — Discharge Instructions (Signed)
Make sure you are hydrating daily very well with at least 2 liters of water. That's 64 ounces of water daily.   Salads - kale, spinach, cabbage, spring mix; use seeds like pumpkin seeds or sunflower seeds, almonds; you can also use 1-2 hard boiled eggs in your salads Fruits - avocadoes, berries (blueberries, raspberries, blackberries), apples, oranges, pomegranate, grapefruit Vegetables - aspargus, cauliflower, broccoli, green beans, brussel spouts, bell peppers; stay away from starchy vegetables like potatoes, carrots, peas  Regarding meat it is better to eat lean meats and limit your red meat consumption including pork.  Wild caught fish, chicken breast are good options.  Do not eat any foods on this list that you are allergic to.

## 2018-10-30 ENCOUNTER — Other Ambulatory Visit: Payer: Self-pay

## 2018-10-30 ENCOUNTER — Emergency Department (HOSPITAL_COMMUNITY)
Admission: EM | Admit: 2018-10-30 | Discharge: 2018-10-30 | Discharge status: Against Medical Advice | Payer: Self-pay | Attending: Emergency Medicine | Admitting: Emergency Medicine

## 2018-10-30 ENCOUNTER — Encounter (HOSPITAL_COMMUNITY): Payer: Self-pay

## 2018-10-30 DIAGNOSIS — Z5321 Procedure and treatment not carried out due to patient leaving prior to being seen by health care provider: Secondary | ICD-10-CM | POA: Insufficient documentation

## 2018-10-30 DIAGNOSIS — R0602 Shortness of breath: Secondary | ICD-10-CM | POA: Insufficient documentation

## 2018-10-30 NOTE — ED Triage Notes (Signed)
Pt reports continued SOB. He was recently seen at Urgent Care for same. Denies COVID symptoms. He states that he knows that smoking doesn't help. He is requesting an EKG because he "feels weird," but denies chest pain.

## 2018-10-30 NOTE — ED Notes (Signed)
OBSERVED PT WALKING BRISKLY FROM BATHROOM TO WAITING AREA. PT WITH NO ACUTE DISTRESS. UP TO WINDOW SPEAKING IN COMPLETE SENTENCES

## 2018-10-31 ENCOUNTER — Ambulatory Visit (HOSPITAL_COMMUNITY)
Admission: EM | Admit: 2018-10-31 | Discharge: 2018-10-31 | Disposition: A | Discharge status: Home / Self Care | Payer: Self-pay | Attending: Emergency Medicine | Admitting: Emergency Medicine

## 2018-10-31 ENCOUNTER — Encounter (HOSPITAL_COMMUNITY): Payer: Self-pay | Admitting: *Deleted

## 2018-10-31 ENCOUNTER — Other Ambulatory Visit: Payer: Self-pay

## 2018-10-31 ENCOUNTER — Ambulatory Visit (INDEPENDENT_AMBULATORY_CARE_PROVIDER_SITE_OTHER): Payer: Self-pay

## 2018-10-31 DIAGNOSIS — F1410 Cocaine abuse, uncomplicated: Secondary | ICD-10-CM

## 2018-10-31 DIAGNOSIS — F1210 Cannabis abuse, uncomplicated: Secondary | ICD-10-CM

## 2018-10-31 DIAGNOSIS — R0789 Other chest pain: Secondary | ICD-10-CM

## 2018-10-31 DIAGNOSIS — Z7151 Drug abuse counseling and surveillance of drug abuser: Secondary | ICD-10-CM

## 2018-10-31 LAB — NOVEL CORONAVIRUS, NAA (HOSP ORDER, SEND-OUT TO REF LAB; TAT 18-24 HRS): SARS-CoV-2, NAA: NOT DETECTED

## 2018-10-31 NOTE — ED Provider Notes (Signed)
[facility]    CSN: [id number] Arrival date & time: 10/31/18  1035      History   Chief Complaint Chief Complaint  Patient presents with  . Chest Pain    HPI Jerome Irwin is a 38 y.o. male.   Jerome Irwin presents with complaints of left sided chest tightness which started today. He woke feeling well. He smoked marijuana as well as used cocaine, and symptoms started. He was concerned he is having a heart attach. Some nasal drainage. Occasional cough. No fevers. No shortness of breath . No arm or jaw pain. No nausae or vomiting. States he was incarcerated and got out 2 months ago, is unemployed and somewhat bored at home. He does have family/suppert, they are aware of his drug use. Patient has been seen  Multiple times with this same complaint both here in the UC setting as well as in the ER, without acute findings. Has been provided steroids, inhalers, nebulizer's. Doesn't follow with pulmonology or a PCP.  History  Of asthma, bronchitis, pneumonia, UC.      ROS per HPI, negative if not otherwise mentioned.      Past Medical History:  Diagnosis Date  . Asthma   . Bronchitis   . Pneumonia   . Ulcerative colitis ([place])     There are no active problems to display for this patient.   Past Surgical History:  Procedure Laterality Date  . COLONOSCOPY WITH PROPOFOL N/A 02/21/2016   Procedure: COLONOSCOPY WITH PROPOFOL;  Surgeon: Arta Silence, MD;  Location: [facility];  Service: Endoscopy;  Laterality: N/A;       Home Medications    Prior to Admission medications   Medication Sig Start Date End Date Taking? Authorizing Provider  albuterol (PROVENTIL) (2.5 MG/3ML) 0.083% nebulizer solution Take 3 mLs (2.5 mg total) by nebulization every 6 (six) hours as needed for wheezing or shortness of breath. 10/22/18  Yes Lamptey, Myrene Galas, MD  fluticasone (FLONASE) 50 MCG/ACT nasal spray Place 1 spray into both nostrils daily as needed for allergies or rhinitis.  08/30/18  Yes Murray, Alyssa B, PA-C  Fluticasone-Salmeterol (ADVAIR) 100-50 MCG/DOSE AEPB Inhale 1 puff into the lungs 2 (two) times daily. 09/06/17  Yes Wieters, Hallie C, PA-C  Multiple Vitamin (MULTIVITAMIN) tablet Take 1 tablet by mouth daily.   Yes [provider]  sulfaSALAzine (AZULFIDINE) 500 MG tablet Take 1,000 mg by mouth 3 (three) times daily.    Yes [provider]  albuterol (PROVENTIL HFA;VENTOLIN HFA) 108 (90 Base) MCG/ACT inhaler Inhale 1-2 puffs into the lungs every 6 (six) hours as needed for wheezing or shortness of breath. 09/06/17   Wieters, Hallie C, PA-C  cyclobenzaprine (FLEXERIL) 5 MG tablet Take 1 tablet (5 mg total) by mouth 3 (three) times daily as needed for muscle spasms. 10/29/18   Jaynee Eagles, PA-C  cetirizine (ZYRTEC) 5 MG tablet Take 2 tablets (10 mg total) by mouth daily. 08/30/18 10/29/18  Langston Masker B, PA-C  ferrous sulfate 325 (65 FE) MG EC tablet Take 325 mg by mouth 3 (three) times daily with meals.  10/29/18  [provider]  ipratropium-albuterol (DUONEB) 0.5-2.5 (3) MG/3ML SOLN Take 3 mLs by nebulization every 4 (four) hours as needed. 10/22/18 10/22/18  Chase Picket, MD    Family History Family History  Problem Relation Age of Onset  . Healthy Mother   . Healthy Father     Social History Social History   Tobacco Use  . Smoking status: Former  Smoker    Packs/day: 0.00  . Smokeless tobacco: Never Used  Substance Use Topics  . Alcohol use: Not Currently    Comment: former  . Drug use: Yes    Types: Cocaine, Marijuana    Comment: last used cocaine today     Allergies   Patient has no known allergies.   Review of Systems Review of Systems   Physical Exam Triage Vital Signs ED Triage Vitals  Enc Vitals Group     BP 10/31/18 1111 116/84     Pulse Rate 10/31/18 1110 88     Resp 10/31/18 1110 14     Temp 10/31/18 1110 97.6 F (36.4 C)     Temp Source 10/31/18 1110 Other     SpO2 10/31/18 1110 97 %      Weight --      Height --      Head Circumference --      Peak Flow --      Pain Score --      Pain Loc --      Pain Edu? --      Excl. in [place]? --    No data found.  Updated Vital Signs BP 116/84   Pulse 88   Temp 97.6 F (36.4 C) (Other (Comment))   Resp 14   SpO2 97%    Physical Exam Constitutional:      Appearance: He is well-developed.  Cardiovascular:     Rate and Rhythm: Normal rate and regular rhythm.  Pulmonary:     Effort: Pulmonary effort is normal.     Breath sounds: Normal breath sounds.  Skin:    General: Skin is warm and dry.  Neurological:     Mental Status: He is alert and oriented to person, place, and time.     EKG:  NSR rate of 83 . Previous EKG was available for review. No stwave changes as interpreted by me.    UC Treatments / Results  Labs (all labs ordered are listed, but only abnormal results are displayed) Labs Reviewed - No data to display  EKG   Radiology Dg Chest 2 View  Result Date: 10/31/2018 CLINICAL DATA:  Chest pain EXAM: CHEST - 2 VIEW COMPARISON:  10/20/2018 FINDINGS: The heart size and mediastinal contours are within normal limits. Both lungs are clear. The visualized skeletal structures are unremarkable. IMPRESSION: No acute abnormality of the lungs. Electronically Signed   By: Eddie Candle M.D.   On: 10/31/2018 11:41    Procedures Procedures (including critical care time)  Medications Ordered in UC Medications - No data to display  Initial Impression / Assessment and Plan / UC Course  I have reviewed the triage vital signs and the nursing notes.  Pertinent labs & imaging results that were available during my care of the patient were reviewed by me and considered in my medical decision making (see chart for details).     Non toxic. Benign physical exam. ekg and chest xray reassuring. This is a frequent visit and complaint for this patient, unfortunately, is presence of drug abuse. Discussed this at length with patient,  that his symptoms are related to his smoking and use of cocaine. Discussed risks associated with these as well. Encouraged decrease to quit, use of resources if needed. Return precautions provided. Patient verbalized understanding and agreeable to plan.    Final Clinical Impressions(s) / UC Diagnoses   Final diagnoses:  Chest tightness  Cocaine abuse ([place])  Marijuana abuse  Discharge Instructions     Your ekg, vital signs, chest xray are normal today which is reassuring.  I feel that your symptoms are directly related to your use of cocaine and marijuana.  Please decrease use to quit, or seek treatment for assistance in discontinuing use.      ED Prescriptions    None     PDMP not reviewed this encounter.   Zigmund Gottron, NP 10/31/18 1152

## 2018-10-31 NOTE — ED Triage Notes (Addendum)
C/O intermittent chest tightness, especially when smoking, over past few days with some nasal congestion.  States then has SOB after onset.  Denies fever, cough, lightheadedness, nausea.

## 2018-10-31 NOTE — Discharge Instructions (Addendum)
Your ekg, vital signs, chest xray are normal today which is reassuring.  I feel that your symptoms are directly related to your use of cocaine and marijuana.  Please decrease use to quit, or seek treatment for assistance in discontinuing use.

## 2018-11-05 ENCOUNTER — Ambulatory Visit (HOSPITAL_COMMUNITY)
Admission: EM | Admit: 2018-11-05 | Discharge: 2018-11-05 | Disposition: A | Discharge status: Home / Self Care | Payer: Self-pay | Attending: Family Medicine | Admitting: Family Medicine

## 2018-11-05 ENCOUNTER — Emergency Department (HOSPITAL_COMMUNITY): Admission: EM | Admit: 2018-11-05 | Discharge: 2018-11-05 | Discharge status: Against Medical Advice | Payer: Self-pay

## 2018-11-05 ENCOUNTER — Encounter (HOSPITAL_COMMUNITY): Payer: Self-pay

## 2018-11-05 ENCOUNTER — Other Ambulatory Visit: Payer: Self-pay

## 2018-11-05 DIAGNOSIS — R300 Dysuria: Secondary | ICD-10-CM | POA: Insufficient documentation

## 2018-11-05 DIAGNOSIS — Z113 Encounter for screening for infections with a predominantly sexual mode of transmission: Secondary | ICD-10-CM

## 2018-11-05 DIAGNOSIS — Z202 Contact with and (suspected) exposure to infections with a predominantly sexual mode of transmission: Secondary | ICD-10-CM

## 2018-11-05 LAB — RAPID HIV SCREEN (HIV 1/2 AB+AG)
HIV 1/2 Antibodies: NONREACTIVE
HIV-1 P24 Antigen - HIV24: NONREACTIVE

## 2018-11-05 MED ORDER — CEFTRIAXONE SODIUM 250 MG IJ SOLR
250.0000 mg | Freq: Once | INTRAMUSCULAR | Status: AC
  Administered 2018-11-05: 250 mg via INTRAMUSCULAR

## 2018-11-05 MED ORDER — CEFTRIAXONE SODIUM 250 MG IJ SOLR
INTRAMUSCULAR | Status: AC
  Filled 2018-11-05: qty 250

## 2018-11-05 MED ORDER — AZITHROMYCIN 250 MG PO TABS
1000.0000 mg | ORAL_TABLET | Freq: Once | ORAL | Status: AC
  Administered 2018-11-05: 1000 mg via ORAL

## 2018-11-05 MED ORDER — AZITHROMYCIN 250 MG PO TABS
ORAL_TABLET | ORAL | Status: AC
  Filled 2018-11-05: qty 4

## 2018-11-05 NOTE — Discharge Instructions (Addendum)
Treating you for STDs today. We will send a swab for testing and call you with any positive results. Also testing for HIV and syphilis today.

## 2018-11-05 NOTE — ED Triage Notes (Signed)
Pt states he has been burning while voiding. this has been going on for about a month. Pt states he was [facility] and he had sex before the meds had time to work per the pt.

## 2018-11-06 LAB — CYTOLOGY, (ORAL, ANAL, URETHRAL) ANCILLARY ONLY
Chlamydia: NEGATIVE
Neisseria Gonorrhea: NEGATIVE
Trichomonas: NEGATIVE

## 2018-11-06 LAB — RPR: RPR Ser Ql: NONREACTIVE

## 2018-11-07 NOTE — ED Provider Notes (Signed)
[place]    CSN: [id number] Arrival date & time: 11/05/18  1116      History   Chief Complaint Chief Complaint  Patient presents with  . SEXUALLY TRANSMITTED DISEASE    HPI Jerome Irwin is a 38 y.o. male.    Dysuria Presenting symptoms: dysuria   Presenting symptoms: no penile discharge, no penile pain, no scrotal pain and no swelling   Context: after urination and spontaneously   Relieved by:  Nothing Worsened by:  Nothing Ineffective treatments:  None tried Associated symptoms: no abdominal pain, no diarrhea, no fever, no flank pain, no genital itching, no genital lesions, no genital rash, no groin pain, no hematuria, no nausea, no penile redness, no penile swelling, no priapism, no scrotal swelling, no urinary frequency, no urinary hesitation, no urinary incontinence, no urinary retention and no vomiting   Risk factors: multiple sexual partners, recent infection, recent sexual activity, STI exposure and unprotected sex   Risk factors: no bladder surgery, no change in medication, no erectile dysfunction, no foreign body, no HIV, no kidney stones, no new sexual partner, no sickle cell disease and no urinary catheter     Past Medical History:  Diagnosis Date  . Asthma   . Bronchitis   . Pneumonia   . Ulcerative colitis ([place])     There are no active problems to display for this patient.   Past Surgical History:  Procedure Laterality Date  . COLONOSCOPY WITH PROPOFOL N/A 02/21/2016   Procedure: COLONOSCOPY WITH PROPOFOL;  Surgeon: Arta Silence, MD;  Location: [facility];  Service: Endoscopy;  Laterality: N/A;       Home Medications    Prior to Admission medications   Medication Sig Start Date End Date Taking? Authorizing Provider  albuterol (PROVENTIL HFA;VENTOLIN HFA) 108 (90 Base) MCG/ACT inhaler Inhale 1-2 puffs into the lungs every 6 (six) hours as needed for wheezing or shortness of breath. 09/06/17   Wieters, Hallie C, PA-C  albuterol  (PROVENTIL) (2.5 MG/3ML) 0.083% nebulizer solution Take 3 mLs (2.5 mg total) by nebulization every 6 (six) hours as needed for wheezing or shortness of breath. 10/22/18   Lamptey, Myrene Galas, MD  cyclobenzaprine (FLEXERIL) 5 MG tablet Take 1 tablet (5 mg total) by mouth 3 (three) times daily as needed for muscle spasms. 10/29/18   Jaynee Eagles, PA-C  fluticasone (FLONASE) 50 MCG/ACT nasal spray Place 1 spray into both nostrils daily as needed for allergies or rhinitis. 08/30/18   Langston Masker B, PA-C  Fluticasone-Salmeterol (ADVAIR) 100-50 MCG/DOSE AEPB Inhale 1 puff into the lungs 2 (two) times daily. 09/06/17   Wieters, Hallie C, PA-C  Multiple Vitamin (MULTIVITAMIN) tablet Take 1 tablet by mouth daily.    [provider]  sulfaSALAzine (AZULFIDINE) 500 MG tablet Take 1,000 mg by mouth 3 (three) times daily.     [provider]  cetirizine (ZYRTEC) 5 MG tablet Take 2 tablets (10 mg total) by mouth daily. 08/30/18 10/29/18  Langston Masker B, PA-C  ferrous sulfate 325 (65 FE) MG EC tablet Take 325 mg by mouth 3 (three) times daily with meals.  10/29/18  [provider]  ipratropium-albuterol (DUONEB) 0.5-2.5 (3) MG/3ML SOLN Take 3 mLs by nebulization every 4 (four) hours as needed. 10/22/18 10/22/18  Chase Picket, MD    Family History Family History  Problem Relation Age of Onset  . Healthy Mother   . Healthy Father     Social History Social History   Tobacco Use  . Smoking  status: Former Smoker    Packs/day: 0.00  . Smokeless tobacco: Never Used  Substance Use Topics  . Alcohol use: Not Currently    Comment: former  . Drug use: Yes    Types: Cocaine, Marijuana    Comment: last used cocaine today     Allergies   Patient has no known allergies.   Review of Systems Review of Systems  Constitutional: Negative for fever.  Gastrointestinal: Negative for abdominal pain, diarrhea, nausea and vomiting.  Genitourinary: Positive for dysuria. Negative for bladder  incontinence, discharge, flank pain, frequency, hematuria, hesitancy, penile pain, penile swelling and scrotal swelling.     Physical Exam Triage Vital Signs ED Triage Vitals  Enc Vitals Group     BP 11/05/18 1131 113/65     Pulse --      Resp 11/05/18 1131 18     Temp 11/05/18 1131 97.8 F (36.6 C)     Temp Source 11/05/18 1131 Oral     SpO2 11/05/18 1131 100 %     Weight 11/05/18 1129 200 lb (90.7 kg)     Height --      Head Circumference --      Peak Flow --      Pain Score 11/05/18 1129 5     Pain Loc --      Pain Edu? --      Excl. in [place]? --    No data found.  Updated Vital Signs BP 113/65 (BP Location: Right Arm)   Temp 97.8 F (36.6 C) (Oral)   Resp 18   Wt 200 lb (90.7 kg)   SpO2 100%   BMI 28.70 kg/m   Visual Acuity Right Eye Distance:   Left Eye Distance:   Bilateral Distance:    Right Eye Near:   Left Eye Near:    Bilateral Near:     Physical Exam Vitals signs and nursing note reviewed.  Constitutional:      Appearance: Normal appearance.  HENT:     Head: Normocephalic and atraumatic.     Nose: Nose normal.  Eyes:     Conjunctiva/sclera: Conjunctivae normal.  Neck:     Musculoskeletal: Normal range of motion.  Pulmonary:     Effort: Pulmonary effort is normal.  Abdominal:     Palpations: Abdomen is soft.     Tenderness: There is no abdominal tenderness.  Musculoskeletal: Normal range of motion.  Skin:    General: Skin is warm and dry.  Neurological:     Mental Status: He is alert.  Psychiatric:        Mood and Affect: Mood normal.      UC Treatments / Results  Labs (all labs ordered are listed, but only abnormal results are displayed) Labs Reviewed  RAPID HIV SCREEN (HIV 1/2 AB+AG)  RPR  CYTOLOGY, (ORAL, ANAL, URETHRAL) ANCILLARY ONLY    EKG   Radiology No results found.  Procedures Procedures (including critical care time)  Medications Ordered in UC Medications  cefTRIAXone (ROCEPHIN) injection 250 mg (250 mg  Intramuscular Given 11/05/18 1225)  azithromycin (ZITHROMAX) tablet 1,000 mg (1,000 mg Oral Given 11/05/18 1226)  azithromycin (ZITHROMAX) 250 MG tablet (has no administration in time range)  cefTRIAXone (ROCEPHIN) 250 MG injection (has no administration in time range)    Initial Impression / Assessment and Plan / UC Course  I have reviewed the triage vital signs and the nursing notes.  Pertinent labs & imaging results that were available during my care of the patient  were reviewed by me and considered in my medical decision making (see chart for details).     STD exposure- treating in clinic today for gonorrhea and chlamydia Labs pending.  Final Clinical Impressions(s) / UC Diagnoses   Final diagnoses:  Dysuria     Discharge Instructions     Treating you for STDs today. We will send a swab for testing and call you with any positive results. Also testing for HIV and syphilis today.    ED Prescriptions    None     PDMP not reviewed this encounter.   Loura Halt A, NP 11/07/18 1023

## 2018-11-09 ENCOUNTER — Ambulatory Visit (HOSPITAL_COMMUNITY)
Admission: EM | Admit: 2018-11-09 | Discharge: 2018-11-09 | Disposition: A | Discharge status: Home / Self Care | Payer: Self-pay | Attending: Family Medicine | Admitting: Family Medicine

## 2018-11-09 ENCOUNTER — Encounter (HOSPITAL_COMMUNITY): Payer: Self-pay | Admitting: Family Medicine

## 2018-11-09 ENCOUNTER — Other Ambulatory Visit: Payer: Self-pay

## 2018-11-09 DIAGNOSIS — R1013 Epigastric pain: Secondary | ICD-10-CM

## 2018-11-09 MED ORDER — PREDNISONE 50 MG PO TABS: ORAL_TABLET | ORAL | 0 refills | Status: DC

## 2018-11-09 NOTE — Discharge Instructions (Addendum)
If stomach symptoms persist, please return for follow-up

## 2018-11-09 NOTE — ED Provider Notes (Signed)
[facility]    CSN: [id number] Arrival date & time: 11/09/18  1441      History   Chief Complaint Chief Complaint  Patient presents with  . Abdominal Pain    HPI Jerome Irwin is a 38 y.o. male.   [name] who has been seen several times in the last 10 days at this facility.  He complained about abdominal pain.  He was recently checked for STDs and all his tests were negative including HIV, RPR, and urethral swab.  Patient has a history of ulcerative colitis and thinks it may be the recent antibiotic that he took care is upset his stomach.  He has had no fever or blood in his stools, and is staying on his sulfasalazine.  Patient continues to smoke and has had some congestion recently without significant shortness of breath.  He is trying to quit smoking.     Past Medical History:  Diagnosis Date  . Asthma   . Bronchitis   . Pneumonia   . Ulcerative colitis ([place])     There are no active problems to display for this patient.   Past Surgical History:  Procedure Laterality Date  . COLONOSCOPY WITH PROPOFOL N/A 02/21/2016   Procedure: COLONOSCOPY WITH PROPOFOL;  Surgeon: Arta Silence, MD;  Location: [facility];  Service: Endoscopy;  Laterality: N/A;       Home Medications    Prior to Admission medications   Medication Sig Start Date End Date Taking? Authorizing Provider  albuterol (PROVENTIL HFA;VENTOLIN HFA) 108 (90 Base) MCG/ACT inhaler Inhale 1-2 puffs into the lungs every 6 (six) hours as needed for wheezing or shortness of breath. 09/06/17   Wieters, Hallie C, PA-C  albuterol (PROVENTIL) (2.5 MG/3ML) 0.083% nebulizer solution Take 3 mLs (2.5 mg total) by nebulization every 6 (six) hours as needed for wheezing or shortness of breath. 10/22/18   Chase Picket, MD  fluticasone (FLONASE) 50 MCG/ACT nasal spray Place 1 spray into both nostrils daily as needed for allergies or rhinitis. 08/30/18   Langston Masker B, PA-C   Fluticasone-Salmeterol (ADVAIR) 100-50 MCG/DOSE AEPB Inhale 1 puff into the lungs 2 (two) times daily. 09/06/17   Wieters, Hallie C, PA-C  Multiple Vitamin (MULTIVITAMIN) tablet Take 1 tablet by mouth daily.    [provider]  predniSONE (DELTASONE) 50 MG tablet One daily with food 11/09/18   Robyn Haber, MD  sulfaSALAzine (AZULFIDINE) 500 MG tablet Take 1,000 mg by mouth 3 (three) times daily.     [provider]  cetirizine (ZYRTEC) 5 MG tablet Take 2 tablets (10 mg total) by mouth daily. 08/30/18 10/29/18  Langston Masker B, PA-C  ferrous sulfate 325 (65 FE) MG EC tablet Take 325 mg by mouth 3 (three) times daily with meals.  10/29/18  [provider]  ipratropium-albuterol (DUONEB) 0.5-2.5 (3) MG/3ML SOLN Take 3 mLs by nebulization every 4 (four) hours as needed. 10/22/18 10/22/18  Chase Picket, MD    Family History Family History  Problem Relation Age of Onset  . Healthy Mother   . Healthy Father     Social History Social History   Tobacco Use  . Smoking status: Former Smoker    Packs/day: 0.00  . Smokeless tobacco: Never Used  Substance Use Topics  . Alcohol use: Not Currently    Comment: former  . Drug use: Yes    Types: Cocaine, Marijuana    Comment: last used cocaine today     Allergies  Patient has no known allergies.   Review of Systems Review of Systems  HENT: Positive for congestion.   Gastrointestinal: Positive for abdominal pain.  All other systems reviewed and are negative.    Physical Exam Triage Vital Signs ED Triage Vitals [11/09/18 1523]  Enc Vitals Group     BP 108/77     Pulse Rate 88     Resp 16     Temp 97.8 F (36.6 C)     Temp Source Temporal     SpO2 96 %     Weight      Height      Head Circumference      Peak Flow      Pain Score      Pain Loc      Pain Edu?      Excl. in [place]?    No data found.  Updated Vital Signs BP 108/77 (BP Location: Left Arm)   Pulse 88   Temp 97.8 F (36.6 C)  (Temporal)   Resp 16   SpO2 96%     Physical Exam Vitals signs and nursing note reviewed.  Constitutional:      Appearance: He is well-developed and normal weight.  HENT:     Head: Normocephalic.     Mouth/Throat:     Mouth: Mucous membranes are moist.  Cardiovascular:     Rate and Rhythm: Normal rate and regular rhythm.  Pulmonary:     Effort: Pulmonary effort is normal.     Breath sounds: Normal breath sounds.  Abdominal:     General: Abdomen is protuberant.     Palpations: Abdomen is soft.     Tenderness: There is abdominal tenderness in the epigastric area.  Skin:    General: Skin is warm and dry.  Neurological:     General: No focal deficit present.     Mental Status: He is alert.  Psychiatric:        Mood and Affect: Mood normal.        Behavior: Behavior normal.      UC Treatments / Results  Labs (all labs ordered are listed, but only abnormal results are displayed) Labs Reviewed - No data to display  EKG   Radiology No results found.  Procedures Procedures (including critical care time)  Medications Ordered in UC Medications - No data to display  Initial Impression / Assessment and Plan / UC Course  I have reviewed the triage vital signs and the nursing notes.  Pertinent labs & imaging results that were available during my care of the patient were reviewed by me and considered in my medical decision making (see chart for details).    Final Clinical Impressions(s) / UC Diagnoses   Final diagnoses:  Epigastric pain     Discharge Instructions     If stomach symptoms persist, please return for follow-up    ED Prescriptions    Medication Sig Dispense Auth. Provider   predniSONE (DELTASONE) 50 MG tablet One daily with food 3 tablet Robyn Haber, MD     I have reviewed the PDMP during this encounter.   Robyn Haber, MD 11/09/18 1537

## 2018-11-09 NOTE — ED Triage Notes (Signed)
Pt present abdominal pain, symptoms been going on for a few days.

## 2018-11-13 ENCOUNTER — Ambulatory Visit (HOSPITAL_COMMUNITY)
Admission: EM | Admit: 2018-11-13 | Discharge: 2018-11-13 | Disposition: A | Discharge status: Home / Self Care | Payer: Self-pay | Attending: Family Medicine | Admitting: Family Medicine

## 2018-11-13 ENCOUNTER — Encounter (HOSPITAL_COMMUNITY): Payer: Self-pay

## 2018-11-13 ENCOUNTER — Other Ambulatory Visit: Payer: Self-pay

## 2018-11-13 DIAGNOSIS — J4530 Mild persistent asthma, uncomplicated: Secondary | ICD-10-CM | POA: Insufficient documentation

## 2018-11-13 MED ORDER — BUDESONIDE 90 MCG/ACT IN AEPB: 2.0000 | INHALATION_SPRAY | Freq: Two times a day (BID) | RESPIRATORY_TRACT | 0 refills | Status: DC

## 2018-11-13 NOTE — ED Triage Notes (Signed)
Pt states he is having back pain  chest tightness on and off x 1 week. Pt states the chest tightness is worse when he smoke marihuana.

## 2018-11-13 NOTE — ED Provider Notes (Signed)
Jerome Irwin    CSN: [id number] Arrival date & time: 11/13/18  1245      History   Chief Complaint Chief Complaint  Patient presents with  . Shortness of Breath  . Back Pain  . Chest Pain    HPI Jerome Irwin is a 38 y.o. male.   Naresh Althaus presents with complaints of recurrent back pain, shortness of breath sensation. Has been seen multiple times for this, last on 10/3 with EKG and chest xray which were normal. Has also been to the ER for this. History of asthma, bronchitis and UC. Doesn't follow with a PCP. His symptoms are triggered by smoking marijuana, which he did do today. Also endorses inhaling cocaine basically daily, which he did do today. No chest pain . Some back pain. Intermittent shortness of breath . No cough. Some congestion. No fevers. States he has not wanted to seek treatment for his substance abuse. Resides with family. Got out of jail approximately 2 months ago and is not working currently. States his nebulizers help some. He was given steroids recently which he states always help with his symptoms but symptoms return once he stops. He has been prescribed a daily inhaler in the past but states the cost was too high so he no longer uses.    ROS per HPI, negative if not otherwise mentioned.      Past Medical History:  Diagnosis Date  . Asthma   . Bronchitis   . Pneumonia   . Ulcerative colitis ([place])     There are no active problems to display for this patient.   Past Surgical History:  Procedure Laterality Date  . COLONOSCOPY WITH PROPOFOL N/A 02/21/2016   Procedure: COLONOSCOPY WITH PROPOFOL;  Surgeon: Arta Silence, MD;  Location: [facility];  Service: Endoscopy;  Laterality: N/A;       Home Medications    Prior to Admission medications   Medication Sig Start Date End Date Taking? Authorizing Provider  albuterol (PROVENTIL HFA;VENTOLIN HFA) 108 (90 Base) MCG/ACT inhaler Inhale 1-2 puffs into the lungs every 6 (six) hours  as needed for wheezing or shortness of breath. 09/06/17   Wieters, Hallie C, PA-C  albuterol (PROVENTIL) (2.5 MG/3ML) 0.083% nebulizer solution Take 3 mLs (2.5 mg total) by nebulization every 6 (six) hours as needed for wheezing or shortness of breath. 10/22/18   Chase Picket, MD  Budesonide 90 MCG/ACT inhaler Inhale 2 puffs into the lungs 2 (two) times daily. 11/13/18   Zigmund Gottron, NP  fluticasone (FLONASE) 50 MCG/ACT nasal spray Place 1 spray into both nostrils daily as needed for allergies or rhinitis. 08/30/18   Langston Masker B, PA-C  Fluticasone-Salmeterol (ADVAIR) 100-50 MCG/DOSE AEPB Inhale 1 puff into the lungs 2 (two) times daily. 09/06/17   Wieters, Hallie C, PA-C  Multiple Vitamin (MULTIVITAMIN) tablet Take 1 tablet by mouth daily.    [provider]  predniSONE (DELTASONE) 50 MG tablet One daily with food 11/09/18   Robyn Haber, MD  sulfaSALAzine (AZULFIDINE) 500 MG tablet Take 1,000 mg by mouth 3 (three) times daily.     [provider]  cetirizine (ZYRTEC) 5 MG tablet Take 2 tablets (10 mg total) by mouth daily. 08/30/18 10/29/18  Langston Masker B, PA-C  ferrous sulfate 325 (65 FE) MG EC tablet Take 325 mg by mouth 3 (three) times daily with meals.  10/29/18  [provider]  ipratropium-albuterol (DUONEB) 0.5-2.5 (3) MG/3ML SOLN Take 3 mLs by nebulization every 4 (four) hours  as needed. 10/22/18 10/22/18  Chase Picket, MD    Family History Family History  Problem Relation Age of Onset  . Healthy Mother   . Healthy Father     Social History Social History   Tobacco Use  . Smoking status: Former Smoker    Packs/day: 0.00  . Smokeless tobacco: Never Used  Substance Use Topics  . Alcohol use: Not Currently    Comment: former  . Drug use: Yes    Types: Cocaine, Marijuana    Comment: last used cocaine today     Allergies   Patient has no known allergies.   Review of Systems Review of Systems   Physical Exam Triage Vital Signs  ED Triage Vitals  Enc Vitals Group     BP 11/13/18 1330 116/78     Pulse Rate 11/13/18 1330 82     Resp 11/13/18 1330 15     Temp 11/13/18 1330 98.1 F (36.7 C)     Temp Source 11/13/18 1330 Oral     SpO2 11/13/18 1330 98 %     Weight --      Height --      Head Circumference --      Peak Flow --      Pain Score 11/13/18 1327 6     Pain Loc --      Pain Edu? --      Excl. in [place]? --    No data found.  Updated Vital Signs BP 116/78 (BP Location: Left Arm)   Pulse 82   Temp 98.1 F (36.7 C) (Oral)   Resp 15   SpO2 98%    Physical Exam Constitutional:      Appearance: He is well-developed.  Cardiovascular:     Rate and Rhythm: Normal rate and regular rhythm.  Pulmonary:     Effort: Pulmonary effort is normal.     Breath sounds: Normal breath sounds. No decreased breath sounds.  Skin:    General: Skin is warm and dry.  Neurological:     Mental Status: He is alert and oriented to person, place, and time.    Ekg without acute changes today as interpreted by me.   UC Treatments / Results  Labs (all labs ordered are listed, but only abnormal results are displayed) Labs Reviewed - No data to display  EKG   Radiology No results found.  Procedures Procedures (including critical care time)  Medications Ordered in UC Medications - No data to display  Initial Impression / Assessment and Plan / UC Course  I have reviewed the triage vital signs and the nursing notes.  Pertinent labs & imaging results that were available during my care of the patient were reviewed by me and considered in my medical decision making (see chart for details).     Non toxic. Benign physical exam.  Discussed treatment options and plan of care at length with patient, as he has been seen repetitively for similar complaints. Received oral prednisone 10/12. Will try a daily maintenance inhaler, on goodrx it appears that Pulmicort is the most affordable. Encouraged establish with PCP.    Final Clinical Impressions(s) / UC Diagnoses   Final diagnoses:  Mild persistent asthma without complication     Discharge Instructions     We will try a daily inhaler, two puffs twice a day, every day.  May continue with your nebulizer's as needed for wheezing or shortness of breath .  I do recommend establishing with a primary care  provider for long term management of your crohn's and asthma.  I do recommend decreasing to quit smoking and use of drugs to decrease symptoms.     ED Prescriptions    Medication Sig Dispense Auth. Provider   Budesonide 90 MCG/ACT inhaler Inhale 2 puffs into the lungs 2 (two) times daily. 1 each Zigmund Gottron, NP     PDMP not reviewed this encounter.   Zigmund Gottron, NP 11/13/18 1424

## 2018-11-13 NOTE — Discharge Instructions (Signed)
We will try a daily inhaler, two puffs twice a day, every day.  May continue with your nebulizer's as needed for wheezing or shortness of breath .  I do recommend establishing with a primary care provider for long term management of your crohn's and asthma.  I do recommend decreasing to quit smoking and use of drugs to decrease symptoms.

## 2018-11-20 ENCOUNTER — Encounter (HOSPITAL_COMMUNITY): Payer: Self-pay

## 2018-11-20 ENCOUNTER — Emergency Department (HOSPITAL_COMMUNITY)
Admission: EM | Admit: 2018-11-20 | Discharge: 2018-11-20 | Disposition: A | Discharge status: Home / Self Care | Payer: Self-pay | Attending: Emergency Medicine | Admitting: Emergency Medicine

## 2018-11-20 ENCOUNTER — Other Ambulatory Visit: Payer: Self-pay

## 2018-11-20 ENCOUNTER — Emergency Department (HOSPITAL_COMMUNITY): Payer: Self-pay

## 2018-11-20 DIAGNOSIS — J45909 Unspecified asthma, uncomplicated: Secondary | ICD-10-CM | POA: Insufficient documentation

## 2018-11-20 DIAGNOSIS — Z79899 Other long term (current) drug therapy: Secondary | ICD-10-CM | POA: Insufficient documentation

## 2018-11-20 DIAGNOSIS — Z87891 Personal history of nicotine dependence: Secondary | ICD-10-CM | POA: Insufficient documentation

## 2018-11-20 DIAGNOSIS — F1410 Cocaine abuse, uncomplicated: Secondary | ICD-10-CM | POA: Insufficient documentation

## 2018-11-20 DIAGNOSIS — J4521 Mild intermittent asthma with (acute) exacerbation: Secondary | ICD-10-CM | POA: Insufficient documentation

## 2018-11-20 DIAGNOSIS — F1210 Cannabis abuse, uncomplicated: Secondary | ICD-10-CM | POA: Insufficient documentation

## 2018-11-20 NOTE — ED Triage Notes (Signed)
Pt states that he feels SOB after he smokes and smokes everyday. Denies using inhaler when feels SOB will only use his nebulizer.

## 2018-11-20 NOTE — Discharge Instructions (Signed)
Follow-up with your primary care doctor as we discussed to help manage your asthma.  The steroid inhaler prescription you were given by the urgent care should help with your wheezing.  Please try to fill that when you are able.  Try to quit smoking.

## 2018-11-20 NOTE — ED Provider Notes (Signed)
[facility] DEPT Provider Note   CSN: [id number] Arrival date & time: 11/20/18  8938     History   Chief Complaint Chief Complaint  Patient presents with  . Shortness of Breath    HPI Jerome Irwin is a 38 y.o. male.     HPI Patient presents ED for evaluation of chest tightness and congestion.  Patient was having symptoms last week.  This has actually been a recurrent issue and has been seen prior to that as well.  He went to the urgent care.  Patient had an evaluation.  They felt his symptoms were related to asthma.  They recommended Pulmicort and establishing care with a primary care doctor.  Patient states he had another episode today.  He has been having some nasal congestion but no fevers or significant cough.  Patient smoked marijuana this morning and after doing that he started to feel short of breath.  Patient states he does smoke regularly and this often triggers his breathing issues.  Patient did use his nebulizer machine after the onset of his symptoms and before arriving to the ED  No fevers or chills.  No leg swelling.  No history of PE. Past Medical History:  Diagnosis Date  . Asthma   . Bronchitis   . Pneumonia   . Ulcerative colitis ([place])     There are no active problems to display for this patient.   Past Surgical History:  Procedure Laterality Date  . COLONOSCOPY WITH PROPOFOL N/A 02/21/2016   Procedure: COLONOSCOPY WITH PROPOFOL;  Surgeon: Arta Silence, MD;  Location: [facility];  Service: Endoscopy;  Laterality: N/A;        Home Medications    Prior to Admission medications   Medication Sig Start Date End Date Taking? Authorizing Provider  albuterol (PROVENTIL HFA;VENTOLIN HFA) 108 (90 Base) MCG/ACT inhaler Inhale 1-2 puffs into the lungs every 6 (six) hours as needed for wheezing or shortness of breath. 09/06/17   Wieters, Hallie C, PA-C  albuterol (PROVENTIL) (2.5 MG/3ML) 0.083% nebulizer solution Take 3 mLs  (2.5 mg total) by nebulization every 6 (six) hours as needed for wheezing or shortness of breath. 10/22/18   Chase Picket, MD  Budesonide 90 MCG/ACT inhaler Inhale 2 puffs into the lungs 2 (two) times daily. 11/13/18   Zigmund Gottron, NP  fluticasone (FLONASE) 50 MCG/ACT nasal spray Place 1 spray into both nostrils daily as needed for allergies or rhinitis. 08/30/18   Langston Masker B, PA-C  Fluticasone-Salmeterol (ADVAIR) 100-50 MCG/DOSE AEPB Inhale 1 puff into the lungs 2 (two) times daily. 09/06/17   Wieters, Hallie C, PA-C  Multiple Vitamin (MULTIVITAMIN) tablet Take 1 tablet by mouth daily.    [provider]  predniSONE (DELTASONE) 50 MG tablet One daily with food 11/09/18   Robyn Haber, MD  sulfaSALAzine (AZULFIDINE) 500 MG tablet Take 1,000 mg by mouth 3 (three) times daily.     [provider]  cetirizine (ZYRTEC) 5 MG tablet Take 2 tablets (10 mg total) by mouth daily. 08/30/18 10/29/18  Langston Masker B, PA-C  ferrous sulfate 325 (65 FE) MG EC tablet Take 325 mg by mouth 3 (three) times daily with meals.  10/29/18  [provider]  ipratropium-albuterol (DUONEB) 0.5-2.5 (3) MG/3ML SOLN Take 3 mLs by nebulization every 4 (four) hours as needed. 10/22/18 10/22/18  Chase Picket, MD    Family History Family History  Problem Relation Age of Onset  . Healthy Mother   . Healthy  Father     Social History Social History   Tobacco Use  . Smoking status: Former Smoker    Packs/day: 0.00  . Smokeless tobacco: Never Used  Substance Use Topics  . Alcohol use: Not Currently    Comment: former  . Drug use: Yes    Types: Cocaine, Marijuana    Comment: last used cocaine today     Allergies   Patient has no known allergies.   Review of Systems Review of Systems  All other systems reviewed and are negative.    Physical Exam Updated Vital Signs BP 117/76 (BP Location: Left Arm)   Pulse 81   Temp 98.3 F (36.8 C) (Oral)   Resp 18   Wt 91 kg    SpO2 96%   BMI 28.79 kg/m   Physical Exam Vitals signs and nursing note reviewed.  Constitutional:      General: He is not in acute distress.    Appearance: He is well-developed.  HENT:     Head: Normocephalic and atraumatic.     Right Ear: External ear normal.     Left Ear: External ear normal.  Eyes:     General: No scleral icterus.       Right eye: No discharge.        Left eye: No discharge.     Conjunctiva/sclera: Conjunctivae normal.  Neck:     Musculoskeletal: Neck supple.     Trachea: No tracheal deviation.  Cardiovascular:     Rate and Rhythm: Normal rate and regular rhythm.  Pulmonary:     Effort: Pulmonary effort is normal. No respiratory distress.     Breath sounds: Normal breath sounds. No stridor. No rales.     Comments: Intermittent  faint wheeze auscultated Abdominal:     General: Bowel sounds are normal. There is no distension.     Palpations: Abdomen is soft.     Tenderness: There is no abdominal tenderness. There is no guarding or rebound.  Musculoskeletal:        General: No tenderness.  Skin:    General: Skin is warm and dry.     Findings: No rash.  Neurological:     Mental Status: He is alert.     Cranial Nerves: No cranial nerve deficit (no facial droop, extraocular movements intact, no slurred speech).     Sensory: No sensory deficit.     Motor: No abnormal muscle tone or seizure activity.     Coordination: Coordination normal.      ED Treatments / Results  Labs (all labs ordered are listed, but only abnormal results are displayed) Labs Reviewed - No data to display  EKG EKG Interpretation  Date/Time:  Friday November 20 2018 08:27:13 EDT Ventricular Rate:  87 PR Interval:    QRS Duration: 78 QT Interval:  356 QTC Calculation: 429 R Axis:   39 Text Interpretation:  Sinus rhythm Baseline wander in lead(s) I V2 V3 No significant change since last tracing Confirmed by Dorie Rank [phone]) on 11/20/2018 9:48:44 AM   Radiology Dg  Chest 2 View  Result Date: 11/20/2018 CLINICAL DATA:  Shortness of breath. EXAM: CHEST - 2 VIEW COMPARISON:  10/31/2018. FINDINGS: Mediastinum hilar structures normal. Heart size normal. Mild bibasilar subsegmental atelectasis. No pleural effusion or pneumothorax. IMPRESSION: Mild bibasilar subsegmental atelectasis. Exam otherwise unremarkable. Electronically Signed   By: Marcello Moores  Register   On: 11/20/2018 09:36    Procedures Procedures (including critical care time)  Medications Ordered in ED Medications -  No data to display   Initial Impression / Assessment and Plan / ED Course  I have reviewed the triage vital signs and the nursing notes.  Pertinent labs & imaging results that were available during my care of the patient were reviewed by me and considered in my medical decision making (see chart for details).  Clinical Course as of Nov 19 1008  Fri Nov 20, 2018  9977 Patient offered albuterol treatment.  He does not feel like he needs one now.   [JK]  [id number] Chest x-ray without acute findings.   [JK]    Clinical Course User Index [JK] Dorie Rank, MD     Patient presented to ED with complaints of recurrent shortness of breath.  On exam the patient was breathing easily.  He had a very faint sporadic wheeze but no retractions no hypoxia.  No symptoms concerning for DVT PE or pneumonia.  Chest x-ray is unremarkable.  Patient was given a prescription for steroid inhaler recently.  He has not been taking any of those medications because of cost.  I recommended trying to follow-up with the primary care doctor and I will give him referral to the [facility].  They may be able to help him with his medications.  I also encouraged the patient to stop smoking.  He should try to get the steroid inhaler filled when he is able to.  At this time there does not appear to be any evidence of an acute emergency medical condition and the patient appears stable for discharge with  appropriate outpatient follow up.  Final Clinical Impressions(s) / ED Diagnoses   Final diagnoses:  Mild intermittent asthma with exacerbation    ED Discharge Orders    None       Dorie Rank, MD 11/20/18 1011

## 2018-11-20 NOTE — ED Triage Notes (Addendum)
Pt states he is returning today with same complaints as 10/16. Pt c/o chest tightness and SHOB. Pt states no relief with home nebulizer. Pt in NAD

## 2018-11-20 NOTE — ED Notes (Signed)
Patient transported to X-ray 

## 2018-11-24 ENCOUNTER — Ambulatory Visit (HOSPITAL_COMMUNITY)
Admission: EM | Admit: 2018-11-24 | Discharge: 2018-11-24 | Disposition: A | Discharge status: Home / Self Care | Payer: Self-pay | Attending: Emergency Medicine | Admitting: Emergency Medicine

## 2018-11-24 ENCOUNTER — Other Ambulatory Visit: Payer: Self-pay

## 2018-11-24 ENCOUNTER — Encounter (HOSPITAL_COMMUNITY): Payer: Self-pay

## 2018-11-24 DIAGNOSIS — Z5321 Procedure and treatment not carried out due to patient leaving prior to being seen by health care provider: Secondary | ICD-10-CM

## 2018-11-24 NOTE — ED Triage Notes (Signed)
Pt states he has a burning sensation in his penis.x 2 weeks Pt states he wants to be tested for STDs.

## 2018-11-24 NOTE — ED Notes (Signed)
Patient left, stated he had to go to work and might come back later.

## 2018-11-27 ENCOUNTER — Other Ambulatory Visit: Payer: Self-pay

## 2018-11-27 ENCOUNTER — Ambulatory Visit (HOSPITAL_COMMUNITY)
Admission: EM | Admit: 2018-11-27 | Discharge: 2018-11-27 | Disposition: A | Discharge status: Home / Self Care | Payer: Self-pay | Attending: Family Medicine | Admitting: Family Medicine

## 2018-11-27 ENCOUNTER — Encounter (HOSPITAL_COMMUNITY): Payer: Self-pay

## 2018-11-27 DIAGNOSIS — Z202 Contact with and (suspected) exposure to infections with a predominantly sexual mode of transmission: Secondary | ICD-10-CM

## 2018-11-27 DIAGNOSIS — R369 Urethral discharge, unspecified: Secondary | ICD-10-CM | POA: Insufficient documentation

## 2018-11-27 DIAGNOSIS — R1013 Epigastric pain: Secondary | ICD-10-CM | POA: Insufficient documentation

## 2018-11-27 DIAGNOSIS — Z113 Encounter for screening for infections with a predominantly sexual mode of transmission: Secondary | ICD-10-CM

## 2018-11-27 DIAGNOSIS — Z7251 High risk heterosexual behavior: Secondary | ICD-10-CM | POA: Insufficient documentation

## 2018-11-27 MED ORDER — OMEPRAZOLE 20 MG PO CPDR: 20.0000 mg | DELAYED_RELEASE_CAPSULE | Freq: Every day | ORAL | 0 refills | Status: DC

## 2018-11-27 NOTE — ED Provider Notes (Signed)
[place]    CSN: [id number] Arrival date & time: 11/27/18  1205      History   Chief Complaint Chief Complaint  Patient presents with  . Abdominal Pain  . STD testing    HPI Jerome Irwin is a 38 y.o. male.   HPI  Patient is here for 2 reasons.  First he says he has abdominal pain.  He thinks it might be his ulcerative colitis flaring up.  No fever.  No diarrhea.  He does have occasional blood in bowels but that is normal for him.  He is taking his sulfasalazine.  No change in appetite.  No nausea or vomiting. He also would like to be checked for STDs.  He has penile discharge.  He has more than 1 sexual partner.  He does not use condoms.  No known specific exposure.  He had STD testing on October 8.  HIV and RPR were negative.  Chlamydia/gonorrhea/trichomonas also negative  Past Medical History:  Diagnosis Date  . Asthma   . Bronchitis   . Pneumonia   . Ulcerative colitis ([place])     There are no active problems to display for this patient.   Past Surgical History:  Procedure Laterality Date  . COLONOSCOPY WITH PROPOFOL N/A 02/21/2016   Procedure: COLONOSCOPY WITH PROPOFOL;  Surgeon: Arta Silence, MD;  Location: [facility];  Service: Endoscopy;  Laterality: N/A;       Home Medications    Prior to Admission medications   Medication Sig Start Date End Date Taking? Authorizing Provider  albuterol (PROVENTIL HFA;VENTOLIN HFA) 108 (90 Base) MCG/ACT inhaler Inhale 1-2 puffs into the lungs every 6 (six) hours as needed for wheezing or shortness of breath. 09/06/17   Wieters, Hallie C, PA-C  albuterol (PROVENTIL) (2.5 MG/3ML) 0.083% nebulizer solution Take 3 mLs (2.5 mg total) by nebulization every 6 (six) hours as needed for wheezing or shortness of breath. 10/22/18   Chase Picket, MD  Budesonide 90 MCG/ACT inhaler Inhale 2 puffs into the lungs 2 (two) times daily. 11/13/18   Zigmund Gottron, NP  fluticasone (FLONASE) 50 MCG/ACT nasal spray Place 1  spray into both nostrils daily as needed for allergies or rhinitis. 08/30/18   Langston Masker B, PA-C  Fluticasone-Salmeterol (ADVAIR) 100-50 MCG/DOSE AEPB Inhale 1 puff into the lungs 2 (two) times daily. 09/06/17   Wieters, Hallie C, PA-C  Multiple Vitamin (MULTIVITAMIN) tablet Take 1 tablet by mouth daily.    [provider]  omeprazole (PRILOSEC) 20 MG capsule Take 1 capsule (20 mg total) by mouth daily. 11/27/18   Raylene Everts, MD  sulfaSALAzine (AZULFIDINE) 500 MG tablet Take 1,000 mg by mouth 3 (three) times daily.     [provider]  cetirizine (ZYRTEC) 5 MG tablet Take 2 tablets (10 mg total) by mouth daily. 08/30/18 10/29/18  Langston Masker B, PA-C  ferrous sulfate 325 (65 FE) MG EC tablet Take 325 mg by mouth 3 (three) times daily with meals.  10/29/18  [provider]  ipratropium-albuterol (DUONEB) 0.5-2.5 (3) MG/3ML SOLN Take 3 mLs by nebulization every 4 (four) hours as needed. 10/22/18 10/22/18  Chase Picket, MD    Family History Family History  Problem Relation Age of Onset  . Healthy Mother   . Healthy Father     Social History Social History   Tobacco Use  . Smoking status: Former Smoker    Packs/day: 0.00  . Smokeless tobacco: Never Used  Substance Use Topics  .  Alcohol use: Not Currently    Comment: former  . Drug use: Yes    Types: Cocaine, Marijuana    Comment: last used cocaine today     Allergies   Patient has no known allergies.   Review of Systems Review of Systems  Constitutional: Negative for chills and fever.  HENT: Negative for ear pain and sore throat.   Eyes: Negative for pain and visual disturbance.  Respiratory: Negative for cough and shortness of breath.   Cardiovascular: Negative for chest pain and palpitations.  Gastrointestinal: Positive for abdominal pain and blood in stool. Negative for diarrhea, nausea, rectal pain and vomiting.  Genitourinary: Positive for discharge. Negative for dysuria and  hematuria.  Musculoskeletal: Negative for arthralgias and back pain.  Skin: Negative for color change and rash.  Neurological: Negative for seizures and syncope.  All other systems reviewed and are negative.    Physical Exam Triage Vital Signs ED Triage Vitals  Enc Vitals Group     BP 11/27/18 1245 122/87     Pulse Rate 11/27/18 1245 94     Resp 11/27/18 1245 18     Temp 11/27/18 1245 98.8 F (37.1 C)     Temp Source 11/27/18 1245 Oral     SpO2 11/27/18 1245 97 %     Weight --      Height --      Head Circumference --      Peak Flow --      Pain Score 11/27/18 1247 5     Pain Loc --      Pain Edu? --      Excl. in [place]? --    No data found.  Updated Vital Signs BP 122/87 (BP Location: Left Arm)   Pulse 94   Temp 98.8 F (37.1 C) (Oral)   Resp 18   SpO2 97%  :     Physical Exam Constitutional:      General: He is not in acute distress.    Appearance: He is well-developed.  HENT:     Head: Normocephalic and atraumatic.  Eyes:     Conjunctiva/sclera: Conjunctivae normal.     Pupils: Pupils are equal, round, and reactive to light.  Neck:     Musculoskeletal: Normal range of motion.  Cardiovascular:     Rate and Rhythm: Normal rate.  Pulmonary:     Effort: Pulmonary effort is normal. No respiratory distress.  Abdominal:     General: Bowel sounds are normal. There is no distension.     Palpations: Abdomen is soft.     Tenderness: There is abdominal tenderness in the epigastric area.     Comments: Mild tenderness to palpation of the epigastrium.  No tenderness over the remainder of the abdomen or colon  Genitourinary:    Penis: Normal.      Comments: Circumcised penis.  No discharge noted. Musculoskeletal: Normal range of motion.  Skin:    General: Skin is warm and dry.  Neurological:     Mental Status: He is alert.  Psychiatric:        Mood and Affect: Mood normal.        Behavior: Behavior normal.      UC Treatments / Results  Labs (all labs  ordered are listed, but only abnormal results are displayed) Labs Reviewed  CYTOLOGY, (ORAL, ANAL, URETHRAL) ANCILLARY ONLY    EKG   Radiology No results found.  Procedures Procedures (including critical care time)  Medications Ordered in UC Medications -  No data to display  Initial Impression / Assessment and Plan / UC Course  I have reviewed the triage vital signs and the nursing notes.  Pertinent labs & imaging results that were available during my care of the patient were reviewed by me and considered in my medical decision making (see chart for details).     Safe sex discussed Final Clinical Impressions(s) / UC Diagnoses   Final diagnoses:  Abdominal pain, epigastric  Penile discharge  High risk heterosexual behavior     Discharge Instructions     Take the omeprazole daily This will reduce stomach acid Safe sex is recomended   ED Prescriptions    Medication Sig Dispense Auth. Provider   omeprazole (PRILOSEC) 20 MG capsule Take 1 capsule (20 mg total) by mouth daily. 30 capsule Raylene Everts, MD     PDMP not reviewed this encounter.   Raylene Everts, MD 11/27/18 [phone]

## 2018-11-27 NOTE — Discharge Instructions (Addendum)
Take the omeprazole daily This will reduce stomach acid Safe sex is recomended

## 2018-11-27 NOTE — ED Triage Notes (Signed)
Pt presents with generalized abdominal discomfort that he believes to be related to his ulcerative colitis.  Pt would also like to get STD tested.

## 2018-11-30 LAB — CYTOLOGY, (ORAL, ANAL, URETHRAL) ANCILLARY ONLY
Chlamydia: NEGATIVE
Neisseria Gonorrhea: NEGATIVE
Trichomonas: NEGATIVE

## 2018-12-13 ENCOUNTER — Ambulatory Visit (HOSPITAL_COMMUNITY)
Admission: EM | Admit: 2018-12-13 | Discharge: 2018-12-13 | Disposition: A | Discharge status: Home / Self Care | Payer: HRSA Program | Attending: Emergency Medicine | Admitting: Emergency Medicine

## 2018-12-13 ENCOUNTER — Encounter (HOSPITAL_COMMUNITY): Payer: Self-pay

## 2018-12-13 ENCOUNTER — Telehealth (HOSPITAL_COMMUNITY): Payer: Self-pay | Admitting: *Deleted

## 2018-12-13 ENCOUNTER — Other Ambulatory Visit: Payer: Self-pay

## 2018-12-13 DIAGNOSIS — F1290 Cannabis use, unspecified, uncomplicated: Secondary | ICD-10-CM | POA: Diagnosis present

## 2018-12-13 DIAGNOSIS — Z87891 Personal history of nicotine dependence: Secondary | ICD-10-CM | POA: Insufficient documentation

## 2018-12-13 DIAGNOSIS — Z79899 Other long term (current) drug therapy: Secondary | ICD-10-CM | POA: Diagnosis not present

## 2018-12-13 DIAGNOSIS — U071 COVID-19: Secondary | ICD-10-CM | POA: Insufficient documentation

## 2018-12-13 DIAGNOSIS — J4521 Mild intermittent asthma with (acute) exacerbation: Secondary | ICD-10-CM | POA: Diagnosis present

## 2018-12-13 DIAGNOSIS — Z7951 Long term (current) use of inhaled steroids: Secondary | ICD-10-CM | POA: Insufficient documentation

## 2018-12-13 MED ORDER — MONTELUKAST SODIUM 10 MG PO TABS: 10.0000 mg | ORAL_TABLET | Freq: Every day | ORAL | 0 refills | Status: DC

## 2018-12-13 MED ORDER — AEROCHAMBER PLUS FLO-VU MEDIUM MISC
1.0000 | Freq: Once | Status: AC
  Administered 2018-12-13: 1

## 2018-12-13 MED ORDER — AEROCHAMBER PLUS FLO-VU LARGE MISC
Status: AC
  Filled 2018-12-13: qty 1

## 2018-12-13 NOTE — ED Triage Notes (Signed)
Pt present shortness of breath, symptoms started a few days ago. Pt believe its his bronchitis is what causing the shortness of breath.

## 2018-12-13 NOTE — Telephone Encounter (Signed)
Pt inquiring about STD test results from 11/27/2018.  Notified pt his test results are negative for gonorrhea, chlamydia, and trichomonos.  Pt verbalized understanding.

## 2018-12-13 NOTE — ED Provider Notes (Signed)
HPI  SUBJECTIVE:  Jerome Irwin is a 38 y.o. male who presents with intermittent shortness of breath for the past week that is present primarily after he smokes marijuana. It  Is relieved with his albuterol inhaler.  He states that he is using it 2 or 3 times a day over the past week, normally uses it as needed.  Symptoms are worse with smoking.  He does not have a spacer.  He denies fevers, nasal congestion, rhinorrhea, body aches, headaches, sore throat, cough.  He reports intermittent abdominal soreness.  No nausea no vomiting, diarrhea, loss of sense of smell or taste, exposure to Covid.  No hemoptysis, wheezing, dyspnea on exertion.  He has a past medical history of asthma and is not on any medications other than albuterol.  He has been seen here several times and prescribed LABA/steroid inhalers in addition to albuterol.  States that he is unable to afford the LABA/steroid inhaler.  He is a former tobacco smoker.  He has history of ulcerative colitis, allergies bronchitis, pneumonia.  [facility]:NOBSJG, Audrea Muscat, NP    Past Medical History:  Diagnosis Date  . Asthma   . Bronchitis   . Pneumonia   . Ulcerative colitis [facility])     Past Surgical History:  Procedure Laterality Date  . COLONOSCOPY WITH PROPOFOL N/A 02/21/2016   Procedure: COLONOSCOPY WITH PROPOFOL;  Surgeon: Arta Silence, MD;  Location: [facility];  Service: Endoscopy;  Laterality: N/A;    Family History  Problem Relation Age of Onset  . Healthy Mother   . Healthy Father     Social History   Tobacco Use  . Smoking status: Former Smoker    Packs/day: 0.00  . Smokeless tobacco: Never Used  Substance Use Topics  . Alcohol use: Not Currently    Comment: former  . Drug use: Yes    Types: Cocaine, Marijuana    Comment: last used cocaine today    No current facility-administered medications for this encounter.   Current Outpatient Medications:  .  albuterol (PROVENTIL HFA;VENTOLIN HFA) 108 (90 Base) MCG/ACT  inhaler, Inhale 1-2 puffs into the lungs every 6 (six) hours as needed for wheezing or shortness of breath., Disp: 1 Inhaler, Rfl: 0 .  albuterol (PROVENTIL) (2.5 MG/3ML) 0.083% nebulizer solution, Take 3 mLs (2.5 mg total) by nebulization every 6 (six) hours as needed for wheezing or shortness of breath., Disp: 75 mL, Rfl: 1 .  Budesonide 90 MCG/ACT inhaler, Inhale 2 puffs into the lungs 2 (two) times daily., Disp: 1 each, Rfl: 0 .  fluticasone (FLONASE) 50 MCG/ACT nasal spray, Place 1 spray into both nostrils daily as needed for allergies or rhinitis., Disp: 16 g, Rfl: 2 .  Fluticasone-Salmeterol (ADVAIR) 100-50 MCG/DOSE AEPB, Inhale 1 puff into the lungs 2 (two) times daily., Disp: 60 each, Rfl: 0 .  montelukast (SINGULAIR) 10 MG tablet, Take 1 tablet (10 mg total) by mouth at bedtime., Disp: 30 tablet, Rfl: 0 .  Multiple Vitamin (MULTIVITAMIN) tablet, Take 1 tablet by mouth daily., Disp: , Rfl:  .  omeprazole (PRILOSEC) 20 MG capsule, Take 1 capsule (20 mg total) by mouth daily., Disp: 30 capsule, Rfl: 0 .  sulfaSALAzine (AZULFIDINE) 500 MG tablet, Take 1,000 mg by mouth 3 (three) times daily. , Disp: , Rfl:   No Known Allergies   ROS  As noted in HPI.   Physical Exam  BP (!) 111/94 (BP Location: Left Arm)   Pulse 79   Temp 98.8 F (37.1 C) (Oral)  Resp 18   SpO2 100%   Constitutional: Well developed, well nourished, no acute distress Eyes:  EOMI, conjunctiva normal bilaterally HENT: Normocephalic, atraumatic,mucus membranes moist Respiratory: Normal inspiratory effort, good air movement, lungs clear bilaterally Cardiovascular: Normal rate regular rhythm no murmurs rubs or gallops GI: nondistended soft, nontender.  Active bowel sounds.  No guarding, rebound skin: No rash, skin intact Musculoskeletal: no deformities Neurologic: Alert & oriented x 3, no focal neuro deficits Psychiatric: Speech and behavior appropriate   ED Course   Medications  AeroChamber Plus Flo-Vu  Medium MISC 1 each (1 each Other Given 12/13/18 1223)  AeroChamber Plus Flo-Vu Large MISC (has no administration in time range)    Orders Placed This Encounter  Procedures  . Novel Coronavirus, NAA (Hosp order, Send-out to Ref Lab; TAT 18-24 hrs    Standing Status:   Standing    Number of Occurrences:   1    Order Specific Question:   Is this test for diagnosis or screening    Answer:   Screening    Order Specific Question:   Symptomatic for COVID-19 as defined by CDC    Answer:   No    Order Specific Question:   Hospitalized for COVID-19    Answer:   No    Order Specific Question:   Admitted to ICU for COVID-19    Answer:   No    Order Specific Question:   Previously tested for COVID-19    Answer:   Yes    Order Specific Question:   Resident in a congregate (group) care setting    Answer:   No    Order Specific Question:   Employed in healthcare setting    Answer:   No    No results found for this or any previous visit (from the past 24 hour(s)). No results found.  ED Clinical Impression  1. Mild intermittent asthma with exacerbation   2. Marijuana use      ED Assessment/Plan  Gave the patient a spacer here and showed him how to use it.  Discussed with him that this will make his albuterol much more effective.  Covid test sent although I suspect that he is aggravating his asthma because of smoking.  We had a long discussion about quitting smoking.  He states that he has quit cigarettes, and is working on quitting marijuana.  He does not need a refill on his albuterol inhaler or his albuterol nebulizer.  We will have him use the albuterol inhaler on a regularly scheduled basis for the next 4 days, and start him on Singulair as he states that he cannot afford steroid inhalers which have been prescribed to him from here several times.  Follow-up with PMD as needed.  Discussed  MDM, treatment plan, and plan for follow-up with patient.patient agrees with plan.   Meds ordered  this encounter  Medications  . AeroChamber Plus Flo-Vu Medium MISC 1 each  . montelukast (SINGULAIR) 10 MG tablet    Sig: Take 1 tablet (10 mg total) by mouth at bedtime.    Dispense:  30 tablet    Refill:  0    *This clinic note was created using Lobby[employer]. Therefore, there may be occasional mistakes despite careful proofreading.   ?    Melynda Ripple, MD 12/14/18 1204

## 2018-12-13 NOTE — Discharge Instructions (Addendum)
Continue to work on quitting smoking.  Use your albuterol inhaler 1 to 2 puffs every 4 hours for 2 days, then 1 to 2 puffs every 6 hours for 2 days, then you may use it as needed.  Make sure you use the spacer with your albuterol inhaler.  The Singulair will help control your asthma since the steroid inhalers are too expensive.  We will call you if your Covid test comes back positive.  Quarantine yourself until you know what your test results are.  Your test will come back in about 2 to 3 days.  He can always call here to get your results if you do not hear from [place].  Or you can sign up for MyChart to get the results as soon as it is released

## 2018-12-14 ENCOUNTER — Telehealth: Payer: Self-pay | Admitting: Emergency Medicine

## 2018-12-14 LAB — NOVEL CORONAVIRUS, NAA (HOSP ORDER, SEND-OUT TO REF LAB; TAT 18-24 HRS): SARS-CoV-2, NAA: DETECTED — AB

## 2018-12-14 NOTE — Telephone Encounter (Signed)
Positive covid detected on sample. Pt unsure of symptoms start. Will do ten days from day of test. All questions answered.

## 2018-12-25 ENCOUNTER — Other Ambulatory Visit: Payer: Self-pay

## 2018-12-25 ENCOUNTER — Ambulatory Visit (HOSPITAL_COMMUNITY)
Admission: EM | Admit: 2018-12-25 | Discharge: 2018-12-25 | Disposition: A | Discharge status: Home / Self Care | Payer: Self-pay | Attending: Family Medicine | Admitting: Family Medicine

## 2018-12-25 ENCOUNTER — Encounter (HOSPITAL_COMMUNITY): Payer: Self-pay | Admitting: Emergency Medicine

## 2018-12-25 DIAGNOSIS — J45909 Unspecified asthma, uncomplicated: Secondary | ICD-10-CM

## 2018-12-25 DIAGNOSIS — R05 Cough: Secondary | ICD-10-CM

## 2018-12-25 DIAGNOSIS — Z7251 High risk heterosexual behavior: Secondary | ICD-10-CM | POA: Insufficient documentation

## 2018-12-25 DIAGNOSIS — B948 Sequelae of other specified infectious and parasitic diseases: Secondary | ICD-10-CM | POA: Insufficient documentation

## 2018-12-25 DIAGNOSIS — R0602 Shortness of breath: Secondary | ICD-10-CM

## 2018-12-25 DIAGNOSIS — M5489 Other dorsalgia: Secondary | ICD-10-CM

## 2018-12-25 DIAGNOSIS — Z113 Encounter for screening for infections with a predominantly sexual mode of transmission: Secondary | ICD-10-CM

## 2018-12-25 DIAGNOSIS — F17210 Nicotine dependence, cigarettes, uncomplicated: Secondary | ICD-10-CM

## 2018-12-25 NOTE — Discharge Instructions (Signed)
Safe sex recommended We will call you with positive test results

## 2018-12-25 NOTE — ED Provider Notes (Signed)
[place]    CSN: [id number] Arrival date & time: 12/25/18  1859      History   Chief Complaint Chief Complaint  Patient presents with  . Bronchitis    HPI Jerome Irwin is a 38 y.o. male.   HPI  Patient is here for follow-up.  He was seen 2 weeks ago.  He was positive for Covid at that time.  Today he states he is feeling better.  He still has some cough and shortness of breath.  He is not yet out of his inhalers.  He does not need medicine refills.  No fever or chills.  No loss of sense of taste or smell.  No body aches or fatigue.  Uncertain why he is here.  He does state while he is here he would like to have STD testing.  This is typical for him.  He states he still has back pain.  He states his back hurts more when he feels short of breath.  He does not feel like he needs medicine for back pain.  He has had no back injury. Patient states he still smokes.  A few cigarettes and some marijuana.  I explained to him that this was not advisable given his lung disease and asthma.  Past Medical History:  Diagnosis Date  . Asthma   . Bronchitis   . Pneumonia   . Ulcerative colitis ([place])     There are no active problems to display for this patient.   Past Surgical History:  Procedure Laterality Date  . COLONOSCOPY WITH PROPOFOL N/A 02/21/2016   Procedure: COLONOSCOPY WITH PROPOFOL;  Surgeon: Arta Silence, MD;  Location: [facility];  Service: Endoscopy;  Laterality: N/A;       Home Medications    Prior to Admission medications   Medication Sig Start Date End Date Taking? Authorizing Provider  albuterol (PROVENTIL HFA;VENTOLIN HFA) 108 (90 Base) MCG/ACT inhaler Inhale 1-2 puffs into the lungs every 6 (six) hours as needed for wheezing or shortness of breath. 09/06/17   Wieters, Hallie C, PA-C  albuterol (PROVENTIL) (2.5 MG/3ML) 0.083% nebulizer solution Take 3 mLs (2.5 mg total) by nebulization every 6 (six) hours as needed for wheezing or shortness of  breath. 10/22/18   Chase Picket, MD  Budesonide 90 MCG/ACT inhaler Inhale 2 puffs into the lungs 2 (two) times daily. 11/13/18   Zigmund Gottron, NP  fluticasone (FLONASE) 50 MCG/ACT nasal spray Place 1 spray into both nostrils daily as needed for allergies or rhinitis. 08/30/18   Langston Masker B, PA-C  montelukast (SINGULAIR) 10 MG tablet Take 1 tablet (10 mg total) by mouth at bedtime. 12/13/18   Melynda Ripple, MD  Multiple Vitamin (MULTIVITAMIN) tablet Take 1 tablet by mouth daily.    [provider]  omeprazole (PRILOSEC) 20 MG capsule Take 1 capsule (20 mg total) by mouth daily. 11/27/18   Raylene Everts, MD  sulfaSALAzine (AZULFIDINE) 500 MG tablet Take 1,000 mg by mouth 3 (three) times daily.     [provider]  cetirizine (ZYRTEC) 5 MG tablet Take 2 tablets (10 mg total) by mouth daily. 08/30/18 10/29/18  Langston Masker B, PA-C  ferrous sulfate 325 (65 FE) MG EC tablet Take 325 mg by mouth 3 (three) times daily with meals.  10/29/18  [provider]  Fluticasone-Salmeterol (ADVAIR) 100-50 MCG/DOSE AEPB Inhale 1 puff into the lungs 2 (two) times daily. 09/06/17 12/25/18  Wieters, Hallie C, PA-C  ipratropium-albuterol (DUONEB) 0.5-2.5 (3) MG/3ML  SOLN Take 3 mLs by nebulization every 4 (four) hours as needed. 10/22/18 10/22/18  Chase Picket, MD    Family History Family History  Problem Relation Age of Onset  . Healthy Mother   . Healthy Father     Social History Social History   Tobacco Use  . Smoking status: Former Smoker    Packs/day: 0.00  . Smokeless tobacco: Never Used  Substance Use Topics  . Alcohol use: Not Currently    Comment: former  . Drug use: Yes    Types: Cocaine, Marijuana    Comment: last used cocaine today     Allergies   Patient has no known allergies.   Review of Systems Review of Systems  Constitutional: Negative for chills and fever.  HENT: Negative for ear pain and sore throat.   Eyes: Negative for pain and  visual disturbance.  Respiratory: Positive for cough and shortness of breath.        Improved  Cardiovascular: Negative for chest pain and palpitations.  Gastrointestinal: Negative for abdominal pain and vomiting.  Genitourinary: Negative.  Negative for discharge, dysuria, hematuria, penile pain, penile swelling and testicular pain.       No symptoms STD  Musculoskeletal: Negative for arthralgias and back pain.  Skin: Negative for color change and rash.  Neurological: Negative for seizures and syncope.  All other systems reviewed and are negative.    Physical Exam Triage Vital Signs ED Triage Vitals [12/25/18 1917]  Enc Vitals Group     BP 122/73     Pulse Rate (!) 107     Resp 18     Temp 99.3 F (37.4 C)     Temp src      SpO2 100 %     Weight      Height      Head Circumference      Peak Flow      Pain Score 0     Pain Loc      Pain Edu?      Excl. in [place]?    No data found.  Updated Vital Signs BP 122/73   Pulse (!) 107   Temp 99.3 F (37.4 C)   Resp 18   SpO2 100%      Physical Exam Constitutional:      General: He is not in acute distress.    Appearance: He is well-developed and normal weight. He is not ill-appearing.  HENT:     Head: Normocephalic and atraumatic.     Mouth/Throat:     Comments: Mask in place Eyes:     Conjunctiva/sclera: Conjunctivae normal.     Pupils: Pupils are equal, round, and reactive to light.  Neck:     Musculoskeletal: Normal range of motion.  Cardiovascular:     Rate and Rhythm: Normal rate and regular rhythm.     Heart sounds: Normal heart sounds.  Pulmonary:     Effort: Pulmonary effort is normal. No respiratory distress.     Breath sounds: Normal breath sounds. No wheezing.  Abdominal:     General: There is no distension.     Palpations: Abdomen is soft.  Genitourinary:    Comments: Normal circumcised penis.  No discharge Musculoskeletal: Normal range of motion.  Skin:    General: Skin is warm and dry.   Neurological:     Mental Status: He is alert.      UC Treatments / Results  Labs (all labs ordered are listed, but only abnormal  results are displayed) Labs Reviewed  CYTOLOGY, (ORAL, ANAL, URETHRAL) ANCILLARY ONLY    EKG   Radiology No results found.  Procedures Procedures (including critical care time)  Medications Ordered in UC Medications - No data to display  Initial Impression / Assessment and Plan / UC Course  I have reviewed the triage vital signs and the nursing notes.  Pertinent labs & imaging results that were available during my care of the patient were reviewed by me and considered in my medical decision making (see chart for details).     STD check. Chronic asthma shortness of breath. Follow-up Covid Final Clinical Impressions(s) / UC Diagnoses   Final diagnoses:  None     Discharge Instructions     Safe sex recommended We will call you with positive test results    ED Prescriptions    None     PDMP not reviewed this encounter.   Raylene Everts, MD 12/25/18 [phone]

## 2018-12-25 NOTE — ED Triage Notes (Signed)
Pt states he has covid, has had some back pain, sob, hx of asthma, states he also wants std testing.

## 2018-12-28 LAB — CYTOLOGY, (ORAL, ANAL, URETHRAL) ANCILLARY ONLY
Chlamydia: NEGATIVE
Neisseria Gonorrhea: NEGATIVE
Trichomonas: NEGATIVE

## 2019-01-06 ENCOUNTER — Encounter (HOSPITAL_COMMUNITY): Payer: Self-pay | Admitting: Emergency Medicine

## 2019-01-06 ENCOUNTER — Encounter (HOSPITAL_COMMUNITY): Payer: Self-pay

## 2019-01-06 ENCOUNTER — Ambulatory Visit (HOSPITAL_COMMUNITY)
Admission: EM | Admit: 2019-01-06 | Discharge: 2019-01-06 | Disposition: A | Discharge status: Home / Self Care | Payer: Self-pay | Attending: Family Medicine | Admitting: Family Medicine

## 2019-01-06 ENCOUNTER — Emergency Department (HOSPITAL_COMMUNITY)
Admission: EM | Admit: 2019-01-06 | Discharge: 2019-01-06 | Disposition: A | Discharge status: Home / Self Care | Payer: Self-pay | Attending: Emergency Medicine | Admitting: Emergency Medicine

## 2019-01-06 ENCOUNTER — Ambulatory Visit (HOSPITAL_COMMUNITY): Payer: Self-pay

## 2019-01-06 ENCOUNTER — Other Ambulatory Visit: Payer: Self-pay

## 2019-01-06 DIAGNOSIS — Z5321 Procedure and treatment not carried out due to patient leaving prior to being seen by health care provider: Secondary | ICD-10-CM | POA: Insufficient documentation

## 2019-01-06 DIAGNOSIS — R0602 Shortness of breath: Secondary | ICD-10-CM | POA: Insufficient documentation

## 2019-01-06 DIAGNOSIS — R062 Wheezing: Secondary | ICD-10-CM

## 2019-01-06 DIAGNOSIS — U071 COVID-19: Secondary | ICD-10-CM | POA: Insufficient documentation

## 2019-01-06 MED ORDER — METHYLPREDNISOLONE SODIUM SUCC 125 MG IJ SOLR
INTRAMUSCULAR | Status: AC
  Filled 2019-01-06: qty 2

## 2019-01-06 MED ORDER — METHYLPREDNISOLONE SODIUM SUCC 125 MG IJ SOLR
125.0000 mg | Freq: Once | INTRAMUSCULAR | Status: AC
  Administered 2019-01-06: 19:00:00 125 mg via INTRAMUSCULAR

## 2019-01-06 MED ORDER — PREDNISONE 10 MG (21) PO TBPK: ORAL_TABLET | Freq: Every day | ORAL | 0 refills | Status: DC

## 2019-01-06 NOTE — ED Triage Notes (Addendum)
3 weeks ago patient tested positive for covid  Patient repeatedly refers to his bronchitis acting up  Sob for 2 weeks.    Patient was at [facility] long today, but didn't want to waits

## 2019-01-06 NOTE — ED Triage Notes (Signed)
Patient reports testing positive about 3 weeks ago.  Patient reports difficulty breathing on and off X2 weeks.   Patient denies chest pain, cough, nausea, vomiting, or fever.    98.4 oral in triage  99%RA

## 2019-01-07 NOTE — ED Provider Notes (Signed)
Jerome Irwin   [id number] 01/06/19 Arrival Time: 5681  ASSESSMENT & PLAN:  1. Wheezing     No indication for chest imaging at this time. No respiratory distress.  Unable to fill Rx until tomorrow.  Given here: . methylPREDNISolone sodium succinate (SOLU-MEDROL) 125 mg/2 mL injection 125 mg   Begin: Meds ordered this encounter  Medications  . predniSONE (STERAPRED UNI-PAK 21 TAB) 10 MG (21) TBPK tablet    Sig: Take by mouth daily. Take as directed.    Dispense:  21 tablet    Refill:  0    OTC symptom care as needed. Ensure adequate fluid intake and rest. May f/u with PCP or here as needed.  Reviewed expectations re: course of current medical issues. Questions answered. Outlined signs and symptoms indicating need for more acute intervention. Patient verbalized understanding. After Visit Summary given.   SUBJECTIVE: History from: patient. 22 ED visits within the past 6 months noted. Relates current symptoms to COVID infection three weeks ago.  Jerome Irwin is a 38 y.o. male who presents with complaint of "bronchitis"; describes chest constriction/wheezing. On/off over the past two weeks with occasional and mild SOB when symptoms at their worst. Currently without SOB or CP. Questions sporadic episodes of wheezing typically worse at night. No fevers reported/suspected. Uses albuterol neb at home with temporary relief. Normal PO intake without n/v. Ambulatory without difficulty. Current symptoms have not limited his normal daily activities. No OTC treatment reported. Went to [facility] ED today; LWBS.  Social History   Tobacco Use  Smoking Status Former Smoker  . Packs/day: 0.00  Smokeless Tobacco Never Used    ROS: As per HPI. All other systems negative.    OBJECTIVE:  Vitals:   01/06/19 1817  BP: 123/80  Pulse: 94  Resp: 20  Temp: 99.3 F (37.4 C)  TempSrc: Oral  SpO2: 96%     General appearance: alert; NAD HEENT: without nasal congestion  Neck: supple without LAD CV: RRR Lungs: unlabored respirations, symmetrical air entry with mild bilateral wheezing; cough: absent; able to speak full sentences without difficutly Abd: soft; non-distended Ext: no LE edema Skin: warm and dry Psychological: alert and cooperative; normal mood and affect   No Known Allergies  Past Medical History:  Diagnosis Date  . Asthma   . Bronchitis   . Pneumonia   . Ulcerative colitis ([place])    Family History  Problem Relation Age of Onset  . Healthy Mother   . Healthy Father    Social History   Socioeconomic History  . Marital status: Single    Spouse name: Not on file  . Number of children: Not on file  . Years of education: Not on file  . Highest education level: Not on file  Occupational History  . Not on file  Tobacco Use  . Smoking status: Former Smoker    Packs/day: 0.00  . Smokeless tobacco: Never Used  Substance and Sexual Activity  . Alcohol use: Not Currently    Comment: former  . Drug use: Yes    Types: Cocaine, Marijuana    Comment: last used cocaine today  . Sexual activity: Yes  Other Topics Concern  . Not on file  Social History Narrative  . Not on file   Social Determinants of Health   Financial Resource Strain:   . Difficulty of Paying Living Expenses: Not on file  Food Insecurity:   . Worried About Charity fundraiser in the Last Year: Not on file  .  Ran Out of Food in the Last Year: Not on file  Transportation Needs:   . Lack of Transportation (Medical): Not on file  . Lack of Transportation (Non-Medical): Not on file  Physical Activity:   . Days of Exercise per Week: Not on file  . Minutes of Exercise per Session: Not on file  Stress:   . Feeling of Stress : Not on file  Social Connections:   . Frequency of Communication with Friends and Family: Not on file  . Frequency of Social Gatherings with Friends and Family: Not on file  . Attends Religious Services: Not on file  . Active Member of Clubs  or Organizations: Not on file  . Attends Archivist Meetings: Not on file  . Marital Status: Not on file  Intimate Partner Violence:   . Fear of Current or Ex-Partner: Not on file  . Emotionally Abused: Not on file  . Physically Abused: Not on file  . Sexually Abused: Not on file           Vanessa Kick, MD 01/07/19 [phone]

## 2019-01-16 ENCOUNTER — Other Ambulatory Visit: Payer: Self-pay

## 2019-01-16 ENCOUNTER — Encounter (HOSPITAL_COMMUNITY): Payer: Self-pay

## 2019-01-16 ENCOUNTER — Emergency Department (HOSPITAL_COMMUNITY)
Admission: EM | Admit: 2019-01-16 | Discharge: 2019-01-17 | Disposition: A | Discharge status: Home / Self Care | Payer: Self-pay | Attending: Emergency Medicine | Admitting: Emergency Medicine

## 2019-01-16 DIAGNOSIS — Z87891 Personal history of nicotine dependence: Secondary | ICD-10-CM | POA: Insufficient documentation

## 2019-01-16 DIAGNOSIS — Z79899 Other long term (current) drug therapy: Secondary | ICD-10-CM | POA: Insufficient documentation

## 2019-01-16 DIAGNOSIS — R0602 Shortness of breath: Secondary | ICD-10-CM | POA: Insufficient documentation

## 2019-01-16 DIAGNOSIS — F1220 Cannabis dependence, uncomplicated: Secondary | ICD-10-CM | POA: Insufficient documentation

## 2019-01-16 DIAGNOSIS — J45909 Unspecified asthma, uncomplicated: Secondary | ICD-10-CM | POA: Insufficient documentation

## 2019-01-16 NOTE — ED Triage Notes (Signed)
Pt reports pressure in his back due to bronchitis. Pt denies CP, SHOB, cough or fever. Pt was COVID+ 4 weeks ago, states he is no longer having symptoms.

## 2019-01-17 ENCOUNTER — Emergency Department (HOSPITAL_COMMUNITY): Payer: Self-pay

## 2019-01-17 MED ORDER — PREDNISONE 10 MG PO TABS: 20.0000 mg | ORAL_TABLET | Freq: Every day | ORAL | 0 refills | Status: DC

## 2019-01-17 MED ORDER — PREDNISONE 50 MG PO TABS
50.0000 mg | ORAL_TABLET | Freq: Once | ORAL | Status: AC
  Administered 2019-01-17: 50 mg via ORAL
  Filled 2019-01-17: qty 1

## 2019-01-17 MED ORDER — ALBUTEROL SULFATE HFA 108 (90 BASE) MCG/ACT IN AERS
4.0000 | INHALATION_SPRAY | Freq: Once | RESPIRATORY_TRACT | Status: AC
  Administered 2019-01-17: 4 via RESPIRATORY_TRACT
  Filled 2019-01-17: qty 6.7

## 2019-01-17 NOTE — ED Notes (Signed)
Pt ambulatory to treatment room with steady gait.

## 2019-01-17 NOTE — Discharge Instructions (Addendum)
Thank you for allowing me to care for you today in the Emergency Department.   You need to stop smoking to reduce flare ups of your symptoms.  If you continue to smoke, you will continue to have exacerbations.  Take 5 tablets of prednisone daily for the next 5 days starting tomorrow.  Use 2 puffs of your albuterol inhaler with your spacer every 4 hours as needed for wheezing or shortness of breath or take a nebulizer treatment.  If your symptoms do not improve, please follow-up with primary care.  If you do not have a primary care provider, call the number on your discharge paperwork to get established with 1.  Return to the emergency department if you develop severe shortness of breath with chest pain, high fevers, if you pass out, develop new numbness or weakness, vomiting, or other new, concerning symptoms.

## 2019-01-17 NOTE — ED Provider Notes (Signed)
[facility] DEPT Provider Note   CSN: [id number] Arrival date & time: 01/16/19  2310     History Chief Complaint  Patient presents with  . Back Pain    Jerome Irwin is a 38 y.o. male with a history of cannabis use disorder, cocaine use disorder, asthma, and bronchitis who presents to the emergency department with a chief complaint of "my bronchitis is flaring up."  Patient reports that he has been feeling more short of breath over the last few days and is having some pain bilaterally in his mid back.  Pain is nonradiating, dull, achy, and constant.  He denies cough, wheezing, chest pain, fever chills, leg swelling, dizziness, lightheadedness, headache, URI symptoms, abdominal pain, nausea, vomiting, diarrhea, or GU symptoms.  Reports that he smokes marijuana daily, but is trying to cut back.  He does not use tobacco products.  He does intermittently use cocaine, but reports that he has not used in the last few days.  Reports he treated his symptoms at home earlier tonight by taking 1 puff of his albuterol inhaler and taking a nebulizer treatment prior to arrival.   He tested positive for COVID-19 over a month ago on November 15.  He was ambulated on pulse ox and did not become hypoxic.  We will discharge the patient with a burst of prednisone and encourage primary care follow-up.  Doubt bacterial pneumonia or COVID-19.  He is hemodynamically stable in no acute distress.  ER return precautions given.  Safe for discharge home with outpatient follow-up as needed.  The history is provided by the patient. No language interpreter was used.       Past Medical History:  Diagnosis Date  . Asthma   . Bronchitis   . Pneumonia   . Ulcerative colitis ([place])     There are no problems to display for this patient.   Past Surgical History:  Procedure Laterality Date  . COLONOSCOPY WITH PROPOFOL N/A 02/21/2016   Procedure: COLONOSCOPY WITH PROPOFOL;  Surgeon:  Arta Silence, MD;  Location: [facility];  Service: Endoscopy;  Laterality: N/A;       Family History  Problem Relation Age of Onset  . Healthy Mother   . Healthy Father     Social History   Tobacco Use  . Smoking status: Former Smoker    Packs/day: 0.00  . Smokeless tobacco: Never Used  Substance Use Topics  . Alcohol use: Not Currently    Comment: former  . Drug use: Yes    Types: Cocaine, Marijuana    Comment: last used cocaine today    Home Medications Prior to Admission medications   Medication Sig Start Date End Date Taking? Authorizing Provider  albuterol (PROVENTIL HFA;VENTOLIN HFA) 108 (90 Base) MCG/ACT inhaler Inhale 1-2 puffs into the lungs every 6 (six) hours as needed for wheezing or shortness of breath. 09/06/17   Wieters, Hallie C, PA-C  albuterol (PROVENTIL) (2.5 MG/3ML) 0.083% nebulizer solution Take 3 mLs (2.5 mg total) by nebulization every 6 (six) hours as needed for wheezing or shortness of breath. 10/22/18   Chase Picket, MD  Budesonide 90 MCG/ACT inhaler Inhale 2 puffs into the lungs 2 (two) times daily. 11/13/18   Zigmund Gottron, NP  omeprazole (PRILOSEC) 20 MG capsule Take 1 capsule (20 mg total) by mouth daily. 11/27/18   Raylene Everts, MD  predniSONE (DELTASONE) 10 MG tablet Take 2 tablets (20 mg total) by mouth daily. 01/17/19   Taim Wurm, Maree Erie A, PA-C  sulfaSALAzine (AZULFIDINE) 500 MG tablet Take 1,000 mg by mouth 3 (three) times daily.     [provider]  cetirizine (ZYRTEC) 5 MG tablet Take 2 tablets (10 mg total) by mouth daily. 08/30/18 10/29/18  Langston Masker B, PA-C  ferrous sulfate 325 (65 FE) MG EC tablet Take 325 mg by mouth 3 (three) times daily with meals.  10/29/18  [provider]  fluticasone (FLONASE) 50 MCG/ACT nasal spray Place 1 spray into both nostrils daily as needed for allergies or rhinitis. 08/30/18 01/06/19  Langston Masker B, PA-C  Fluticasone-Salmeterol (ADVAIR) 100-50 MCG/DOSE AEPB Inhale 1 puff into  the lungs 2 (two) times daily. 09/06/17 12/25/18  Wieters, Hallie C, PA-C  ipratropium-albuterol (DUONEB) 0.5-2.5 (3) MG/3ML SOLN Take 3 mLs by nebulization every 4 (four) hours as needed. 10/22/18 10/22/18  Chase Picket, MD  montelukast (SINGULAIR) 10 MG tablet Take 1 tablet (10 mg total) by mouth at bedtime. 12/13/18 01/06/19  Melynda Ripple, MD    Allergies    Patient has no known allergies.  Review of Systems   Review of Systems  Constitutional: Negative for appetite change, chills and fever.  HENT: Negative for congestion and sore throat.   Eyes: Negative for visual disturbance.  Respiratory: Positive for shortness of breath.   Cardiovascular: Negative for chest pain, palpitations and leg swelling.  Gastrointestinal: Negative for abdominal pain, blood in stool, diarrhea, nausea and vomiting.  Genitourinary: Negative for dysuria.  Musculoskeletal: Positive for back pain. Negative for arthralgias, joint swelling, myalgias, neck pain and neck stiffness.  Skin: Negative for rash.  Allergic/Immunologic: Negative for immunocompromised state.  Neurological: Negative for dizziness, speech difficulty, weakness, numbness and headaches.  Psychiatric/Behavioral: Negative for confusion.   Physical Exam Updated Vital Signs BP 116/81 (BP Location: Right Arm)   Pulse 76   Temp 98.1 F (36.7 C) (Oral)   Resp 18   Ht 5' 11"  (1.803 m)   Wt 86.2 kg   SpO2 94%   BMI 26.50 kg/m   Physical Exam Vitals and nursing note reviewed.  Constitutional:      General: He is not in acute distress.    Appearance: He is well-developed. He is not ill-appearing, toxic-appearing or diaphoretic.  HENT:     Head: Normocephalic.  Eyes:     Conjunctiva/sclera: Conjunctivae normal.  Cardiovascular:     Rate and Rhythm: Normal rate and regular rhythm.     Heart sounds: No murmur.  Pulmonary:     Effort: Pulmonary effort is normal.     Comments: Lung sounds are diminished throughout.  Equal breath  sounds in all lung fields.  No increased work of breathing.  No adventitious breath sounds. Abdominal:     General: There is no distension.     Palpations: Abdomen is soft. There is no mass.     Tenderness: There is no abdominal tenderness. There is no right CVA tenderness, left CVA tenderness, guarding or rebound.     Hernia: No hernia is present.  Musculoskeletal:     Cervical back: Neck supple.     Comments: No reproducible tenderness palpation to the cervical, thoracic, or lumbar spinous processes or bilateral paraspinal muscles or to the musculature of the back.   Skin:    General: Skin is warm and dry.  Neurological:     Mental Status: He is alert.  Psychiatric:        Behavior: Behavior normal.     ED Results / Procedures / Treatments   Labs (all labs  ordered are listed, but only abnormal results are displayed) Labs Reviewed - No data to display  EKG None  Radiology DG Chest Portable 1 View  Result Date: 01/17/2019 CLINICAL DATA:  Shortness of breath. EXAM: PORTABLE CHEST 1 VIEW COMPARISON:  11/20/2018 FINDINGS: The heart size and mediastinal contours are within normal limits. Both lungs are clear. The visualized skeletal structures are unremarkable. IMPRESSION: No active disease. Electronically Signed   By: Constance Holster M.D.   On: 01/17/2019 00:32    Procedures Procedures (including critical care time)  Medications Ordered in ED Medications  albuterol (VENTOLIN HFA) 108 (90 Base) MCG/ACT inhaler 4 puff (4 puffs Inhalation Given 01/17/19 0029)  predniSONE (DELTASONE) tablet 50 mg (50 mg Oral Given 01/17/19 0028)    ED Course  I have reviewed the triage vital signs and the nursing notes.  Pertinent labs & imaging results that were available during my care of the patient were reviewed by me and considered in my medical decision making (see chart for details).    MDM Rules/Calculators/A&P                      38 year old male with a history of cannabis use  disorder, cocaine use disorder, asthma, and bronchitis presenting with shortness of breath and back pain that has been progressively worsening over the last few days.  Patient states that it feels similar to a bronchitis flare.  He does endorse that he smokes marijuana daily.  He tested positive for COVID-19 on 11/15, but reports that he has had no constitutional symptoms and is feeling much better.  Will order albuterol inhaler and prednisone and check a chest x-ray since lung sounds are diminished throughout.   On reevaluation, he reports that he is feeling much better.  Final Clinical Impression(s) / ED Diagnoses Final diagnoses:  Shortness of breath  Severe cannabis use disorder ([place])    Rx / DC Orders ED Discharge Orders         Ordered    predniSONE (DELTASONE) 10 MG tablet  Daily     01/17/19 0139           Joanne Gavel, PA-C 01/17/19 1610    Merryl Hacker, MD 01/17/19 2317

## 2019-01-17 NOTE — ED Notes (Signed)
Pt walked in the hall and back to room with a normal gait and O2 never went below 94%.  He said his back feels a lot better after taking that pill.

## 2019-01-26 ENCOUNTER — Other Ambulatory Visit: Payer: Self-pay

## 2019-01-26 ENCOUNTER — Ambulatory Visit (HOSPITAL_COMMUNITY): Admission: EM | Admit: 2019-01-26 | Discharge: 2019-01-26 | Discharge status: Against Medical Advice | Payer: Self-pay

## 2019-03-15 ENCOUNTER — Other Ambulatory Visit: Payer: Self-pay

## 2019-03-15 ENCOUNTER — Ambulatory Visit (HOSPITAL_COMMUNITY)
Admission: EM | Admit: 2019-03-15 | Discharge: 2019-03-15 | Disposition: A | Discharge status: Home / Self Care | Payer: Self-pay | Attending: Family Medicine | Admitting: Family Medicine

## 2019-03-15 ENCOUNTER — Encounter (HOSPITAL_COMMUNITY): Payer: Self-pay

## 2019-03-15 DIAGNOSIS — Z202 Contact with and (suspected) exposure to infections with a predominantly sexual mode of transmission: Secondary | ICD-10-CM | POA: Insufficient documentation

## 2019-03-15 DIAGNOSIS — R369 Urethral discharge, unspecified: Secondary | ICD-10-CM | POA: Insufficient documentation

## 2019-03-15 DIAGNOSIS — Z113 Encounter for screening for infections with a predominantly sexual mode of transmission: Secondary | ICD-10-CM | POA: Insufficient documentation

## 2019-03-15 LAB — POCT URINALYSIS DIP (DEVICE)
Bilirubin Urine: NEGATIVE
Glucose, UA: NEGATIVE mg/dL
Ketones, ur: NEGATIVE mg/dL
Leukocytes,Ua: NEGATIVE
Nitrite: NEGATIVE
Protein, ur: NEGATIVE mg/dL
Specific Gravity, Urine: >=1.03 (ref 1.005–1.030)
Urobilinogen, UA: 0.2 mg/dL (ref 0.0–1.0)
pH: 5.5 (ref 5.0–8.0)

## 2019-03-15 MED ORDER — CEFTRIAXONE SODIUM 500 MG IJ SOLR
500.0000 mg | Freq: Once | INTRAMUSCULAR | Status: AC
  Administered 2019-03-15: 11:00:00 500 mg via INTRAMUSCULAR

## 2019-03-15 MED ORDER — LIDOCAINE HCL (PF) 1 % IJ SOLN
INTRAMUSCULAR | Status: AC
  Filled 2019-03-15: qty 2

## 2019-03-15 MED ORDER — CEFTRIAXONE SODIUM 500 MG IJ SOLR
INTRAMUSCULAR | Status: AC
  Filled 2019-03-15: qty 500

## 2019-03-15 MED ORDER — DOXYCYCLINE HYCLATE 100 MG PO CAPS: 100.0000 mg | ORAL_CAPSULE | Freq: Two times a day (BID) | ORAL | 0 refills | Status: DC

## 2019-03-15 NOTE — Discharge Instructions (Signed)
You were treated with an antibiotic today called Rocephin.  Additionally, you are prescribed doxycycline twice a day for 7 days.    Do not have sex for 7 days.  Your STD tests are pending.  If your test results are positive, we will call you.  You may need additional treatment and your partner(s) may also need treatment.

## 2019-03-15 NOTE — ED Triage Notes (Signed)
Pt c/o penile drainage and urinary urgency with burning. Partner tested positive for gonorrhea. Denies abd pain, fever, chills.

## 2019-03-15 NOTE — ED Provider Notes (Signed)
[facility]    CSN: [id number] Arrival date & time: 03/15/19  0931      History   Chief Complaint Chief Complaint  Patient presents with  . SEXUALLY TRANSMITTED DISEASE    HPI Jerome Irwin is a 39 y.o. male.   Reports known exposure to gonorrhea, unsure of exactly when. States that previous sexual partner just told him yesterday that they had gonorrhea. Reports clear to white penile discharge with some burning with urination x 2 weeks. Denies fever, ST, HA, chills, n/v/d, rash.  The history is provided by the patient.    Past Medical History:  Diagnosis Date  . Asthma   . Bronchitis   . Pneumonia   . Ulcerative colitis ([place])     There are no problems to display for this patient.   Past Surgical History:  Procedure Laterality Date  . COLONOSCOPY WITH PROPOFOL N/A 02/21/2016   Procedure: COLONOSCOPY WITH PROPOFOL;  Surgeon: Arta Silence, MD;  Location: [facility];  Service: Endoscopy;  Laterality: N/A;       Home Medications    Prior to Admission medications   Medication Sig Start Date End Date Taking? Authorizing Provider  albuterol (PROVENTIL HFA;VENTOLIN HFA) 108 (90 Base) MCG/ACT inhaler Inhale 1-2 puffs into the lungs every 6 (six) hours as needed for wheezing or shortness of breath. 09/06/17  Yes Wieters, Hallie C, PA-C  albuterol (PROVENTIL) (2.5 MG/3ML) 0.083% nebulizer solution Take 3 mLs (2.5 mg total) by nebulization every 6 (six) hours as needed for wheezing or shortness of breath. 10/22/18  Yes Lamptey, Myrene Galas, MD  sulfaSALAzine (AZULFIDINE) 500 MG tablet Take 1,000 mg by mouth 3 (three) times daily.    Yes [provider]  Budesonide 90 MCG/ACT inhaler Inhale 2 puffs into the lungs 2 (two) times daily. 11/13/18   Zigmund Gottron, NP  doxycycline (VIBRAMYCIN) 100 MG capsule Take 1 capsule (100 mg total) by mouth 2 (two) times daily. 03/15/19   Faustino Congress, NP  omeprazole (PRILOSEC) 20 MG capsule Take 1 capsule (20 mg  total) by mouth daily. 11/27/18   Raylene Everts, MD  predniSONE (DELTASONE) 10 MG tablet Take 2 tablets (20 mg total) by mouth daily. 01/17/19   McDonald, Mia A, PA-C  cetirizine (ZYRTEC) 5 MG tablet Take 2 tablets (10 mg total) by mouth daily. 08/30/18 10/29/18  Langston Masker B, PA-C  ferrous sulfate 325 (65 FE) MG EC tablet Take 325 mg by mouth 3 (three) times daily with meals.  10/29/18  [provider]  fluticasone (FLONASE) 50 MCG/ACT nasal spray Place 1 spray into both nostrils daily as needed for allergies or rhinitis. 08/30/18 01/06/19  Langston Masker B, PA-C  Fluticasone-Salmeterol (ADVAIR) 100-50 MCG/DOSE AEPB Inhale 1 puff into the lungs 2 (two) times daily. 09/06/17 12/25/18  Wieters, Hallie C, PA-C  ipratropium-albuterol (DUONEB) 0.5-2.5 (3) MG/3ML SOLN Take 3 mLs by nebulization every 4 (four) hours as needed. 10/22/18 10/22/18  Chase Picket, MD  montelukast (SINGULAIR) 10 MG tablet Take 1 tablet (10 mg total) by mouth at bedtime. 12/13/18 01/06/19  Melynda Ripple, MD    Family History Family History  Problem Relation Age of Onset  . Healthy Mother   . Healthy Father     Social History Social History   Tobacco Use  . Smoking status: Former Smoker    Packs/day: 0.00  . Smokeless tobacco: Never Used  Substance Use Topics  . Alcohol use: Not Currently    Comment: former  . Drug use:  Yes    Types: Cocaine, Marijuana    Comment: last used cocaine today     Allergies   Patient has no known allergies.   Review of Systems Review of Systems  Constitutional: Negative for chills, fatigue and fever.  HENT: Negative for postnasal drip, rhinorrhea and sore throat.   Eyes: Negative for pain and visual disturbance.  Respiratory: Negative for cough, shortness of breath and wheezing.   Cardiovascular: Negative for chest pain and palpitations.  Gastrointestinal: Negative for abdominal distention, abdominal pain, anal bleeding, diarrhea, nausea and vomiting.    Genitourinary: Positive for discharge and dysuria. Negative for hematuria.  Musculoskeletal: Negative for arthralgias, back pain and myalgias.  Skin: Negative for color change and rash.  Neurological: Negative for dizziness, seizures, syncope and weakness.  All other systems reviewed and are negative.    Physical Exam Triage Vital Signs ED Triage Vitals  Enc Vitals Group     BP 03/15/19 1006 127/89     Pulse Rate 03/15/19 1006 77     Resp 03/15/19 1006 16     Temp 03/15/19 1006 98 F (36.7 C)     Temp Source 03/15/19 1006 Oral     SpO2 03/15/19 1006 97 %     Weight --      Height --      Head Circumference --      Peak Flow --      Pain Score 03/15/19 1004 0     Pain Loc --      Pain Edu? --      Excl. in [place]? --    No data found.  Updated Vital Signs BP 127/89 (BP Location: Left Arm)   Pulse 77   Temp 98 F (36.7 C) (Oral)   Resp 16   SpO2 97%   Visual Acuity Right Eye Distance:   Left Eye Distance:   Bilateral Distance:    Right Eye Near:   Left Eye Near:    Bilateral Near:     Physical Exam Vitals and nursing note reviewed.  Constitutional:      General: He is not in acute distress.    Appearance: Normal appearance. He is well-developed.  HENT:     Head: Normocephalic and atraumatic.     Nose: Nose normal.     Mouth/Throat:     Mouth: Mucous membranes are moist.  Eyes:     Conjunctiva/sclera: Conjunctivae normal.  Cardiovascular:     Rate and Rhythm: Normal rate and regular rhythm.     Pulses: Normal pulses.     Heart sounds: No murmur.  Pulmonary:     Effort: Pulmonary effort is normal. No respiratory distress.     Breath sounds: Normal breath sounds. No stridor. No wheezing, rhonchi or rales.  Chest:     Chest wall: No tenderness.  Abdominal:     General: Abdomen is flat. Bowel sounds are normal.     Palpations: Abdomen is soft.     Tenderness: There is no abdominal tenderness.  Genitourinary:    Penis: Normal.   Musculoskeletal:         General: Normal range of motion.     Cervical back: Neck supple.  Skin:    General: Skin is warm and dry.  Neurological:     General: No focal deficit present.     Mental Status: He is alert and oriented to person, place, and time.  Psychiatric:        Mood and Affect: Mood normal.  Behavior: Behavior normal.      UC Treatments / Results  Labs (all labs ordered are listed, but only abnormal results are displayed) Labs Reviewed  POCT URINALYSIS DIP (DEVICE) - Abnormal; Notable for the following components:      Result Value   Hgb urine dipstick MODERATE (*)    All other components within normal limits  CYTOLOGY, (ORAL, ANAL, URETHRAL) ANCILLARY ONLY    EKG   Radiology No results found.  Procedures Procedures (including critical care time)  Medications Ordered in UC Medications  cefTRIAXone (ROCEPHIN) injection 500 mg (has no administration in time range)    Initial Impression / Assessment and Plan / UC Course  I have reviewed the triage vital signs and the nursing notes.  Pertinent labs & imaging results that were available during my care of the patient were reviewed by me and considered in my medical decision making (see chart for details).     STD testing Aptima swab obtained in office today. Will call with results. Treated with Rocephin 500 mg IM in office and prescribed Doxycycline 152m BID x 7 days. Instructed to abstain from sex during the course of treatment. Instructed on when to report to the ER. Final Clinical Impressions(s) / UC Diagnoses   Final diagnoses:  Screen for STD (sexually transmitted disease)  Penile discharge  Exposure to STD     Discharge Instructions     You were treated with an antibiotic today called Rocephin.  Additionally, you are prescribed doxycycline twice a day for 7 days.    Do not have sex for 7 days.  Your STD tests are pending.  If your test results are positive, we will call you.  You may need additional  treatment and your partner(s) may also need treatment.         ED Prescriptions    Medication Sig Dispense Auth. Provider   doxycycline (VIBRAMYCIN) 100 MG capsule Take 1 capsule (100 mg total) by mouth 2 (two) times daily. 14 capsule MFaustino Congress NP     I have reviewed the PDMP during this encounter.   MFaustino Congress NP 03/15/19 1039

## 2019-03-16 LAB — CYTOLOGY, (ORAL, ANAL, URETHRAL) ANCILLARY ONLY
Chlamydia: NEGATIVE
Neisseria Gonorrhea: NEGATIVE
Trichomonas: NEGATIVE

## 2019-03-30 ENCOUNTER — Ambulatory Visit (HOSPITAL_COMMUNITY)
Admission: EM | Admit: 2019-03-30 | Discharge: 2019-03-30 | Disposition: A | Discharge status: Home / Self Care | Payer: Self-pay | Attending: Family Medicine | Admitting: Family Medicine

## 2019-03-30 ENCOUNTER — Other Ambulatory Visit: Payer: Self-pay

## 2019-03-30 DIAGNOSIS — Z202 Contact with and (suspected) exposure to infections with a predominantly sexual mode of transmission: Secondary | ICD-10-CM | POA: Insufficient documentation

## 2019-03-30 DIAGNOSIS — K51911 Ulcerative colitis, unspecified with rectal bleeding: Secondary | ICD-10-CM | POA: Insufficient documentation

## 2019-03-30 LAB — POCT URINALYSIS DIP (DEVICE)
Bilirubin Urine: NEGATIVE
Glucose, UA: NEGATIVE mg/dL
Ketones, ur: NEGATIVE mg/dL
Leukocytes,Ua: NEGATIVE
Nitrite: NEGATIVE
Protein, ur: NEGATIVE mg/dL
Specific Gravity, Urine: >=1.03 (ref 1.005–1.030)
Urobilinogen, UA: 0.2 mg/dL (ref 0.0–1.0)
pH: 5 (ref 5.0–8.0)

## 2019-03-30 MED ORDER — SULFASALAZINE 500 MG PO TABS: 1000.0000 mg | ORAL_TABLET | Freq: Three times a day (TID) | ORAL | 0 refills | Status: DC

## 2019-03-30 MED ORDER — CEFTRIAXONE SODIUM 500 MG IJ SOLR
500.0000 mg | Freq: Once | INTRAMUSCULAR | Status: AC
  Administered 2019-03-30: 500 mg via INTRAMUSCULAR

## 2019-03-30 MED ORDER — DICYCLOMINE HCL 20 MG PO TABS: 20.0000 mg | ORAL_TABLET | Freq: Three times a day (TID) | ORAL | 0 refills | Status: DC

## 2019-03-30 MED ORDER — CEFTRIAXONE SODIUM 500 MG IJ SOLR
INTRAMUSCULAR | Status: AC
  Filled 2019-03-30: qty 500

## 2019-03-30 MED ORDER — PREDNISONE 20 MG PO TABS: 20.0000 mg | ORAL_TABLET | Freq: Two times a day (BID) | ORAL | 0 refills | Status: AC

## 2019-03-30 NOTE — Discharge Instructions (Signed)
We retreated you for gonorrhea today, we are retesting as well Please refrain from intercourse x 1 week  Prednisone 20 mg twice daily for 5 days, with food for ulcerative colitis Continue sulfasalazine Follow up with gastroenterology for management of UC

## 2019-03-30 NOTE — ED Triage Notes (Signed)
Pt states his partner was positive for gonorrhea. Pt states he finished his STI tx last Sunday (approx 10 days) ago but had sexual intercourse with partner who did not take her tx for STI. Reports tingling at penis, general abd pain with moderate amount blood in stool Reports h/o ulcerative colitis.  Denies n/v, fever, chills.

## 2019-03-30 NOTE — ED Provider Notes (Signed)
[place]    CSN: [id number] Arrival date & time: 03/30/19  1038      History   Chief Complaint Chief Complaint  Patient presents with   Exposure to STD    HPI Jerome Irwin is a 39 y.o. male history of asthma, ulcerative colitis presenting today for exposure to STD as well as ulcerative colitis flare.  Patient states that he was recently exposed to gonorrhea, had treatment, but had intercourse prior to completion of partners treatment.  Patient was recently seen here on 2/15 and treated with Rocephin along with doxycycline x1 week.  Urethral swab returned negative for gonorrhea, chlamydia and trichomonas.  He does endorse some penile tingling and urinary frequency.  Denies dysuria.  Denies penile discharge.  Denies testicular pain or swelling.  Denies any specific rash to genital area.  He also reports that he feels as if he is having ulcerative colitis flare.  Over the past week he has had abdominal cramping and more recently has noticed blood in stool.  Notes that is both mixed in stool as well as in water.  Notes that this is typical symptoms of his UC flares.  He is on sulfasalazine, but is requesting refill.  Does not currently follow-up with gastroenterology as he does not have insurance.  Last colonoscopy appears to be from 2018.  Reports he typically responds to steroids for flares.  Denies nausea or vomiting.  HPI  Past Medical History:  Diagnosis Date   Asthma    Bronchitis    Pneumonia    Ulcerative colitis ([place])     There are no problems to display for this patient.   Past Surgical History:  Procedure Laterality Date   COLONOSCOPY WITH PROPOFOL N/A 02/21/2016   Procedure: COLONOSCOPY WITH PROPOFOL;  Surgeon: Arta Silence, MD;  Location: [facility];  Service: Endoscopy;  Laterality: N/A;       Home Medications    Prior to Admission medications   Medication Sig Start Date End Date Taking? Authorizing Provider  albuterol (PROVENTIL  HFA;VENTOLIN HFA) 108 (90 Base) MCG/ACT inhaler Inhale 1-2 puffs into the lungs every 6 (six) hours as needed for wheezing or shortness of breath. 09/06/17  Yes Shaunice Levitan C, PA-C  albuterol (PROVENTIL) (2.5 MG/3ML) 0.083% nebulizer solution Take 3 mLs (2.5 mg total) by nebulization every 6 (six) hours as needed for wheezing or shortness of breath. 10/22/18  Yes Lamptey, Myrene Galas, MD  Budesonide 90 MCG/ACT inhaler Inhale 2 puffs into the lungs 2 (two) times daily. 11/13/18   Zigmund Gottron, NP  dicyclomine (BENTYL) 20 MG tablet Take 1 tablet (20 mg total) by mouth 4 (four) times daily -  before meals and at bedtime. As needed for abdominal cramping 03/30/19   Corine Solorio, [employer] C, PA-C  omeprazole (PRILOSEC) 20 MG capsule Take 1 capsule (20 mg total) by mouth daily. 11/27/18   Raylene Everts, MD  predniSONE (DELTASONE) 20 MG tablet Take 1 tablet (20 mg total) by mouth 2 (two) times daily with a meal for 5 days. 03/30/19 04/04/19  Lakeith Careaga C, PA-C  sulfaSALAzine (AZULFIDINE) 500 MG tablet Take 2 tablets (1,000 mg total) by mouth 3 (three) times daily. 03/30/19 06/28/19  Zienna Ahlin C, PA-C  cetirizine (ZYRTEC) 5 MG tablet Take 2 tablets (10 mg total) by mouth daily. 08/30/18 10/29/18  Langston Masker B, PA-C  ferrous sulfate 325 (65 FE) MG EC tablet Take 325 mg by mouth 3 (three) times daily with meals.  10/29/18  [provider]  fluticasone (FLONASE) 50 MCG/ACT nasal spray Place 1 spray into both nostrils daily as needed for allergies or rhinitis. 08/30/18 01/06/19  Langston Masker B, PA-C  Fluticasone-Salmeterol (ADVAIR) 100-50 MCG/DOSE AEPB Inhale 1 puff into the lungs 2 (two) times daily. 09/06/17 12/25/18  Naeemah Jasmer C, PA-C  ipratropium-albuterol (DUONEB) 0.5-2.5 (3) MG/3ML SOLN Take 3 mLs by nebulization every 4 (four) hours as needed. 10/22/18 10/22/18  Chase Picket, MD  montelukast (SINGULAIR) 10 MG tablet Take 1 tablet (10 mg total) by mouth at bedtime. 12/13/18 01/06/19   Melynda Ripple, MD    Family History Family History  Problem Relation Age of Onset   Healthy Mother    Healthy Father     Social History Social History   Tobacco Use   Smoking status: Former Smoker    Packs/day: 0.00   Smokeless tobacco: Never Used  Substance Use Topics   Alcohol use: Not Currently    Comment: former   Drug use: Yes    Types: Cocaine, Marijuana    Comment: last used cocaine today     Allergies   Patient has no known allergies.   Review of Systems Review of Systems  Constitutional: Negative for fever.  HENT: Negative for sore throat.   Respiratory: Negative for shortness of breath.   Cardiovascular: Negative for chest pain.  Gastrointestinal: Positive for abdominal pain and blood in stool. Negative for nausea, rectal pain and vomiting.  Genitourinary: Positive for dysuria. Negative for difficulty urinating, discharge, frequency, penile pain, penile swelling, scrotal swelling and testicular pain.  Skin: Negative for rash.  Neurological: Negative for dizziness, light-headedness and headaches.     Physical Exam Triage Vital Signs ED Triage Vitals  Enc Vitals Group     BP 03/30/19 1142 121/88     Pulse Rate 03/30/19 1142 90     Resp 03/30/19 1142 18     Temp 03/30/19 1142 98.2 F (36.8 C)     Temp Source 03/30/19 1142 Oral     SpO2 03/30/19 1142 99 %     Weight --      Height --      Head Circumference --      Peak Flow --      Pain Score 03/30/19 1139 7     Pain Loc --      Pain Edu? --      Excl. in [place]? --    No data found.  Updated Vital Signs BP 121/88 (BP Location: Left Arm)    Pulse 90    Temp 98.2 F (36.8 C) (Oral)    Resp 18    SpO2 99%   Visual Acuity Right Eye Distance:   Left Eye Distance:   Bilateral Distance:    Right Eye Near:   Left Eye Near:    Bilateral Near:     Physical Exam Vitals and nursing note reviewed.  Constitutional:      Appearance: He is well-developed.  HENT:     Head: Normocephalic  and atraumatic.  Eyes:     Conjunctiva/sclera: Conjunctivae normal.  Cardiovascular:     Rate and Rhythm: Normal rate and regular rhythm.     Heart sounds: No murmur.  Pulmonary:     Effort: Pulmonary effort is normal. No respiratory distress.     Breath sounds: Normal breath sounds.     Comments: Breathing comfortably at rest, CTABL, no wheezing, rales or other adventitious sounds auscultated Abdominal:     Palpations: Abdomen is soft.  Tenderness: There is abdominal tenderness.     Comments: Abdomen soft, nondistended, generalized tenderness throughout bilateral lower quadrants of abdomen, no focal tenderness, negative rebound, negative Rovsing, negative McBurney's  Genitourinary:    Comments: Patient is normal, glans without erythema or rash, no discharge noted in urethra Musculoskeletal:     Cervical back: Neck supple.  Skin:    General: Skin is warm and dry.  Neurological:     Mental Status: He is alert.      UC Treatments / Results  Labs (all labs ordered are listed, but only abnormal results are displayed) Labs Reviewed  POCT URINALYSIS DIP (DEVICE) - Abnormal; Notable for the following components:      Result Value   Hgb urine dipstick SMALL (*)    All other components within normal limits  CYTOLOGY, (ORAL, ANAL, URETHRAL) ANCILLARY ONLY    EKG   Radiology No results found.  Procedures Procedures (including critical care time)  Medications Ordered in UC Medications  cefTRIAXone (ROCEPHIN) injection 500 mg (500 mg Intramuscular Given 03/30/19 1235)    Initial Impression / Assessment and Plan / UC Course  I have reviewed the triage vital signs and the nursing notes.  Pertinent labs & imaging results that were available during my care of the patient were reviewed by me and considered in my medical decision making (see chart for details).     Patient has had persistent hemoglobin and urine.  Negative leuks and nitrites.  Will empirically retreat for  gonorrhea today given possible reexposure and retest for STDs.  Discussed with patient if having continued symptoms or continued hematuria recommended following up with urology.  May begin with primary care.  Abdominal pain/hematochezia-feels typical to prior ulcerative colitis flares.  Refilled sulfasalazine, initiating on prednisone x5 days.  Placed referral to gastroenterology for follow-up.  Discussed with patient importance of management of ulcerative colitis by gastroenterology, advised if not being followed by gastroenterology to follow-up with primary care.  Discussed strict return precautions. Patient verbalized understanding and is agreeable with plan.  Final Clinical Impressions(s) / UC Diagnoses   Final diagnoses:  Exposure to STD  Ulcerative colitis with rectal bleeding, unspecified location [facility])     Discharge Instructions     We retreated you for gonorrhea today, we are retesting as well Please refrain from intercourse x 1 week  Prednisone 20 mg twice daily for 5 days, with food for ulcerative colitis Continue sulfasalazine Follow up with gastroenterology for management of UC   ED Prescriptions    Medication Sig Dispense Auth. Provider   predniSONE (DELTASONE) 20 MG tablet Take 1 tablet (20 mg total) by mouth 2 (two) times daily with a meal for 5 days. 10 tablet Sherry Rogus C, PA-C   dicyclomine (BENTYL) 20 MG tablet Take 1 tablet (20 mg total) by mouth 4 (four) times daily -  before meals and at bedtime. As needed for abdominal cramping 20 tablet Jahmeir Geisen C, PA-C   sulfaSALAzine (AZULFIDINE) 500 MG tablet Take 2 tablets (1,000 mg total) by mouth 3 (three) times daily. 540 tablet Saydee Zolman, [place] C, PA-C     PDMP not reviewed this encounter.   Janith Lima, [place] 03/30/19 1517

## 2019-03-31 LAB — CYTOLOGY, (ORAL, ANAL, URETHRAL) ANCILLARY ONLY
Chlamydia: NEGATIVE
Neisseria Gonorrhea: NEGATIVE
Trichomonas: NEGATIVE

## 2019-04-14 ENCOUNTER — Other Ambulatory Visit: Payer: Self-pay

## 2019-04-14 ENCOUNTER — Encounter (HOSPITAL_COMMUNITY): Payer: Self-pay

## 2019-04-14 ENCOUNTER — Ambulatory Visit (HOSPITAL_COMMUNITY)
Admission: EM | Admit: 2019-04-14 | Discharge: 2019-04-14 | Disposition: A | Discharge status: Home / Self Care | Payer: Self-pay | Attending: Family Medicine | Admitting: Family Medicine

## 2019-04-14 DIAGNOSIS — Z760 Encounter for issue of repeat prescription: Secondary | ICD-10-CM

## 2019-04-14 DIAGNOSIS — K5190 Ulcerative colitis, unspecified, without complications: Secondary | ICD-10-CM

## 2019-04-14 MED ORDER — DICYCLOMINE HCL 20 MG PO TABS: 20.0000 mg | ORAL_TABLET | Freq: Three times a day (TID) | ORAL | 0 refills | Status: DC

## 2019-04-14 MED ORDER — SULFASALAZINE 500 MG PO TABS: 1000.0000 mg | ORAL_TABLET | Freq: Three times a day (TID) | ORAL | 2 refills | Status: DC

## 2019-04-14 NOTE — Discharge Instructions (Signed)
Please restart your sulfasalazine for your ulcerative colitis.  Please follow up with your primary care provider and/or gastroenterology for management of your ulcerative colitis.

## 2019-04-14 NOTE — ED Provider Notes (Signed)
[place]    CSN: [id number] Arrival date & time: 04/14/19  1508      History   Chief Complaint Chief Complaint  Patient presents with  . Abdominal Pain    HPI Jerome Irwin is a 39 y.o. male.   Jerome Irwin presents with complaints of chronic abdominal pain. Waxes and wanes, comes and goes. History of ulcerative colitis. Has not taken his sulfasalazine in the past two weeks or so as he ran out. Was seen here and provided a refill but he did not get it filled. Did complete a course of prednisone, which he feels did not help. Has approximately 2-3 bowel movements a day. Occasionally with some bleeding, sometimes wsith mucus to stool. Last colonoscopy in 2018. Hasn't been following with a PCP or with GI. No fevers or chills. No urinary symptoms. No nausea or vomiting.    ROS per HPI, negative if not otherwise mentioned.      Past Medical History:  Diagnosis Date  . Asthma   . Bronchitis   . Pneumonia   . Ulcerative colitis ([place])     There are no problems to display for this patient.   Past Surgical History:  Procedure Laterality Date  . COLONOSCOPY WITH PROPOFOL N/A 02/21/2016   Procedure: COLONOSCOPY WITH PROPOFOL;  Surgeon: Arta Silence, MD;  Location: [facility];  Service: Endoscopy;  Laterality: N/A;       Home Medications    Prior to Admission medications   Medication Sig Start Date End Date Taking? Authorizing Provider  albuterol (PROVENTIL HFA;VENTOLIN HFA) 108 (90 Base) MCG/ACT inhaler Inhale 1-2 puffs into the lungs every 6 (six) hours as needed for wheezing or shortness of breath. 09/06/17   Wieters, Hallie C, PA-C  albuterol (PROVENTIL) (2.5 MG/3ML) 0.083% nebulizer solution Take 3 mLs (2.5 mg total) by nebulization every 6 (six) hours as needed for wheezing or shortness of breath. 10/22/18   Chase Picket, MD  Budesonide 90 MCG/ACT inhaler Inhale 2 puffs into the lungs 2 (two) times daily. 11/13/18   Zigmund Gottron, NP    dicyclomine (BENTYL) 20 MG tablet Take 1 tablet (20 mg total) by mouth 4 (four) times daily -  before meals and at bedtime. As needed for abdominal cramping 04/14/19   Augusto Gamble B, NP  ipratropium-albuterol (DUONEB) 0.5-2.5 (3) MG/3ML SOLN SMARTSIG:1 Vial(s) Via Nebulizer 4 Times Daily PRN 01/26/19   [provider]  omeprazole (PRILOSEC) 20 MG capsule Take 1 capsule (20 mg total) by mouth daily. 11/27/18   Raylene Everts, MD  sulfaSALAzine (AZULFIDINE) 500 MG tablet Take 2 tablets (1,000 mg total) by mouth 3 (three) times daily. 04/14/19 07/13/19  Zigmund Gottron, NP  cetirizine (ZYRTEC) 5 MG tablet Take 2 tablets (10 mg total) by mouth daily. 08/30/18 10/29/18  Langston Masker B, PA-C  ferrous sulfate 325 (65 FE) MG EC tablet Take 325 mg by mouth 3 (three) times daily with meals.  10/29/18  [provider]  fluticasone (FLONASE) 50 MCG/ACT nasal spray Place 1 spray into both nostrils daily as needed for allergies or rhinitis. 08/30/18 01/06/19  Langston Masker B, PA-C  Fluticasone-Salmeterol (ADVAIR) 100-50 MCG/DOSE AEPB Inhale 1 puff into the lungs 2 (two) times daily. 09/06/17 12/25/18  Wieters, Hallie C, PA-C  montelukast (SINGULAIR) 10 MG tablet Take 1 tablet (10 mg total) by mouth at bedtime. 12/13/18 01/06/19  Melynda Ripple, MD    Family History Family History  Problem Relation Age of Onset  . Healthy Mother   .  Healthy Father     Social History Social History   Tobacco Use  . Smoking status: Former Smoker    Packs/day: 0.00  . Smokeless tobacco: Never Used  Substance Use Topics  . Alcohol use: Not Currently    Comment: former  . Drug use: Yes    Types: Cocaine, Marijuana    Comment: last used cocaine today     Allergies   Patient has no known allergies.   Review of Systems Review of Systems   Physical Exam Triage Vital Signs ED Triage Vitals  Enc Vitals Group     BP 04/14/19 1550 126/75     Pulse Rate 04/14/19 1550 97     Resp 04/14/19 1550  17     Temp 04/14/19 1550 99.3 F (37.4 C)     Temp Source 04/14/19 1550 Oral     SpO2 04/14/19 1550 95 %     Weight 04/14/19 1548 198 lb (89.8 kg)     Height --      Head Circumference --      Peak Flow --      Pain Score 04/14/19 1546 8     Pain Loc --      Pain Edu? --      Excl. in [place]? --    No data found.  Updated Vital Signs BP 126/75 (BP Location: Right Arm)   Pulse 97   Temp 99.3 F (37.4 C) (Oral)   Resp 17   Wt 198 lb (89.8 kg)   SpO2 95%   BMI 27.62 kg/m    Physical Exam Constitutional:      Appearance: He is well-developed.  Cardiovascular:     Rate and Rhythm: Normal rate.  Pulmonary:     Effort: Pulmonary effort is normal.  Abdominal:     Palpations: Abdomen is soft.     Tenderness: There is no guarding or rebound.     Comments: Very mild low abdominal tenderness  Skin:    General: Skin is warm and dry.  Neurological:     Mental Status: He is alert and oriented to person, place, and time.      UC Treatments / Results  Labs (all labs ordered are listed, but only abnormal results are displayed) Labs Reviewed - No data to display  EKG   Radiology No results found.  Procedures Procedures (including critical care time)  Medications Ordered in UC Medications - No data to display  Initial Impression / Assessment and Plan / UC Course  I have reviewed the triage vital signs and the nursing notes.  Pertinent labs & imaging results that were available during my care of the patient were reviewed by me and considered in my medical decision making (see chart for details).     Non toxic. Benign physical exam.  No indications of acute abdomen at this time. Stable BP. Encourage refill of maintenance medication sulfasalazine. Encouraged follow up with PCP and with GI for long term management to aid in symptom control. Patient verbalized understanding and agreeable to plan.   Final Clinical Impressions(s) / UC Diagnoses   Final diagnoses:    Ulcerative colitis without complications, unspecified location [facility])  Medication refill     Discharge Instructions     Please restart your sulfasalazine for your ulcerative colitis.  Please follow up with your primary care provider and/or gastroenterology for management of your ulcerative colitis.    ED Prescriptions    Medication Sig Dispense Auth. Provider   sulfaSALAzine (AZULFIDINE)  500 MG tablet Take 2 tablets (1,000 mg total) by mouth 3 (three) times daily. 180 tablet Augusto Gamble B, NP   dicyclomine (BENTYL) 20 MG tablet Take 1 tablet (20 mg total) by mouth 4 (four) times daily -  before meals and at bedtime. As needed for abdominal cramping 20 tablet Zigmund Gottron, NP     PDMP not reviewed this encounter.   Zigmund Gottron, NP 04/14/19 1620

## 2019-04-14 NOTE — ED Triage Notes (Addendum)
Pt is here with off & on abdominal pain that has been the same since his last visit, pt has not taken any meds to relieve discomfort.

## 2019-04-15 ENCOUNTER — Encounter: Payer: Self-pay | Admitting: Emergency Medicine

## 2019-04-18 ENCOUNTER — Other Ambulatory Visit: Payer: Self-pay

## 2019-04-18 ENCOUNTER — Ambulatory Visit (HOSPITAL_COMMUNITY)
Admission: EM | Admit: 2019-04-18 | Discharge: 2019-04-18 | Disposition: A | Discharge status: Home / Self Care | Payer: Self-pay | Attending: Family Medicine | Admitting: Family Medicine

## 2019-04-18 DIAGNOSIS — R5383 Other fatigue: Secondary | ICD-10-CM

## 2019-04-18 MED ORDER — ALBUTEROL SULFATE HFA 108 (90 BASE) MCG/ACT IN AERS
2.0000 | INHALATION_SPRAY | Freq: Once | RESPIRATORY_TRACT | Status: AC
  Administered 2019-04-18: 2 via RESPIRATORY_TRACT

## 2019-04-18 MED ORDER — ALBUTEROL SULFATE HFA 108 (90 BASE) MCG/ACT IN AERS: 2.0000 | INHALATION_SPRAY | RESPIRATORY_TRACT | 0 refills | Status: DC | PRN

## 2019-04-18 MED ORDER — ALBUTEROL SULFATE HFA 108 (90 BASE) MCG/ACT IN AERS
INHALATION_SPRAY | RESPIRATORY_TRACT | Status: AC
  Filled 2019-04-18: qty 6.7

## 2019-04-18 NOTE — ED Triage Notes (Signed)
Pt c/o SOBOE and general weakness/fatigue; pt admits to smoking marijuana. Also reports that his stomach "feels irritated". Pt unable to clarify what he means by irritated. Denies n/v/d, fever, chills or abdom pain. Pt finished eating entire pack of snack cookies as he was called to tx room. Pt states, " I have bronchitis all the time and ulcerative colitis, not sure which one is flaring up."  Sinus congestion noted, but pt states the marijuana he smoked this morning likely the cause.  Speaking full sentences w/o difficulty, reclining back on chair/bed with phone. No acute distress or complaint.

## 2019-04-18 NOTE — Discharge Instructions (Signed)
Increase fluid intake to 8-10 glasses per day.  I have sent an albuterol inhaler to your pharmacy.

## 2019-04-18 NOTE — ED Provider Notes (Signed)
[place]    CSN: [id number] Arrival date & time: 04/18/19  1147      History   Chief Complaint Chief Complaint  Patient presents with  . Shortness of Breath  . Fatigue    HPI Jerome Irwin is a 39 y.o. male.   Patient reports shortness of breath on exertion, also admits to smoking marijuana this morning and attributes his symptoms to this.  Reports that his stomach feels "irritated" but cannot elaborate on this feeling.  Patient has history of bronchitis and ulcerative colitis.  Patient denies having established GI relationship.  Patient reports that he always feels better when he takes steroids, and is requesting steroids at this time.  Patient denies cough, sore throat, shortness of breath at rest, nausea, vomiting, diarrhea, chills, body aches, rash, fever, other symptoms.  The history is provided by the patient.    Past Medical History:  Diagnosis Date  . Asthma   . Bronchitis   . Pneumonia   . Ulcerative colitis ([place])     There are no problems to display for this patient.   Past Surgical History:  Procedure Laterality Date  . COLONOSCOPY WITH PROPOFOL N/A 02/21/2016   Procedure: COLONOSCOPY WITH PROPOFOL;  Surgeon: Arta Silence, MD;  Location: [facility];  Service: Endoscopy;  Laterality: N/A;       Home Medications    Prior to Admission medications   Medication Sig Start Date End Date Taking? Authorizing Provider  dicyclomine (BENTYL) 20 MG tablet Take 1 tablet (20 mg total) by mouth 4 (four) times daily -  before meals and at bedtime. As needed for abdominal cramping 04/14/19  Yes Augusto Gamble B, NP  sulfaSALAzine (AZULFIDINE) 500 MG tablet Take 2 tablets (1,000 mg total) by mouth 3 (three) times daily. 04/14/19 07/13/19 Yes Burky, Malachy Moan, NP  albuterol (VENTOLIN HFA) 108 (90 Base) MCG/ACT inhaler Inhale 2 puffs into the lungs every 4 (four) hours as needed for wheezing or shortness of breath. 04/18/19   Faustino Congress, NP    Budesonide 90 MCG/ACT inhaler Inhale 2 puffs into the lungs 2 (two) times daily. 11/13/18   Zigmund Gottron, NP  ipratropium-albuterol (DUONEB) 0.5-2.5 (3) MG/3ML SOLN SMARTSIG:1 Vial(s) Via Nebulizer 4 Times Daily PRN 01/26/19   [provider]  omeprazole (PRILOSEC) 20 MG capsule Take 1 capsule (20 mg total) by mouth daily. 11/27/18   Raylene Everts, MD  cetirizine (ZYRTEC) 5 MG tablet Take 2 tablets (10 mg total) by mouth daily. 08/30/18 10/29/18  Langston Masker B, PA-C  ferrous sulfate 325 (65 FE) MG EC tablet Take 325 mg by mouth 3 (three) times daily with meals.  10/29/18  [provider]  fluticasone (FLONASE) 50 MCG/ACT nasal spray Place 1 spray into both nostrils daily as needed for allergies or rhinitis. 08/30/18 01/06/19  Langston Masker B, PA-C  Fluticasone-Salmeterol (ADVAIR) 100-50 MCG/DOSE AEPB Inhale 1 puff into the lungs 2 (two) times daily. 09/06/17 12/25/18  Wieters, Hallie C, PA-C  montelukast (SINGULAIR) 10 MG tablet Take 1 tablet (10 mg total) by mouth at bedtime. 12/13/18 01/06/19  Melynda Ripple, MD    Family History Family History  Problem Relation Age of Onset  . Healthy Mother   . Healthy Father     Social History Social History   Tobacco Use  . Smoking status: Former Smoker    Packs/day: 0.00  . Smokeless tobacco: Never Used  Substance Use Topics  . Alcohol use: Not Currently    Comment: former  .  Drug use: Yes    Types: Cocaine, Marijuana    Comment: last used cocaine today     Allergies   Patient has no known allergies.   Review of Systems Review of Systems  Constitutional: Positive for fatigue. Negative for chills and fever.  HENT: Negative.  Negative for ear pain and sore throat.   Eyes: Negative.  Negative for pain and visual disturbance.  Respiratory: Positive for shortness of breath. Negative for cough.        Shortness of breath on exertion since this morning  Cardiovascular: Negative.  Negative for chest pain and  palpitations.  Gastrointestinal: Negative for abdominal pain and vomiting.       Feels "irritated"  Endocrine: Negative.   Genitourinary: Negative.  Negative for dysuria and hematuria.  Musculoskeletal: Negative.  Negative for arthralgias and back pain.  Skin: Negative.  Negative for color change and rash.  Allergic/Immunologic: Negative.   Neurological: Negative.  Negative for seizures and syncope.  Hematological: Negative.   Psychiatric/Behavioral: Negative.   All other systems reviewed and are negative.    Physical Exam Triage Vital Signs ED Triage Vitals [04/18/19 1205]  Enc Vitals Group     BP 100/64     Pulse Rate 81     Resp 18     Temp 97.9 F (36.6 C)     Temp Source Oral     SpO2 97 %     Weight      Height      Head Circumference      Peak Flow      Pain Score 0     Pain Loc      Pain Edu?      Excl. in [place]?    No data found.  Updated Vital Signs BP 100/64 (BP Location: Left Arm)   Pulse 81   Temp 97.9 F (36.6 C) (Oral)   Resp 18   SpO2 97%   Visual Acuity Right Eye Distance:   Left Eye Distance:   Bilateral Distance:    Right Eye Near:   Left Eye Near:    Bilateral Near:     Physical Exam Vitals and nursing note reviewed.  Constitutional:      General: He is not in acute distress.    Appearance: He is well-developed and normal weight. He is not ill-appearing.  HENT:     Head: Normocephalic and atraumatic.     Nose: Nose normal.     Mouth/Throat:     Mouth: Mucous membranes are moist.     Pharynx: Oropharynx is clear.  Eyes:     Extraocular Movements: Extraocular movements intact.     Conjunctiva/sclera: Conjunctivae normal.     Pupils: Pupils are equal, round, and reactive to light.  Cardiovascular:     Rate and Rhythm: Normal rate and regular rhythm.     Heart sounds: Normal heart sounds. No murmur.  Pulmonary:     Effort: Pulmonary effort is normal. No respiratory distress.     Breath sounds: Normal breath sounds. No stridor. No  wheezing, rhonchi or rales.  Chest:     Chest wall: No tenderness.  Abdominal:     General: Bowel sounds are normal. There is no distension.     Palpations: Abdomen is soft. There is no mass.     Tenderness: There is no abdominal tenderness. There is no right CVA tenderness, left CVA tenderness, guarding or rebound.     Hernia: No hernia is present.  Musculoskeletal:  General: Normal range of motion.     Cervical back: Normal range of motion and neck supple.  Skin:    General: Skin is warm and dry.     Capillary Refill: Capillary refill takes less than 2 seconds.  Neurological:     General: No focal deficit present.     Mental Status: He is alert and oriented to person, place, and time.  Psychiatric:        Mood and Affect: Mood normal.        Behavior: Behavior normal.        Thought Content: Thought content normal.      UC Treatments / Results  Labs (all labs ordered are listed, but only abnormal results are displayed) Labs Reviewed - No data to display  EKG   Radiology No results found.  Procedures Procedures (including critical care time)  Medications Ordered in UC Medications  albuterol (VENTOLIN HFA) 108 (90 Base) MCG/ACT inhaler 2 puff (2 puffs Inhalation Given 04/18/19 1232)    Initial Impression / Assessment and Plan / UC Course  I have reviewed the triage vital signs and the nursing notes.  Pertinent labs & imaging results that were available during my care of the patient were reviewed by me and considered in my medical decision making (see chart for details).    Presents today for fatigue x2 days.  By the time I have arrived to to assessment, patient states that shortness of breath has resolved.  No wheezing noted on exam.  No abdominal tenderness noted on palpation.  No concern of nausea, vomiting, diarrhea.  Patient has diagnosis of ulcerative colitis but does not have GI relationship.  Discussed at length with patient that establishing GI  relationship would be the key to his treatment success with his ulcerative colitis.  Pressing for steroid prescription during this visit.  There is no wheezing and there is no tenderness, no abdominal symptoms, so steroids were not given this visit.  Patient states that he is out of his inhaler at home and feels like he is tight in his chest.  Albuterol inhaler 2 puffs given in office and inhaler sent home with patient.  Instructed that he really needs to establish GI relationship and follow-up with them for management of his ulcerative colitis.  If he feels that he needs more treatment that he is receiving today, patient is instructed that he needs to go to the emergency department for this. Final Clinical Impressions(s) / UC Diagnoses   Final diagnoses:  Other fatigue     Discharge Instructions     Increase fluid intake to 8-10 glasses per day.  I have sent an albuterol inhaler to your pharmacy.     ED Prescriptions    Medication Sig Dispense Auth. Provider   albuterol (VENTOLIN HFA) 108 (90 Base) MCG/ACT inhaler Inhale 2 puffs into the lungs every 4 (four) hours as needed for wheezing or shortness of breath. 18 g Faustino Congress, NP     PDMP not reviewed this encounter.   Faustino Congress, NP 04/18/19 2300

## 2019-05-03 ENCOUNTER — Emergency Department (HOSPITAL_COMMUNITY): Payer: Self-pay

## 2019-05-03 ENCOUNTER — Encounter (HOSPITAL_COMMUNITY): Payer: Self-pay

## 2019-05-03 ENCOUNTER — Other Ambulatory Visit: Payer: Self-pay

## 2019-05-03 ENCOUNTER — Emergency Department (HOSPITAL_COMMUNITY)
Admission: EM | Admit: 2019-05-03 | Discharge: 2019-05-03 | Disposition: A | Discharge status: Home / Self Care | Payer: Self-pay | Attending: Emergency Medicine | Admitting: Emergency Medicine

## 2019-05-03 DIAGNOSIS — Z5321 Procedure and treatment not carried out due to patient leaving prior to being seen by health care provider: Secondary | ICD-10-CM | POA: Insufficient documentation

## 2019-05-03 DIAGNOSIS — R0602 Shortness of breath: Secondary | ICD-10-CM | POA: Insufficient documentation

## 2019-05-03 NOTE — ED Triage Notes (Signed)
Patient arrived stating he has had bronchitis for about a month with complaints of some shortness of breath and congestion. Reports taking an albuterol inhaler about 15 minutes ago with no relief. Patient in no distress in triage.

## 2019-05-03 NOTE — ED Notes (Addendum)
Patient asked during triage about wait time stating he doesn't want to wait long, this nurse explained we do have a wait right now but encouraged patient to stay to be seen.

## 2019-05-03 NOTE — ED Notes (Signed)
Attempted to call pt for EKG in triage but pt was not in the lobby when called.

## 2019-05-22 ENCOUNTER — Other Ambulatory Visit: Payer: Self-pay

## 2019-05-22 ENCOUNTER — Ambulatory Visit (HOSPITAL_COMMUNITY)
Admission: EM | Admit: 2019-05-22 | Discharge: 2019-05-22 | Disposition: A | Discharge status: Home / Self Care | Payer: Self-pay | Attending: Urgent Care | Admitting: Urgent Care

## 2019-05-22 ENCOUNTER — Encounter (HOSPITAL_COMMUNITY): Payer: Self-pay

## 2019-05-22 DIAGNOSIS — Z202 Contact with and (suspected) exposure to infections with a predominantly sexual mode of transmission: Secondary | ICD-10-CM

## 2019-05-22 DIAGNOSIS — F1290 Cannabis use, unspecified, uncomplicated: Secondary | ICD-10-CM

## 2019-05-22 DIAGNOSIS — J302 Other seasonal allergic rhinitis: Secondary | ICD-10-CM

## 2019-05-22 DIAGNOSIS — Z7251 High risk heterosexual behavior: Secondary | ICD-10-CM

## 2019-05-22 DIAGNOSIS — R0981 Nasal congestion: Secondary | ICD-10-CM

## 2019-05-22 MED ORDER — PSEUDOEPHEDRINE HCL 60 MG PO TABS: 60.0000 mg | ORAL_TABLET | Freq: Two times a day (BID) | ORAL | 0 refills | Status: DC | PRN

## 2019-05-22 MED ORDER — CEFTRIAXONE SODIUM 500 MG IJ SOLR
500.0000 mg | Freq: Once | INTRAMUSCULAR | Status: AC
  Administered 2019-05-22: 500 mg via INTRAMUSCULAR

## 2019-05-22 MED ORDER — CEFTRIAXONE SODIUM 500 MG IJ SOLR
INTRAMUSCULAR | Status: AC
  Filled 2019-05-22: qty 500

## 2019-05-22 MED ORDER — LEVOCETIRIZINE DIHYDROCHLORIDE 5 MG PO TABS: 5.0000 mg | ORAL_TABLET | Freq: Every evening | ORAL | 0 refills | Status: DC

## 2019-05-22 NOTE — Discharge Instructions (Signed)
Avoid all forms of sexual intercourse (oral, vaginal, anal) for the next 7 days to avoid spreading/reinfecting. Return if symptoms worsen/do not resolve, you develop fever, abdominal pain, blood in your urine, or are re-exposed to an STI.

## 2019-05-22 NOTE — ED Provider Notes (Addendum)
[place]   MRN: [id number] DOB: [birth year]  Subjective:   Jerome Irwin is a 39 y.o. male presenting for exposure to gonorrhea. Denies any urinary symptoms, penile symptoms. Patient also has 2-day history of severe sinus congestion and postnasal drainage.  Smokes marijuana regularly.   Patient has a history of ulcerative colitis states that his stomach has been upset lately.  No current facility-administered medications for this encounter.  Current Outpatient Medications:  .  albuterol (VENTOLIN HFA) 108 (90 Base) MCG/ACT inhaler, Inhale 2 puffs into the lungs every 4 (four) hours as needed for wheezing or shortness of breath., Disp: 18 g, Rfl: 0 .  Budesonide 90 MCG/ACT inhaler, Inhale 2 puffs into the lungs 2 (two) times daily., Disp: 1 each, Rfl: 0 .  dicyclomine (BENTYL) 20 MG tablet, Take 1 tablet (20 mg total) by mouth 4 (four) times daily -  before meals and at bedtime. As needed for abdominal cramping, Disp: 20 tablet, Rfl: 0 .  ipratropium-albuterol (DUONEB) 0.5-2.5 (3) MG/3ML SOLN, SMARTSIG:1 Vial(s) Via Nebulizer 4 Times Daily PRN, Disp: , Rfl:  .  omeprazole (PRILOSEC) 20 MG capsule, Take 1 capsule (20 mg total) by mouth daily., Disp: 30 capsule, Rfl: 0 .  sulfaSALAzine (AZULFIDINE) 500 MG tablet, Take 2 tablets (1,000 mg total) by mouth 3 (three) times daily., Disp: 180 tablet, Rfl: 2   No Known Allergies  Past Medical History:  Diagnosis Date  . Asthma   . Bronchitis   . Pneumonia   . Ulcerative colitis [facility])      Past Surgical History:  Procedure Laterality Date  . COLONOSCOPY WITH PROPOFOL N/A 02/21/2016   Procedure: COLONOSCOPY WITH PROPOFOL;  Surgeon: Arta Silence, MD;  Location: [facility];  Service: Endoscopy;  Laterality: N/A;    Family History  Problem Relation Age of Onset  . Healthy Mother   . Healthy Father     Social History   Tobacco Use  . Smoking status: Former Smoker    Packs/day: 0.00  . Smokeless tobacco: Never Used    Substance Use Topics  . Alcohol use: Not Currently    Comment: former  . Drug use: Yes    Types: Cocaine, Marijuana    Comment: last used cocaine today    ROS   Objective:   Vitals: BP (!) 146/87 (BP Location: Left Arm)   Pulse 86   Temp 97.9 F (36.6 C) (Oral)   Resp 18   Wt 195 lb 9.6 oz (88.7 kg)   SpO2 98%   BMI 27.28 kg/m   Physical Exam Constitutional:      General: He is not in acute distress.    Appearance: Normal appearance. He is well-developed. He is not ill-appearing, toxic-appearing or diaphoretic.  HENT:     Head: Normocephalic and atraumatic.     Right Ear: External ear normal.     Left Ear: External ear normal.     Nose: Congestion and rhinorrhea present.     Mouth/Throat:     Mouth: Mucous membranes are moist.  Eyes:     General: No scleral icterus.    Extraocular Movements: Extraocular movements intact.     Pupils: Pupils are equal, round, and reactive to light.  Cardiovascular:     Rate and Rhythm: Normal rate and regular rhythm.     Heart sounds: Normal heart sounds. No murmur. No friction rub. No gallop.   Pulmonary:     Effort: Pulmonary effort is normal. No respiratory distress.     Breath  sounds: Normal breath sounds. No stridor. No wheezing, rhonchi or rales.  Abdominal:     General: Bowel sounds are normal. There is no distension.     Palpations: Abdomen is soft. There is no mass.     Tenderness: There is no abdominal tenderness. There is no guarding or rebound.  Skin:    General: Skin is warm and dry.  Neurological:     Mental Status: He is alert and oriented to person, place, and time.  Psychiatric:        Mood and Affect: Mood normal.        Behavior: Behavior normal.        Thought Content: Thought content normal.        Judgment: Judgment normal.      Assessment and Plan :   PDMP not reviewed this encounter.  1. Exposure to gonorrhea   2. Unprotected sex   3. Nasal congestion   4. Seasonal allergic rhinitis,  unspecified trigger   5. Marijuana smoker     Empiric treatment with ceftriaxone in clinic. STI testing pending. Start Xyzal, Sudafed to help with nasal congestion. Recommended smoking cessation.  Counseled patient that he needs follow-up with his GI doctor for his ulcerative colitis and upset stomach. Counseled patient on potential for adverse effects with medications prescribed/recommended today, ER and return-to-clinic precautions discussed, patient verbalized understanding.    Jaynee Eagles, PA-C 05/22/19 1735   UPDATE: I misunderstood clinical staff on sample collection prior to my visit with the patient.  It turns out, patient never provided a cytology swab.  Clinical staff members are attempting to contact patient to return to clinic for obtaining a cytology swab.   Jaynee Eagles, PA-C 05/22/19 1736

## 2019-05-22 NOTE — ED Triage Notes (Signed)
Pt is here wanting STD testing after finding out 4 days ago that his partner tested POSITIVE for Gonorrhea.

## 2019-06-16 ENCOUNTER — Ambulatory Visit (HOSPITAL_COMMUNITY)
Admission: EM | Admit: 2019-06-16 | Discharge: 2019-06-16 | Disposition: A | Discharge status: Home / Self Care | Payer: Self-pay | Attending: Family Medicine | Admitting: Family Medicine

## 2019-06-16 ENCOUNTER — Encounter (HOSPITAL_COMMUNITY): Payer: Self-pay

## 2019-06-16 ENCOUNTER — Other Ambulatory Visit: Payer: Self-pay

## 2019-06-16 DIAGNOSIS — J4541 Moderate persistent asthma with (acute) exacerbation: Secondary | ICD-10-CM

## 2019-06-16 DIAGNOSIS — K51919 Ulcerative colitis, unspecified with unspecified complications: Secondary | ICD-10-CM

## 2019-06-16 MED ORDER — ALBUTEROL SULFATE HFA 108 (90 BASE) MCG/ACT IN AERS
2.0000 | INHALATION_SPRAY | Freq: Once | RESPIRATORY_TRACT | Status: AC
  Administered 2019-06-16: 2 via RESPIRATORY_TRACT

## 2019-06-16 MED ORDER — ALBUTEROL SULFATE HFA 108 (90 BASE) MCG/ACT IN AERS
INHALATION_SPRAY | RESPIRATORY_TRACT | Status: AC
  Filled 2019-06-16: qty 6.7

## 2019-06-16 MED ORDER — PREDNISONE 10 MG (48) PO TBPK: ORAL_TABLET | ORAL | 0 refills | Status: DC

## 2019-06-16 NOTE — ED Triage Notes (Signed)
Patient reports ulcerative colitis flare-up and shortness of breath on exertion x 2 weeks. Pt reports mucus in the stools.  Reports steroids makes him feel better.

## 2019-06-19 NOTE — ED Provider Notes (Signed)
[place]   [id number] 06/16/19 Arrival Time: 2423  ASSESSMENT & PLAN:  1. Moderate persistent asthma with acute exacerbation   2. Ulcerative colitis with complication, unspecified location [facility])     Feels he is catching both of these early. Reports prednisone has helped in the past. Overall benign exam.  Begin: Meds ordered this encounter  Medications  . albuterol (VENTOLIN HFA) 108 (90 Base) MCG/ACT inhaler 2 puff  . predniSONE (STERAPRED UNI-PAK 48 TAB) 10 MG (48) TBPK tablet    Sig: Take as directed.    Dispense:  48 tablet    Refill:  0   . Follow-up Information    Placey, Audrea Muscat, NP.   Why: As needed. Contact information: [address] [place] [address] [phone]           Reviewed expectations re: course of current medical issues. Questions answered. Outlined signs and symptoms indicating need for more acute intervention. Understanding verbalized. After Visit Summary given.   SUBJECTIVE: History from: patient. Jerome Irwin is a 39 y.o. male with a h/o asthma and ulcerative colitis reports recent intermittent wheezing and lower abdominal discomfort. Probably over the past week or two. "I know it's my asthma and UC starting to flare up". Afebrile. Mucous in stools; no blood. Recent travel: none. Denies: runny nose, congestion, sore throat, difficulty breathing and headache. Normal PO intake without n/v/d. Ambulatory without difficulty.    OBJECTIVE:  Vitals:   06/16/19 1555  BP: 129/76  Pulse: 93  Resp: 18  Temp: 99.1 F (37.3 C)  TempSrc: Oral  SpO2: 97%    General appearance: alert; no distress Eyes: PERRLA; EOMI; conjunctiva normal HENT: [place]; AT; nasal mucosa normal; oral mucosa normal Neck: supple  Lungs: speaks full sentences without difficulty; unlabored; mild exp wheezing Abd: soft; no specific TTP; no guarding Extremities: no edema Skin: warm and dry Neurologic: normal gait Psychological: alert and  cooperative; normal mood and affect    No Known Allergies  Past Medical History:  Diagnosis Date  . Asthma   . Bronchitis   . Pneumonia   . Ulcerative colitis ([place])    Social History   Socioeconomic History  . Marital status: Single    Spouse name: Not on file  . Number of children: Not on file  . Years of education: Not on file  . Highest education level: Not on file  Occupational History  . Not on file  Tobacco Use  . Smoking status: Former Smoker    Packs/day: 0.00  . Smokeless tobacco: Never Used  Substance and Sexual Activity  . Alcohol use: Not Currently    Comment: former  . Drug use: Yes    Types: Cocaine, Marijuana    Comment: last used cocaine today  . Sexual activity: Yes    Birth control/protection: None  Other Topics Concern  . Not on file  Social History Narrative  . Not on file   Social Determinants of Health   Financial Resource Strain:   . Difficulty of Paying Living Expenses:   Food Insecurity:   . Worried About Charity fundraiser in the Last Year:   . Arboriculturist in the Last Year:   Transportation Needs:   . Film/video editor (Medical):   Marland Kitchen Lack of Transportation (Non-Medical):   Physical Activity:   . Days of Exercise per Week:   . Minutes of Exercise per Session:   Stress:   . Feeling of Stress :   Social  Connections:   . Frequency of Communication with Friends and Family:   . Frequency of Social Gatherings with Friends and Family:   . Attends Religious Services:   . Active Member of Clubs or Organizations:   . Attends Archivist Meetings:   Marland Kitchen Marital Status:   Intimate Partner Violence:   . Fear of Current or Ex-Partner:   . Emotionally Abused:   Marland Kitchen Physically Abused:   . Sexually Abused:    Family History  Problem Relation Age of Onset  . Healthy Mother   . Healthy Father    Past Surgical History:  Procedure Laterality Date  . COLONOSCOPY WITH PROPOFOL N/A 02/21/2016   Procedure: COLONOSCOPY WITH  PROPOFOL;  Surgeon: Arta Silence, MD;  Location: [facility];  Service: Endoscopy;  Laterality: N/AVanessa Kick, MD 06/19/19 1149

## 2019-07-29 ENCOUNTER — Other Ambulatory Visit: Payer: Self-pay

## 2019-07-29 ENCOUNTER — Encounter (HOSPITAL_COMMUNITY): Payer: Self-pay

## 2019-07-29 ENCOUNTER — Ambulatory Visit (HOSPITAL_COMMUNITY)
Admission: EM | Admit: 2019-07-29 | Discharge: 2019-07-29 | Disposition: A | Discharge status: Home / Self Care | Payer: Self-pay | Attending: Emergency Medicine | Admitting: Emergency Medicine

## 2019-07-29 DIAGNOSIS — R1084 Generalized abdominal pain: Secondary | ICD-10-CM

## 2019-07-29 DIAGNOSIS — J4520 Mild intermittent asthma, uncomplicated: Secondary | ICD-10-CM

## 2019-07-29 DIAGNOSIS — K51919 Ulcerative colitis, unspecified with unspecified complications: Secondary | ICD-10-CM

## 2019-07-29 MED ORDER — ALBUTEROL SULFATE HFA 108 (90 BASE) MCG/ACT IN AERS
INHALATION_SPRAY | RESPIRATORY_TRACT | Status: AC
  Filled 2019-07-29: qty 6.7

## 2019-07-29 MED ORDER — DEXAMETHASONE SODIUM PHOSPHATE 10 MG/ML IJ SOLN
10.0000 mg | Freq: Once | INTRAMUSCULAR | Status: AC
  Administered 2019-07-29: 10 mg via INTRAMUSCULAR

## 2019-07-29 MED ORDER — PREDNISONE 10 MG PO TABS: ORAL_TABLET | ORAL | 0 refills | Status: DC

## 2019-07-29 MED ORDER — ALBUTEROL SULFATE HFA 108 (90 BASE) MCG/ACT IN AERS
1.0000 | INHALATION_SPRAY | Freq: Once | RESPIRATORY_TRACT | Status: AC
  Administered 2019-07-29: 2 via RESPIRATORY_TRACT

## 2019-07-29 MED ORDER — DICYCLOMINE HCL 20 MG PO TABS: 20.0000 mg | ORAL_TABLET | Freq: Three times a day (TID) | ORAL | 0 refills | Status: DC

## 2019-07-29 MED ORDER — DEXAMETHASONE SODIUM PHOSPHATE 10 MG/ML IJ SOLN
INTRAMUSCULAR | Status: AC
  Filled 2019-07-29: qty 1

## 2019-07-29 NOTE — ED Triage Notes (Signed)
Pt presents with some generalized abdominal cramping X 1 week with no complaints of N/V/D

## 2019-07-29 NOTE — Discharge Instructions (Addendum)
We gave you an injection of Decadron today to help with asthma/UC, albuterol inhaler as needed for shortness of breath, wheezing. Continue with prednisone taper over the next 6 days-begin with 6 tablets on day one and decrease by 1 tablet each day until complete-six, five, four, three, two, one-take with food and in the morning if you're able Use Bentyl/dicyclomine before meals and bedtime as needed for cramping Please follow-up if any symptoms not improving or worsening

## 2019-07-29 NOTE — ED Provider Notes (Signed)
[facility]    CSN: [id number] Arrival date & time: 07/29/19  1502      History   Chief Complaint Chief Complaint  Patient presents with  . Abdominal Pain    HPI Jerome Irwin is a 39 y.o. male history of marijuana use, ulcerative colitis, presenting today for evaluation of abdominal cramping and wheezing. Patient reports that over the past week he has had increased abdominal cramping of recently. Attributes this to his ulcerative colitis and feels similar to prior flares. Believes this increased with the increase in heat of recently. Has felt slightly constipated, denies diarrhea. Denies any nausea or vomiting. Reports improvement previously with prednisone and Bentyl. Does not follow-up with GI for this and has been seen at the urgent care many times with prednisone prescriptions. He verbalizes understanding of chronic use of prednisone and wishes to proceed.  He also has had increased shortness of breath with some occasional wheezing. Has ran out of his albuterol inhaler.  HPI  Past Medical History:  Diagnosis Date  . Asthma   . Bronchitis   . Pneumonia   . Ulcerative colitis ([place])     There are no problems to display for this patient.   Past Surgical History:  Procedure Laterality Date  . COLONOSCOPY WITH PROPOFOL N/A 02/21/2016   Procedure: COLONOSCOPY WITH PROPOFOL;  Surgeon: Arta Silence, MD;  Location: [facility];  Service: Endoscopy;  Laterality: N/A;       Home Medications    Prior to Admission medications   Medication Sig Start Date End Date Taking? Authorizing Provider  albuterol (VENTOLIN HFA) 108 (90 Base) MCG/ACT inhaler Inhale 2 puffs into the lungs every 4 (four) hours as needed for wheezing or shortness of breath. 04/18/19   Faustino Congress, NP  Budesonide 90 MCG/ACT inhaler Inhale 2 puffs into the lungs 2 (two) times daily. 11/13/18   Zigmund Gottron, NP  dicyclomine (BENTYL) 20 MG tablet Take 1 tablet (20 mg total) by mouth 4  (four) times daily -  before meals and at bedtime. As needed for abdominal cramping 07/29/19   Tayshon Winker, [employer] C, PA-C  ipratropium-albuterol (DUONEB) 0.5-2.5 (3) MG/3ML SOLN SMARTSIG:1 Vial(s) Via Nebulizer 4 Times Daily PRN 01/26/19   [provider]  levocetirizine (XYZAL) 5 MG tablet Take 1 tablet (5 mg total) by mouth every evening. 05/22/19   Jaynee Eagles, PA-C  omeprazole (PRILOSEC) 20 MG capsule Take 1 capsule (20 mg total) by mouth daily. 11/27/18   Raylene Everts, MD  predniSONE (DELTASONE) 10 MG tablet Begin with 6 tabs on day 1, 5 tab on day 2, 4 tab on day 3, 3 tab on day 4, 2 tab on day 5, 1 tab on day 6-take with food 07/29/19   Elva Breaker C, PA-C  pseudoephedrine (SUDAFED) 60 MG tablet Take 1 tablet (60 mg total) by mouth 2 (two) times daily as needed for congestion. 05/22/19   Jaynee Eagles, PA-C  sulfaSALAzine (AZULFIDINE) 500 MG tablet Take 2 tablets (1,000 mg total) by mouth 3 (three) times daily. 04/14/19 07/13/19  Zigmund Gottron, NP  cetirizine (ZYRTEC) 5 MG tablet Take 2 tablets (10 mg total) by mouth daily. 08/30/18 10/29/18  Langston Masker B, PA-C  ferrous sulfate 325 (65 FE) MG EC tablet Take 325 mg by mouth 3 (three) times daily with meals.  10/29/18  [provider]  fluticasone (FLONASE) 50 MCG/ACT nasal spray Place 1 spray into both nostrils daily as needed for allergies or rhinitis. 08/30/18 01/06/19  Valere Dross,  Alyssa B, PA-C  Fluticasone-Salmeterol (ADVAIR) 100-50 MCG/DOSE AEPB Inhale 1 puff into the lungs 2 (two) times daily. 09/06/17 12/25/18  Zania Kalisz C, PA-C  montelukast (SINGULAIR) 10 MG tablet Take 1 tablet (10 mg total) by mouth at bedtime. 12/13/18 01/06/19  Melynda Ripple, MD    Family History Family History  Problem Relation Age of Onset  . Healthy Mother   . Healthy Father     Social History Social History   Tobacco Use  . Smoking status: Former Smoker    Packs/day: 0.00  . Smokeless tobacco: Never Used  Vaping Use  . Vaping  Use: Never used  Substance Use Topics  . Alcohol use: Not Currently    Comment: former  . Drug use: Yes    Types: Cocaine, Marijuana    Comment: last used cocaine today     Allergies   Patient has no known allergies.   Review of Systems Review of Systems  Constitutional: Negative for activity change, appetite change, chills, fatigue and fever.  HENT: Negative for congestion, ear pain, rhinorrhea, sinus pressure, sore throat and trouble swallowing.   Eyes: Negative for discharge and redness.  Respiratory: Positive for shortness of breath and wheezing. Negative for cough and chest tightness.   Cardiovascular: Negative for chest pain.  Gastrointestinal: Positive for abdominal pain. Negative for diarrhea, nausea and vomiting.  Musculoskeletal: Negative for myalgias.  Skin: Negative for rash.  Neurological: Negative for dizziness, light-headedness and headaches.     Physical Exam Triage Vital Signs ED Triage Vitals  Enc Vitals Group     BP 07/29/19 1523 (!) 122/110     Pulse Rate 07/29/19 1523 85     Resp 07/29/19 1523 17     Temp 07/29/19 1523 98.9 F (37.2 C)     Temp Source 07/29/19 1523 Oral     SpO2 07/29/19 1523 94 %     Weight --      Height --      Head Circumference --      Peak Flow --      Pain Score 07/29/19 1522 5     Pain Loc --      Pain Edu? --      Excl. in [place]? --    No data found.  Updated Vital Signs BP 129/76 (BP Location: Right Arm) Comment: re-check   Pulse 85   Temp 98.9 F (37.2 C) (Oral)   Resp 17   SpO2 94%   Visual Acuity Right Eye Distance:   Left Eye Distance:   Bilateral Distance:    Right Eye Near:   Left Eye Near:    Bilateral Near:     Physical Exam Vitals and nursing note reviewed.  Constitutional:      Appearance: He is well-developed.     Comments: No acute distress  HENT:     Head: Normocephalic and atraumatic.     Nose: Nose normal.     Mouth/Throat:     Comments: Oral mucosa pink and moist, no tonsillar  enlargement or exudate. Posterior pharynx patent and nonerythematous, no uvula deviation or swelling. Normal phonation. Eyes:     Conjunctiva/sclera: Conjunctivae normal.  Cardiovascular:     Rate and Rhythm: Normal rate.  Pulmonary:     Effort: Pulmonary effort is normal. No respiratory distress.     Comments: Breathing comfortably at rest, CTABL, mild expiratory wheezing noted in bilateral lung fields Abdominal:     General: There is no distension.     Comments:  Soft, nondistended, generalized tenderness throughout abdomen, more focal to epigastrium and left lower quadrant, negative rebound, negative Rovsing, negative McBurney's, negative Murphy's  Musculoskeletal:        General: Normal range of motion.     Cervical back: Neck supple.  Skin:    General: Skin is warm and dry.  Neurological:     Mental Status: He is alert and oriented to person, place, and time.      UC Treatments / Results  Labs (all labs ordered are listed, but only abnormal results are displayed) Labs Reviewed - No data to display  EKG   Radiology No results found.  Procedures Procedures (including critical care time)  Medications Ordered in UC Medications  dexamethasone (DECADRON) injection 10 mg (has no administration in time range)  albuterol (VENTOLIN HFA) 108 (90 Base) MCG/ACT inhaler 1-2 puff (has no administration in time range)    Initial Impression / Assessment and Plan / UC Course  I have reviewed the triage vital signs and the nursing notes.  Pertinent labs & imaging results that were available during my care of the patient were reviewed by me and considered in my medical decision making (see chart for details).     1. Abdominal cramping-secondary to ulcerative colitis, refilling Bentyl to use as needed, 6-day course of prednisone. Discussed importance of having GI follow-up with ulcerative colitis.  2. Asthma-mild wheezing noted in lungs, providing Decadron, prednisone course and  albuterol inhaler.  Discussed strict return precautions. Patient verbalized understanding and is agreeable with plan.  Final Clinical Impressions(s) / UC Diagnoses   Final diagnoses:  Generalized abdominal pain  Ulcerative colitis with complication, unspecified location ([place])  Mild intermittent asthma without complication     Discharge Instructions     We gave you an injection of Decadron today to help with asthma/UC, albuterol inhaler as needed for shortness of breath, wheezing. Continue with prednisone taper over the next 6 days-begin with 6 tablets on day one and decrease by 1 tablet each day until complete-six, five, four, three, two, one-take with food and in the morning if you're able Use Bentyl/dicyclomine before meals and bedtime as needed for cramping Please follow-up if any symptoms not improving or worsening   ED Prescriptions    Medication Sig Dispense Auth. Provider   predniSONE (DELTASONE) 10 MG tablet Begin with 6 tabs on day 1, 5 tab on day 2, 4 tab on day 3, 3 tab on day 4, 2 tab on day 5, 1 tab on day 6-take with food 21 tablet Chanetta Moosman C, PA-C   dicyclomine (BENTYL) 20 MG tablet Take 1 tablet (20 mg total) by mouth 4 (four) times daily -  before meals and at bedtime. As needed for abdominal cramping 60 tablet Jamilynn Whitacre C, PA-C     PDMP not reviewed this encounter.   Janith Lima, [place] 07/29/19 1548

## 2019-10-15 ENCOUNTER — Encounter (HOSPITAL_COMMUNITY): Payer: Self-pay | Admitting: Emergency Medicine

## 2019-10-15 ENCOUNTER — Other Ambulatory Visit: Payer: Self-pay

## 2019-10-15 ENCOUNTER — Ambulatory Visit (HOSPITAL_COMMUNITY)
Admission: EM | Admit: 2019-10-15 | Discharge: 2019-10-15 | Disposition: A | Discharge status: Home / Self Care | Payer: Self-pay | Attending: Internal Medicine | Admitting: Internal Medicine

## 2019-10-15 DIAGNOSIS — Z87891 Personal history of nicotine dependence: Secondary | ICD-10-CM | POA: Insufficient documentation

## 2019-10-15 DIAGNOSIS — Z20822 Contact with and (suspected) exposure to covid-19: Secondary | ICD-10-CM | POA: Insufficient documentation

## 2019-10-15 DIAGNOSIS — Z79899 Other long term (current) drug therapy: Secondary | ICD-10-CM | POA: Insufficient documentation

## 2019-10-15 DIAGNOSIS — J4540 Moderate persistent asthma, uncomplicated: Secondary | ICD-10-CM

## 2019-10-15 DIAGNOSIS — R109 Unspecified abdominal pain: Secondary | ICD-10-CM

## 2019-10-15 DIAGNOSIS — Z760 Encounter for issue of repeat prescription: Secondary | ICD-10-CM | POA: Insufficient documentation

## 2019-10-15 DIAGNOSIS — K51919 Ulcerative colitis, unspecified with unspecified complications: Secondary | ICD-10-CM

## 2019-10-15 LAB — SARS CORONAVIRUS 2 (TAT 6-24 HRS): SARS Coronavirus 2: NEGATIVE

## 2019-10-15 MED ORDER — ALBUTEROL SULFATE HFA 108 (90 BASE) MCG/ACT IN AERS
2.0000 | INHALATION_SPRAY | Freq: Once | RESPIRATORY_TRACT | Status: AC
  Administered 2019-10-15: 2 via RESPIRATORY_TRACT

## 2019-10-15 MED ORDER — ALBUTEROL SULFATE HFA 108 (90 BASE) MCG/ACT IN AERS
INHALATION_SPRAY | RESPIRATORY_TRACT | Status: AC
  Filled 2019-10-15: qty 6.7

## 2019-10-15 MED ORDER — ALBUTEROL SULFATE HFA 108 (90 BASE) MCG/ACT IN AERS: 2.0000 | INHALATION_SPRAY | RESPIRATORY_TRACT | 0 refills | Status: DC | PRN

## 2019-10-15 MED ORDER — PREDNISONE 10 MG (21) PO TBPK: ORAL_TABLET | Freq: Every day | ORAL | 0 refills | Status: DC

## 2019-10-15 MED ORDER — BUDESONIDE 90 MCG/ACT IN AEPB: 2.0000 | INHALATION_SPRAY | Freq: Two times a day (BID) | RESPIRATORY_TRACT | 0 refills | Status: DC

## 2019-10-15 MED ORDER — DICYCLOMINE HCL 20 MG PO TABS: 20.0000 mg | ORAL_TABLET | Freq: Three times a day (TID) | ORAL | 0 refills | Status: DC

## 2019-10-15 MED ORDER — SULFASALAZINE 500 MG PO TABS: 1000.0000 mg | ORAL_TABLET | Freq: Three times a day (TID) | ORAL | 0 refills | Status: DC

## 2019-10-15 NOTE — ED Notes (Signed)
Pt went to out make a phone call. Waiting for the pt for gives medications.

## 2019-10-15 NOTE — Discharge Instructions (Signed)
Please restart your daily maintenance inhaler.  Albuterol prn.  Restart your medications for UC.  Please follow up with your PCP and gastroenterology for long term management.  Continue to decrease smoking to quit, as able.

## 2019-10-15 NOTE — ED Provider Notes (Signed)
[facility]    CSN: [id number] Arrival date & time: 10/15/19  0813      History   Chief Complaint Chief Complaint  Patient presents with  . Bronchitis  . Medication Refill    HPI Jerome Irwin is a 39 y.o. male.   Jerome Irwin presents with complaints of abdominal cramping, diarrhea, as well as wheezes. He is out of his sulfasalazine. He is out of his inhalers as well. Smokes marijuana regularly. He has visited this urgent care with same complaints on multiple occasions. He doesn't follow up regularly with a PCP or with gastroenterology related to his UC. History of asthma.     ROS per HPI, negative if not otherwise mentioned.      Past Medical History:  Diagnosis Date  . Asthma   . Bronchitis   . Pneumonia   . Ulcerative colitis ([place])     There are no problems to display for this patient.   Past Surgical History:  Procedure Laterality Date  . COLONOSCOPY WITH PROPOFOL N/A 02/21/2016   Procedure: COLONOSCOPY WITH PROPOFOL;  Surgeon: Arta Silence, MD;  Location: [facility];  Service: Endoscopy;  Laterality: N/A;       Home Medications    Prior to Admission medications   Medication Sig Start Date End Date Taking? Authorizing Provider  albuterol (VENTOLIN HFA) 108 (90 Base) MCG/ACT inhaler Inhale 2 puffs into the lungs every 4 (four) hours as needed for wheezing or shortness of breath. 10/15/19   Zigmund Gottron, NP  Budesonide 90 MCG/ACT inhaler Inhale 2 puffs into the lungs 2 (two) times daily. 10/15/19   Zigmund Gottron, NP  dicyclomine (BENTYL) 20 MG tablet Take 1 tablet (20 mg total) by mouth 4 (four) times daily -  before meals and at bedtime. As needed for abdominal cramping 10/15/19   Jerome Gamble B, NP  ipratropium-albuterol (DUONEB) 0.5-2.5 (3) MG/3ML SOLN SMARTSIG:1 Vial(s) Via Nebulizer 4 Times Daily PRN 01/26/19   [provider]  levocetirizine (XYZAL) 5 MG tablet Take 1 tablet (5 mg total) by mouth every evening.  05/22/19   Jaynee Eagles, PA-C  omeprazole (PRILOSEC) 20 MG capsule Take 1 capsule (20 mg total) by mouth daily. 11/27/18   Raylene Everts, MD  predniSONE (STERAPRED UNI-PAK 21 TAB) 10 MG (21) TBPK tablet Take by mouth daily. Per box instruction 10/15/19   Zigmund Gottron, NP  pseudoephedrine (SUDAFED) 60 MG tablet Take 1 tablet (60 mg total) by mouth 2 (two) times daily as needed for congestion. 05/22/19   Jaynee Eagles, PA-C  sulfaSALAzine (AZULFIDINE) 500 MG tablet Take 2 tablets (1,000 mg total) by mouth 3 (three) times daily. 10/15/19 11/14/19  Zigmund Gottron, NP  cetirizine (ZYRTEC) 5 MG tablet Take 2 tablets (10 mg total) by mouth daily. 08/30/18 10/29/18  Jerome Masker B, PA-C  ferrous sulfate 325 (65 FE) MG EC tablet Take 325 mg by mouth 3 (three) times daily with meals.  10/29/18  [provider]  fluticasone (FLONASE) 50 MCG/ACT nasal spray Place 1 spray into both nostrils daily as needed for allergies or rhinitis. 08/30/18 01/06/19  Jerome Masker B, PA-C  Fluticasone-Salmeterol (ADVAIR) 100-50 MCG/DOSE AEPB Inhale 1 puff into the lungs 2 (two) times daily. 09/06/17 12/25/18  Wieters, Hallie C, PA-C  montelukast (SINGULAIR) 10 MG tablet Take 1 tablet (10 mg total) by mouth at bedtime. 12/13/18 01/06/19  Jerome Ripple, MD    Family History Family History  Problem Relation Age of Onset  . Healthy  Mother   . Healthy Father     Social History Social History   Tobacco Use  . Smoking status: Former Smoker    Packs/day: 0.00  . Smokeless tobacco: Never Used  Vaping Use  . Vaping Use: Never used  Substance Use Topics  . Alcohol use: Not Currently    Comment: former  . Drug use: Yes    Types: Cocaine, Marijuana    Comment: last used cocaine today     Allergies   Patient has no known allergies.   Review of Systems Review of Systems   Physical Exam Triage Vital Signs ED Triage Vitals  Enc Vitals Group     BP 10/15/19 0918 129/87     Pulse Rate 10/15/19 0918 77       Resp 10/15/19 0918 16     Temp 10/15/19 0918 98.1 F (36.7 C)     Temp Source 10/15/19 0918 Oral     SpO2 10/15/19 0918 98 %     Weight --      Height --      Head Circumference --      Peak Flow --      Pain Score 10/15/19 0913 8     Pain Loc --      Pain Edu? --      Excl. in [place]? --    No data found.  Updated Vital Signs BP 129/87 (BP Location: Left Arm)   Pulse 77   Temp 98.1 F (36.7 C) (Oral)   Resp 16   SpO2 98%   Visual Acuity Right Eye Distance:   Left Eye Distance:   Bilateral Distance:    Right Eye Near:   Left Eye Near:    Bilateral Near:     Physical Exam Constitutional:      Appearance: He is well-developed.  Cardiovascular:     Rate and Rhythm: Normal rate.  Pulmonary:     Effort: Pulmonary effort is normal.     Breath sounds: Wheezing present.  Skin:    General: Skin is warm and dry.  Neurological:     Mental Status: He is alert and oriented to person, place, and time.      UC Treatments / Results  Labs (all labs ordered are listed, but only abnormal results are displayed) Labs Reviewed  SARS CORONAVIRUS 2 (TAT 6-24 HRS)    EKG   Radiology No results found.  Procedures Procedures (including critical care time)  Medications Ordered in UC Medications  albuterol (VENTOLIN HFA) 108 (90 Base) MCG/ACT inhaler 2 puff (2 puffs Inhalation Given 10/15/19 1028)    Initial Impression / Assessment and Plan / UC Course  I have reviewed the triage vital signs and the nursing notes.  Pertinent labs & imaging results that were available during my care of the patient were reviewed by me and considered in my medical decision making (see chart for details).     Again reiterated emphasis on establishing and following with a PCP and/or GI. Return precautions provided.  Patient verbalized understanding and agreeable to plan.   Final Clinical Impressions(s) / UC Diagnoses   Final diagnoses:  Moderate persistent asthma without complication   Abdominal cramping  Ulcerative colitis with complication, unspecified location [facility] [place])     Discharge Instructions     Please restart your daily maintenance inhaler.  Albuterol prn.  Restart your medications for UC.  Please follow up with your PCP and gastroenterology for long term management.  Continue to decrease smoking to  quit, as able.     ED Prescriptions    Medication Sig Dispense Auth. Provider   albuterol (VENTOLIN HFA) 108 (90 Base) MCG/ACT inhaler Inhale 2 puffs into the lungs every 4 (four) hours as needed for wheezing or shortness of breath. 18 g Jerome Gamble B, NP   Budesonide 90 MCG/ACT inhaler Inhale 2 puffs into the lungs 2 (two) times daily. 1 each Zigmund Gottron, NP   dicyclomine (BENTYL) 20 MG tablet Take 1 tablet (20 mg total) by mouth 4 (four) times daily -  before meals and at bedtime. As needed for abdominal cramping 60 tablet Jerome Gamble B, NP   sulfaSALAzine (AZULFIDINE) 500 MG tablet Take 2 tablets (1,000 mg total) by mouth 3 (three) times daily. 180 tablet Vivan Agostino, Lanelle Bal B, NP   predniSONE (STERAPRED UNI-PAK 21 TAB) 10 MG (21) TBPK tablet Take by mouth daily. Per box instruction 21 tablet Zigmund Gottron, NP     PDMP not reviewed this encounter.   Zigmund Gottron, NP 10/15/19 1438

## 2019-10-15 NOTE — ED Triage Notes (Addendum)
Pt c/o possible bronchithis, he states "I come in here for this all the time." Pt states this has been going on for about a month. Pt states he has not been taking any medication and would like to be tested for covid. He states he feels like his ulcerative colitis is also flaring up.

## 2019-10-26 IMAGING — CR DG CHEST 2V
2 series · 2 of 2 positions shown · non-contrast
Comparison: 09/22/2017

CLINICAL DATA: Shortness of breath

EXAM:
CHEST - 2 VIEW

[w chest pa]
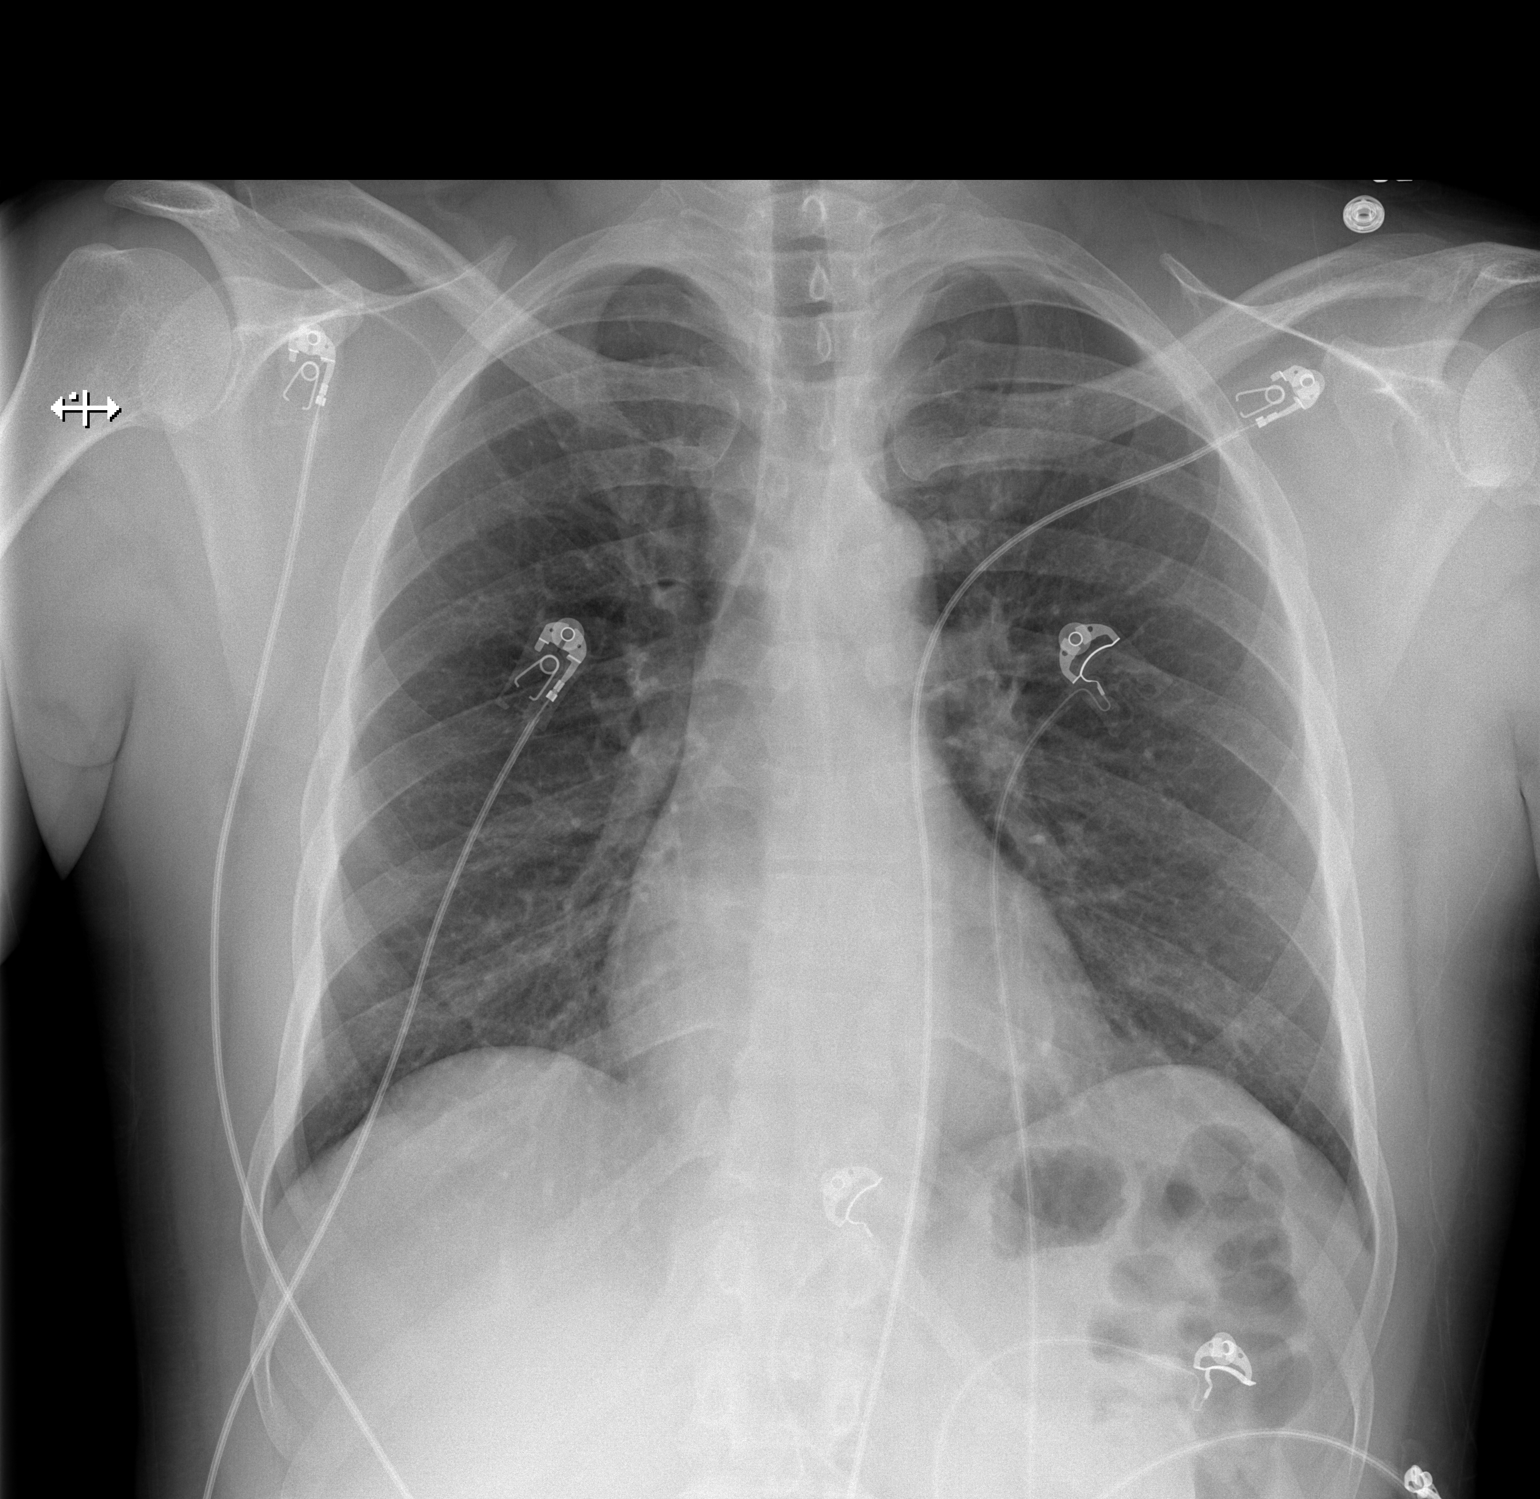

[w chest lat]
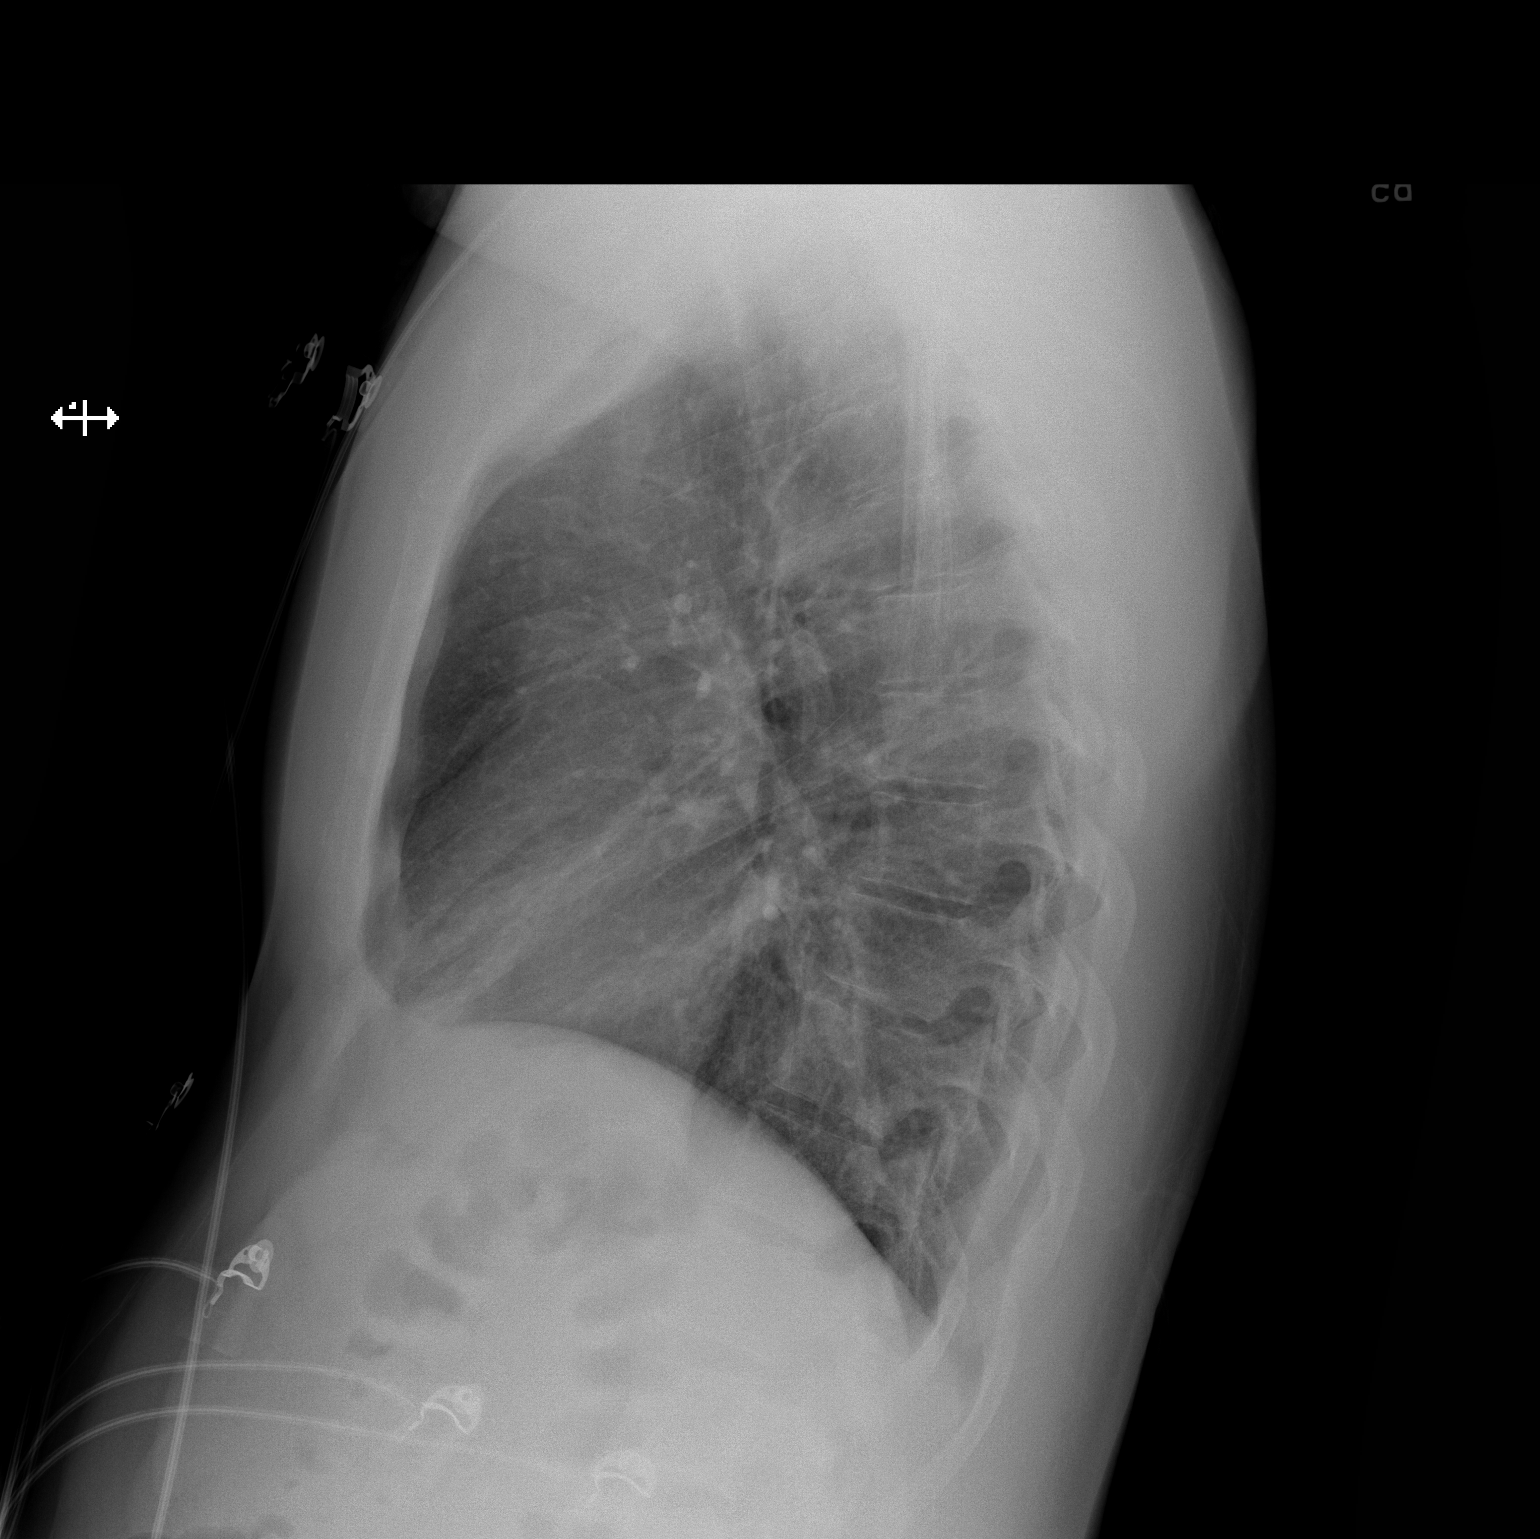

[2 of 2 positions shown; findings below may reference images not displayed]

FINDINGS: Heart and mediastinal contours are within normal limits. No focal
opacities or effusions. No acute bony abnormality.
IMPRESSION: No active cardiopulmonary disease.

## 2019-12-06 ENCOUNTER — Emergency Department (HOSPITAL_COMMUNITY)
Admission: EM | Admit: 2019-12-06 | Discharge: 2019-12-06 | Disposition: A | Discharge status: Home / Self Care | Payer: Self-pay | Attending: Emergency Medicine | Admitting: Emergency Medicine

## 2019-12-06 ENCOUNTER — Encounter (HOSPITAL_COMMUNITY): Payer: Self-pay

## 2019-12-06 ENCOUNTER — Other Ambulatory Visit: Payer: Self-pay

## 2019-12-06 DIAGNOSIS — R0602 Shortness of breath: Secondary | ICD-10-CM | POA: Insufficient documentation

## 2019-12-06 DIAGNOSIS — Z7951 Long term (current) use of inhaled steroids: Secondary | ICD-10-CM | POA: Insufficient documentation

## 2019-12-06 DIAGNOSIS — J45909 Unspecified asthma, uncomplicated: Secondary | ICD-10-CM | POA: Insufficient documentation

## 2019-12-06 DIAGNOSIS — Z87891 Personal history of nicotine dependence: Secondary | ICD-10-CM | POA: Insufficient documentation

## 2019-12-06 DIAGNOSIS — R0981 Nasal congestion: Secondary | ICD-10-CM | POA: Insufficient documentation

## 2019-12-06 MED ORDER — PREDNISONE 10 MG (21) PO TBPK: ORAL_TABLET | Freq: Every day | ORAL | 0 refills | Status: DC

## 2019-12-06 MED ORDER — AEROCHAMBER PLUS FLO-VU MISC
1.0000 | Freq: Once | Status: DC
  Filled 2019-12-06: qty 1

## 2019-12-06 MED ORDER — ALBUTEROL SULFATE HFA 108 (90 BASE) MCG/ACT IN AERS
2.0000 | INHALATION_SPRAY | RESPIRATORY_TRACT | Status: DC | PRN
  Filled 2019-12-06: qty 6.7

## 2019-12-06 NOTE — ED Provider Notes (Signed)
[facility] DEPT Provider Note: Georgena Spurling, MD, FACEP  CSN: [id number] MRN: [id number] ARRIVAL: 12/06/19 at [place]: [place] of Breath   HISTORY OF PRESENT ILLNESS  12/06/19 3:49 AM Jerome Irwin is a 39 y.o. male with a history of asthma and ulcerative colitis.  He is here with vague complaints of shortness of breath and chest congestion.  Symptoms are not severe but he is out of his albuterol inhaler.  He thinks he may be having an exacerbation of his Crohn's disease because he is having some vague, mild, poorly characterized discomfort in his abdomen.  He states he is usually treated with steroids when he has similar symptoms.  He is not vomiting or having diarrhea.  He has not had a fever.   Past Medical History:  Diagnosis Date   Asthma    Bronchitis    Pneumonia    Ulcerative colitis ([place])     Past Surgical History:  Procedure Laterality Date   COLONOSCOPY WITH PROPOFOL N/A 02/21/2016   Procedure: COLONOSCOPY WITH PROPOFOL;  Surgeon: Arta Silence, MD;  Location: [facility];  Service: Endoscopy;  Laterality: N/A;    Family History  Problem Relation Age of Onset   Healthy Mother    Healthy Father     Social History   Tobacco Use   Smoking status: Former Smoker    Packs/day: 0.00   Smokeless tobacco: Never Used  Scientific laboratory technician Use: Never used  Substance Use Topics   Alcohol use: Not Currently    Comment: former   Drug use: Yes    Types: Cocaine, Marijuana    Comment: last used cocaine today    Prior to Admission medications   Medication Sig Start Date End Date Taking? Authorizing Provider  albuterol (VENTOLIN HFA) 108 (90 Base) MCG/ACT inhaler Inhale 2 puffs into the lungs every 4 (four) hours as needed for wheezing or shortness of breath. 10/15/19   Zigmund Gottron, NP  Budesonide 90 MCG/ACT inhaler Inhale 2 puffs into the lungs 2 (two) times daily. 10/15/19   Zigmund Gottron, NP  dicyclomine  (BENTYL) 20 MG tablet Take 1 tablet (20 mg total) by mouth 4 (four) times daily -  before meals and at bedtime. As needed for abdominal cramping 10/15/19   [place] Gamble B, NP  ipratropium-albuterol (DUONEB) 0.5-2.5 (3) MG/3ML SOLN SMARTSIG:1 Vial(s) Via Nebulizer 4 Times Daily PRN 01/26/19   [provider]  levocetirizine (XYZAL) 5 MG tablet Take 1 tablet (5 mg total) by mouth every evening. 05/22/19   Jaynee Eagles, PA-C  omeprazole (PRILOSEC) 20 MG capsule Take 1 capsule (20 mg total) by mouth daily. 11/27/18   Raylene Everts, MD  predniSONE (STERAPRED UNI-PAK 21 TAB) 10 MG (21) TBPK tablet Take by mouth daily. Per box instruction 12/06/19   Dwyane Dupree, Jenny Reichmann, MD  pseudoephedrine (SUDAFED) 60 MG tablet Take 1 tablet (60 mg total) by mouth 2 (two) times daily as needed for congestion. 05/22/19   Jaynee Eagles, PA-C  sulfaSALAzine (AZULFIDINE) 500 MG tablet Take 2 tablets (1,000 mg total) by mouth 3 (three) times daily. 10/15/19 11/14/19  Zigmund Gottron, NP  cetirizine (ZYRTEC) 5 MG tablet Take 2 tablets (10 mg total) by mouth daily. 08/30/18 10/29/18  Langston Masker B, PA-C  ferrous sulfate 325 (65 FE) MG EC tablet Take 325 mg by mouth 3 (three) times daily with meals.  10/29/18  [provider]  fluticasone (FLONASE) 50 MCG/ACT nasal spray Place  1 spray into both nostrils daily as needed for allergies or rhinitis. 08/30/18 01/06/19  Langston Masker B, PA-C  Fluticasone-Salmeterol (ADVAIR) 100-50 MCG/DOSE AEPB Inhale 1 puff into the lungs 2 (two) times daily. 09/06/17 12/25/18  Wieters, Hallie C, PA-C  montelukast (SINGULAIR) 10 MG tablet Take 1 tablet (10 mg total) by mouth at bedtime. 12/13/18 01/06/19  Melynda Ripple, MD    Allergies Patient has no known allergies.   REVIEW OF SYSTEMS  Negative except as noted here or in the History of Present Illness.   PHYSICAL EXAMINATION  Initial Vital Signs Blood pressure 120/80, pulse 87, temperature 98.1 F (36.7 C), temperature source Oral,  resp. rate 16, height 5' 11"  (1.803 m), weight 79.4 kg, SpO2 94 %.  Examination General: Well-developed, well-nourished male in no acute distress; appearance consistent with age of record HENT: normocephalic; atraumatic Eyes: Normal appearance Neck: supple Heart: regular rate and rhythm Lungs: Faint inspiratory and expiratory wheezes Abdomen: soft; nondistended; equivocal diffuse tenderness; bowel sounds present Extremities: No deformity; full range of motion Neurologic: Awake, alert and oriented; motor function intact in all extremities and symmetric; no facial droop Skin: Warm and dry Psychiatric: Slow, evasive, noncommittal answers to questions   RESULTS  Summary of this visit's results, reviewed and interpreted by myself:   EKG Interpretation  Date/Time:    Ventricular Rate:    PR Interval:    QRS Duration:   QT Interval:    QTC Calculation:   R Axis:     Text Interpretation:        Laboratory Studies: No results found for this or any previous visit (from the past 24 hour(s)). Imaging Studies: No results found.  ED COURSE and MDM  Nursing notes, initial and subsequent vitals signs, including pulse oximetry, reviewed and interpreted by myself.  Vitals:   12/06/19 0342  BP: 120/80  Pulse: 87  Resp: 16  Temp: 98.1 F (36.7 C)  TempSrc: Oral  SpO2: 94%  Weight: 79.4 kg  Height: 5' 11"  (1.803 m)   Medications  albuterol (VENTOLIN HFA) 108 (90 Base) MCG/ACT inhaler 2 puff (has no administration in time range)  aerochamber plus with mask device 1 each (has no administration in time range)    Patient agrees to being treated with an albuterol inhaler and steroids for several days.    PROCEDURES  Procedures   ED DIAGNOSES     ICD-10-CM   1. Shortness of breath  R06.02        Shanon Rosser, MD 12/06/19 [phone]

## 2019-12-06 NOTE — ED Triage Notes (Signed)
Pt sts shob and chest congestion and that it may be due to his ulcerative colitis. Pt says he is not sure which is bothering him maybe both.

## 2020-02-17 ENCOUNTER — Ambulatory Visit (HOSPITAL_COMMUNITY)
Admission: EM | Admit: 2020-02-17 | Discharge: 2020-02-17 | Disposition: A | Discharge status: Home / Self Care | Payer: Self-pay | Attending: Student | Admitting: Student

## 2020-02-17 ENCOUNTER — Other Ambulatory Visit: Payer: Self-pay

## 2020-02-17 ENCOUNTER — Encounter (HOSPITAL_COMMUNITY): Payer: Self-pay

## 2020-02-17 DIAGNOSIS — Z8616 Personal history of COVID-19: Secondary | ICD-10-CM

## 2020-02-17 DIAGNOSIS — Z760 Encounter for issue of repeat prescription: Secondary | ICD-10-CM

## 2020-02-17 DIAGNOSIS — K5190 Ulcerative colitis, unspecified, without complications: Secondary | ICD-10-CM

## 2020-02-17 DIAGNOSIS — F17200 Nicotine dependence, unspecified, uncomplicated: Secondary | ICD-10-CM

## 2020-02-17 MED ORDER — PREDNISONE 20 MG PO TABS: 40.0000 mg | ORAL_TABLET | Freq: Every day | ORAL | 0 refills | Status: AC

## 2020-02-17 MED ORDER — ALBUTEROL SULFATE HFA 108 (90 BASE) MCG/ACT IN AERS
INHALATION_SPRAY | RESPIRATORY_TRACT | Status: AC
  Filled 2020-02-17: qty 6.7

## 2020-02-17 MED ORDER — ALBUTEROL SULFATE HFA 108 (90 BASE) MCG/ACT IN AERS
2.0000 | INHALATION_SPRAY | Freq: Once | RESPIRATORY_TRACT | Status: AC
  Administered 2020-02-17: 2 via RESPIRATORY_TRACT

## 2020-02-17 MED ORDER — SULFASALAZINE 500 MG PO TABS: 1000.0000 mg | ORAL_TABLET | Freq: Three times a day (TID) | ORAL | 0 refills | Status: DC

## 2020-02-17 MED ORDER — METHYLPREDNISOLONE SODIUM SUCC 125 MG IJ SOLR
INTRAMUSCULAR | Status: AC
  Filled 2020-02-17: qty 2

## 2020-02-17 MED ORDER — METHYLPREDNISOLONE SODIUM SUCC 125 MG IJ SOLR
60.0000 mg | Freq: Once | INTRAMUSCULAR | Status: AC
  Administered 2020-02-17: 60 mg via INTRAMUSCULAR

## 2020-02-17 NOTE — Discharge Instructions (Signed)
-  Restart your medication for ulcerative colitis- sulfasalazine, two tablets three times daily  -We've given you a shot of prednisone today. Starting tomorrow, take two pills of prednisone daily for 5 days. This will help with your cough and your UC.

## 2020-02-17 NOTE — ED Provider Notes (Signed)
[place]    CSN: [id number] Arrival date & time: 02/17/20  1205      History   Chief Complaint Chief Complaint  Patient presents with  . Medication Refill  . Ulcerative Colitis     HPI Jerome Irwin is a 40 y.o. male presenting for medication refill for ulcerative colitis, as well as cough due to smoking/recent covid. History asthma, bronchitis, pneumonia, ulcerative colitis. -Patient states he's been on sulfasalazine for years for control of UC. However he's been out of this for few weeks and is now having abd pain and bloody diarrhea. PCP couldn't see him today and so he is at this urgent care. He's still having one bowel movement daily. Denies n/v/d.  -Patient also states he had covid19 two weeks ago and is feeling much better, however, he is still coughing and feeling shortness of breath after he smokes. Requesting albuterol inhaler today as this is what helped with the cough in the past. Denies fevers/chills, n/v/d, current shortness of breath, chest pain, current cough, congestion, facial pain, teeth pain, headaches, sore throat, loss of taste/smell, swollen lymph nodes, ear pain.    HPI  Past Medical History:  Diagnosis Date  . Asthma   . Bronchitis   . Pneumonia   . Ulcerative colitis ([place])     There are no problems to display for this patient.   Past Surgical History:  Procedure Laterality Date  . COLONOSCOPY WITH PROPOFOL N/A 02/21/2016   Procedure: COLONOSCOPY WITH PROPOFOL;  Surgeon: Arta Silence, MD;  Location: [facility];  Service: Endoscopy;  Laterality: N/A;       Home Medications    Prior to Admission medications   Medication Sig Start Date End Date Taking? Authorizing Provider  predniSONE (DELTASONE) 20 MG tablet Take 2 tablets (40 mg total) by mouth daily for 5 days. 02/17/20 02/22/20 Yes Hazel Sams, PA-C  albuterol (VENTOLIN HFA) 108 (90 Base) MCG/ACT inhaler Inhale 2 puffs into the lungs every 4 (four) hours as needed for  wheezing or shortness of breath. 10/15/19   Zigmund Gottron, NP  Budesonide 90 MCG/ACT inhaler Inhale 2 puffs into the lungs 2 (two) times daily. 10/15/19   Zigmund Gottron, NP  dicyclomine (BENTYL) 20 MG tablet Take 1 tablet (20 mg total) by mouth 4 (four) times daily -  before meals and at bedtime. As needed for abdominal cramping 10/15/19   Augusto Gamble B, NP  ipratropium-albuterol (DUONEB) 0.5-2.5 (3) MG/3ML SOLN SMARTSIG:1 Vial(s) Via Nebulizer 4 Times Daily PRN 01/26/19   [provider]  levocetirizine (XYZAL) 5 MG tablet Take 1 tablet (5 mg total) by mouth every evening. 05/22/19   Jaynee Eagles, PA-C  omeprazole (PRILOSEC) 20 MG capsule Take 1 capsule (20 mg total) by mouth daily. 11/27/18   Raylene Everts, MD  pseudoephedrine (SUDAFED) 60 MG tablet Take 1 tablet (60 mg total) by mouth 2 (two) times daily as needed for congestion. 05/22/19   Jaynee Eagles, PA-C  sulfaSALAzine (AZULFIDINE) 500 MG tablet Take 2 tablets (1,000 mg total) by mouth 3 (three) times daily. 02/17/20 03/18/20  Hazel Sams, PA-C  cetirizine (ZYRTEC) 5 MG tablet Take 2 tablets (10 mg total) by mouth daily. 08/30/18 10/29/18  Langston Masker B, PA-C  ferrous sulfate 325 (65 FE) MG EC tablet Take 325 mg by mouth 3 (three) times daily with meals.  10/29/18  [provider]  fluticasone (FLONASE) 50 MCG/ACT nasal spray Place 1 spray into both nostrils daily as needed for  allergies or rhinitis. 08/30/18 01/06/19  Langston Masker B, PA-C  Fluticasone-Salmeterol (ADVAIR) 100-50 MCG/DOSE AEPB Inhale 1 puff into the lungs 2 (two) times daily. 09/06/17 12/25/18  Wieters, Hallie C, PA-C  montelukast (SINGULAIR) 10 MG tablet Take 1 tablet (10 mg total) by mouth at bedtime. 12/13/18 01/06/19  Melynda Ripple, MD    Family History Family History  Problem Relation Age of Onset  . Healthy Mother   . Healthy Father     Social History Social History   Tobacco Use  . Smoking status: Former Smoker    Packs/day: 0.00   . Smokeless tobacco: Never Used  Vaping Use  . Vaping Use: Never used  Substance Use Topics  . Alcohol use: Not Currently    Comment: former  . Drug use: Yes    Types: Cocaine, Marijuana    Comment: last used cocaine today     Allergies   Patient has no known allergies.   Review of Systems Review of Systems  Constitutional: Negative for appetite change, chills and fever.  HENT: Negative for congestion, ear pain, rhinorrhea, sinus pressure, sinus pain and sore throat.   Eyes: Negative for redness and visual disturbance.  Respiratory: Negative for cough, chest tightness, shortness of breath and wheezing.   Cardiovascular: Negative for chest pain and palpitations.  Gastrointestinal: Positive for abdominal pain and blood in stool. Negative for constipation, diarrhea, nausea and vomiting.  Genitourinary: Negative for dysuria, frequency and urgency.  Musculoskeletal: Negative for myalgias.  Neurological: Negative for dizziness, weakness and headaches.  Psychiatric/Behavioral: Negative for confusion.  All other systems reviewed and are negative.    Physical Exam Triage Vital Signs ED Triage Vitals  Enc Vitals Group     BP 02/17/20 1339 124/76     Pulse Rate 02/17/20 1339 78     Resp 02/17/20 1339 16     Temp 02/17/20 1339 98.2 F (36.8 C)     Temp Source 02/17/20 1339 Oral     SpO2 02/17/20 1339 99 %     Weight --      Height --      Head Circumference --      Peak Flow --      Pain Score 02/17/20 1353 4     Pain Loc --      Pain Edu? --      Excl. in [place]? --    No data found.  Updated Vital Signs BP 124/76 (BP Location: Left Arm)   Pulse 78   Temp 98.2 F (36.8 C) (Oral)   Resp 16   SpO2 99%   Visual Acuity Right Eye Distance:   Left Eye Distance:   Bilateral Distance:    Right Eye Near:   Left Eye Near:    Bilateral Near:     Physical Exam Vitals reviewed.  Constitutional:      General: He is not in acute distress.    Appearance: Normal  appearance. He is not ill-appearing.  HENT:     Head: Normocephalic and atraumatic.     Right Ear: Hearing, tympanic membrane, ear canal and external ear normal. No swelling or tenderness. There is no impacted cerumen. No mastoid tenderness. Tympanic membrane is not perforated, erythematous, retracted or bulging.     Left Ear: Hearing, tympanic membrane, ear canal and external ear normal. No swelling or tenderness. There is no impacted cerumen. No mastoid tenderness. Tympanic membrane is not perforated, erythematous, retracted or bulging.     Nose:     Right  Sinus: No maxillary sinus tenderness or frontal sinus tenderness.     Left Sinus: No maxillary sinus tenderness or frontal sinus tenderness.     Mouth/Throat:     Mouth: Mucous membranes are moist.     Pharynx: Uvula midline. No oropharyngeal exudate or posterior oropharyngeal erythema.     Tonsils: No tonsillar exudate.  Cardiovascular:     Rate and Rhythm: Normal rate and regular rhythm.     Heart sounds: Normal heart sounds.  Pulmonary:     Breath sounds: Normal air entry. Wheezing present. No rhonchi or rales.     Comments: Scattered wheezes throughout Chest:     Chest wall: No tenderness.  Abdominal:     General: Abdomen is flat. Bowel sounds are normal. There is no distension.     Tenderness: There is generalized abdominal tenderness. There is no right CVA tenderness, left CVA tenderness, guarding or rebound. Negative signs include Murphy's sign, Rovsing's sign and McBurney's sign.  Lymphadenopathy:     Cervical: No cervical adenopathy.  Neurological:     General: No focal deficit present.     Mental Status: He is alert and oriented to person, place, and time.  Psychiatric:        Attention and Perception: Attention and perception normal.        Mood and Affect: Mood and affect normal.        Behavior: Behavior normal. Behavior is cooperative.        Thought Content: Thought content normal.        Judgment: Judgment normal.       UC Treatments / Results  Labs (all labs ordered are listed, but only abnormal results are displayed) Labs Reviewed - No data to display  EKG   Radiology No results found.  Procedures Procedures (including critical care time)  Medications Ordered in UC Medications  methylPREDNISolone sodium succinate (SOLU-MEDROL) 125 mg/2 mL injection 60 mg (has no administration in time range)  albuterol (VENTOLIN HFA) 108 (90 Base) MCG/ACT inhaler 2 puff (has no administration in time range)    Initial Impression / Assessment and Plan / UC Course  I have reviewed the triage vital signs and the nursing notes.  Pertinent labs & imaging results that were available during my care of the patient were reviewed by me and considered in my medical decision making (see chart for details).     For ulcerative colitis:  -Restart your medication for ulcerative colitis- sulfasalazine, two tablets three times daily. Also prednisone as below. He is not a diabetic.  Return precautions- chest pain, shortness of breath, new/worsening fevers/chills, confusion, worsening of symptoms despite the above treatment plan, etc.   For bronchitis/cough/smoking/recent covid:  afebrile nontachycardic nontachypneic, oxygenating well on room air:  -Albuterol as needed. Also prednisone as below.   Pt declines covid test given he had covid >2 weeks ago.  Final Clinical Impressions(s) / UC Diagnoses   Final diagnoses:  Medication refill  Ulcerative colitis without complications, unspecified location [facility])  Smoker  History of 2019 novel coronavirus disease (COVID-19)     Discharge Instructions     -Restart your medication for ulcerative colitis- sulfasalazine, two tablets three times daily  -We've given you a shot of prednisone today. Starting tomorrow, take two pills of prednisone daily for 5 days. This will help with your cough and your UC.    ED Prescriptions    Medication Sig Dispense Auth. Provider    sulfaSALAzine (AZULFIDINE) 500 MG tablet Take 2 tablets (1,000 mg  total) by mouth 3 (three) times daily. 180 tablet Hazel Sams, PA-C   predniSONE (DELTASONE) 20 MG tablet Take 2 tablets (40 mg total) by mouth daily for 5 days. 10 tablet Hazel Sams, PA-C     PDMP not reviewed this encounter.   Hazel Sams, PA-C 02/17/20 [phone]

## 2020-02-17 NOTE — ED Triage Notes (Signed)
Pt presents with a flare up of his ulcerative colitis and is unable to get an appt with PCP

## 2020-05-07 ENCOUNTER — Encounter (HOSPITAL_COMMUNITY): Payer: Self-pay | Admitting: *Deleted

## 2020-05-07 ENCOUNTER — Other Ambulatory Visit: Payer: Self-pay

## 2020-05-07 ENCOUNTER — Ambulatory Visit (HOSPITAL_COMMUNITY)
Admission: EM | Admit: 2020-05-07 | Discharge: 2020-05-07 | Disposition: A | Discharge status: Home / Self Care | Payer: Self-pay | Attending: Family Medicine | Admitting: Family Medicine

## 2020-05-07 DIAGNOSIS — J3089 Other allergic rhinitis: Secondary | ICD-10-CM

## 2020-05-07 DIAGNOSIS — I809 Phlebitis and thrombophlebitis of unspecified site: Secondary | ICD-10-CM

## 2020-05-07 DIAGNOSIS — J4521 Mild intermittent asthma with (acute) exacerbation: Secondary | ICD-10-CM

## 2020-05-07 MED ORDER — ALBUTEROL SULFATE HFA 108 (90 BASE) MCG/ACT IN AERS
2.0000 | INHALATION_SPRAY | Freq: Once | RESPIRATORY_TRACT | Status: AC
  Administered 2020-05-07: 2 via RESPIRATORY_TRACT

## 2020-05-07 MED ORDER — ALBUTEROL SULFATE HFA 108 (90 BASE) MCG/ACT IN AERS
INHALATION_SPRAY | RESPIRATORY_TRACT | Status: AC
  Filled 2020-05-07: qty 6.7

## 2020-05-07 MED ORDER — DEXAMETHASONE SODIUM PHOSPHATE 10 MG/ML IJ SOLN
10.0000 mg | Freq: Once | INTRAMUSCULAR | Status: AC
  Administered 2020-05-07: 10 mg via INTRAMUSCULAR

## 2020-05-07 MED ORDER — DEXAMETHASONE SODIUM PHOSPHATE 10 MG/ML IJ SOLN
INTRAMUSCULAR | Status: AC
  Filled 2020-05-07: qty 1

## 2020-05-07 MED ORDER — LEVOCETIRIZINE DIHYDROCHLORIDE 5 MG PO TABS: 5.0000 mg | ORAL_TABLET | Freq: Every evening | ORAL | 0 refills | Status: DC

## 2020-05-07 NOTE — ED Provider Notes (Addendum)
[place]    CSN: [id number] Arrival date & time: 05/07/20  1006      History   Chief Complaint Chief Complaint  Patient presents with  . Medication Refill  . Ecchymosis    HPI Jerome Irwin is a 40 y.o. male.   Patient here today requesting a refill for his albuterol inhaler as he ran out and his asthma has been acting up.  He states he has been wheezing and has had chest tightness the past few days with the seasonal changes and the pollen.  He is not currently on allergy medication and is also having sinus drainage and inflammation.  He denies significant shortness of breath, chest pain, fever, chills, dizziness, syncope.  States he does not have the money for purchasing an inhaler from the pharmacy and is requesting 1 to be given to him in person today. Also has some left AC bruising from donating plasma that he has been worried about.  States he donated a few days ago and the area has been swollen and discolored and painful.  Does seem to be improving today and is much less painful.  Not trying anything over-the-counter for this.     Past Medical History:  Diagnosis Date  . Asthma   . Bronchitis   . Pneumonia   . Ulcerative colitis ([place])     There are no problems to display for this patient.   Past Surgical History:  Procedure Laterality Date  . COLONOSCOPY WITH PROPOFOL N/A 02/21/2016   Procedure: COLONOSCOPY WITH PROPOFOL;  Surgeon: Arta Silence, MD;  Location: [facility];  Service: Endoscopy;  Laterality: N/A;       Home Medications    Prior to Admission medications   Medication Sig Start Date End Date Taking? Authorizing Provider  sulfaSALAzine (AZULFIDINE) 500 MG tablet Take 2 tablets (1,000 mg total) by mouth 3 (three) times daily. 02/17/20 03/18/20 Yes Hazel Sams, PA-C  albuterol (VENTOLIN HFA) 108 (90 Base) MCG/ACT inhaler Inhale 2 puffs into the lungs every 4 (four) hours as needed for wheezing or shortness of breath. 10/15/19    Zigmund Gottron, NP  Budesonide 90 MCG/ACT inhaler Inhale 2 puffs into the lungs 2 (two) times daily. 10/15/19   Zigmund Gottron, NP  dicyclomine (BENTYL) 20 MG tablet Take 1 tablet (20 mg total) by mouth 4 (four) times daily -  before meals and at bedtime. As needed for abdominal cramping 10/15/19   Augusto Gamble B, NP  ipratropium-albuterol (DUONEB) 0.5-2.5 (3) MG/3ML SOLN SMARTSIG:1 Vial(s) Via Nebulizer 4 Times Daily PRN 01/26/19   [provider]  levocetirizine (XYZAL) 5 MG tablet Take 1 tablet (5 mg total) by mouth every evening. 05/07/20   Volney American, PA-C  omeprazole (PRILOSEC) 20 MG capsule Take 1 capsule (20 mg total) by mouth daily. 11/27/18   Raylene Everts, MD  pseudoephedrine (SUDAFED) 60 MG tablet Take 1 tablet (60 mg total) by mouth 2 (two) times daily as needed for congestion. 05/22/19   Jaynee Eagles, PA-C  cetirizine (ZYRTEC) 5 MG tablet Take 2 tablets (10 mg total) by mouth daily. 08/30/18 10/29/18  Langston Masker B, PA-C  ferrous sulfate 325 (65 FE) MG EC tablet Take 325 mg by mouth 3 (three) times daily with meals.  10/29/18  [provider]  fluticasone (FLONASE) 50 MCG/ACT nasal spray Place 1 spray into both nostrils daily as needed for allergies or rhinitis. 08/30/18 01/06/19  Langston Masker B, PA-C  Fluticasone-Salmeterol (ADVAIR) 100-50 MCG/DOSE AEPB  Inhale 1 puff into the lungs 2 (two) times daily. 09/06/17 12/25/18  Wieters, Hallie C, PA-C  montelukast (SINGULAIR) 10 MG tablet Take 1 tablet (10 mg total) by mouth at bedtime. 12/13/18 01/06/19  Melynda Ripple, MD    Family History Family History  Problem Relation Age of Onset  . Healthy Mother   . Healthy Father     Social History Social History   Tobacco Use  . Smoking status: Former Smoker    Packs/day: 0.00  . Smokeless tobacco: Never Used  Vaping Use  . Vaping Use: Never used  Substance Use Topics  . Alcohol use: Not Currently  . Drug use: Yes    Types: Cocaine, Marijuana     Comment: occasional cocaine use - last use few days ago; daily marijuana use     Allergies   Patient has no known allergies.   Review of Systems Review of Systems Per HPI  Physical Exam Triage Vital Signs ED Triage Vitals  Enc Vitals Group     BP 05/07/20 1027 122/78     Pulse Rate 05/07/20 1027 (!) 58     Resp 05/07/20 1027 18     Temp 05/07/20 1027 98.8 F (37.1 C)     Temp Source 05/07/20 1027 Oral     SpO2 05/07/20 1027 99 %     Weight --      Height --      Head Circumference --      Peak Flow --      Pain Score 05/07/20 1028 0     Pain Loc --      Pain Edu? --      Excl. in [place]? --    No data found.  Updated Vital Signs BP 122/78   Pulse (!) 58   Temp 98.8 F (37.1 C) (Oral)   Resp 18   SpO2 99%   Visual Acuity Right Eye Distance:   Left Eye Distance:   Bilateral Distance:    Right Eye Near:   Left Eye Near:    Bilateral Near:     Physical Exam Vitals and nursing note reviewed.  Constitutional:      Appearance: Normal appearance.  HENT:     Head: Atraumatic.     Right Ear: Tympanic membrane normal.     Left Ear: Tympanic membrane normal.     Nose: Rhinorrhea present.     Mouth/Throat:     Mouth: Mucous membranes are moist.     Pharynx: Posterior oropharyngeal erythema present. No oropharyngeal exudate.  Eyes:     Extraocular Movements: Extraocular movements intact.     Conjunctiva/sclera: Conjunctivae normal.  Cardiovascular:     Rate and Rhythm: Normal rate and regular rhythm.     Heart sounds: Normal heart sounds.  Pulmonary:     Effort: Pulmonary effort is normal.     Breath sounds: Wheezing present. No rales.     Comments: Moderate diffuse wheezes Musculoskeletal:        General: Normal range of motion.     Cervical back: Normal range of motion and neck supple.  Skin:    General: Skin is warm and dry.     Findings: Bruising present.     Comments: Localized area of bruising left AC at site of recent plasma donation.  No  significant edema or tenderness palpation in the area  Neurological:     General: No focal deficit present.     Mental Status: He is oriented to person, place,  and time.  Psychiatric:        Mood and Affect: Mood normal.        Thought Content: Thought content normal.        Judgment: Judgment normal.    UC Treatments / Results  Labs (all labs ordered are listed, but only abnormal results are displayed) Labs Reviewed - No data to display  EKG   Radiology No results found.  Procedures Procedures (including critical care time)  Medications Ordered in UC Medications  albuterol (VENTOLIN HFA) 108 (90 Base) MCG/ACT inhaler 2 puff (2 puffs Inhalation Given 05/07/20 1052)  dexamethasone (DECADRON) injection 10 mg (10 mg Intramuscular Given 05/07/20 1053)    Initial Impression / Assessment and Plan / UC Course  I have reviewed the triage vital signs and the nursing notes.  Pertinent labs & imaging results that were available during my care of the patient were reviewed by me and considered in my medical decision making (see chart for details).     IM Decadron and albuterol administered in clinic for asthma exacerbation.  We will also send in Xyzal refill for his poorly controlled allergic rhinitis.  Discussed importance of following up with primary care and keeping his medications on hand to help control his symptoms.  Return for acutely worsening symptoms or if not resolving. Discussed warm compresses, over-the-counter pain relievers for his superficial phlebitis from donating plasma.  Reassurance given for this.  Final Clinical Impressions(s) / UC Diagnoses   Final diagnoses:  Mild intermittent asthma with acute exacerbation  Seasonal allergic rhinitis due to other allergic trigger  Superficial phlebitis   Discharge Instructions   None    ED Prescriptions    Medication Sig Dispense Auth. Provider   levocetirizine (XYZAL) 5 MG tablet Take 1 tablet (5 mg total) by mouth  every evening. 90 tablet Volney American, [place]     PDMP not reviewed this encounter.   Volney American, [place] 05/07/20 [place], [place], [place] 05/07/20 1055

## 2020-05-07 NOTE — ED Triage Notes (Signed)
Pt requesting refill of albuterol HFA; denies any asthma exacerbation at this time.  Also requesting evaluation of area of left forearm ecchymosis following donation of plasma - venipuncture site was left AC.  No S/S infection noted.

## 2020-05-23 ENCOUNTER — Ambulatory Visit (HOSPITAL_COMMUNITY)
Admission: EM | Admit: 2020-05-23 | Discharge: 2020-05-23 | Disposition: A | Discharge status: Home / Self Care | Payer: Self-pay | Attending: Emergency Medicine | Admitting: Emergency Medicine

## 2020-05-23 ENCOUNTER — Encounter (HOSPITAL_COMMUNITY): Payer: Self-pay | Admitting: Emergency Medicine

## 2020-05-23 ENCOUNTER — Other Ambulatory Visit: Payer: Self-pay

## 2020-05-23 DIAGNOSIS — K5190 Ulcerative colitis, unspecified, without complications: Secondary | ICD-10-CM

## 2020-05-23 DIAGNOSIS — R1084 Generalized abdominal pain: Secondary | ICD-10-CM

## 2020-05-23 MED ORDER — PREDNISONE 20 MG PO TABS: 40.0000 mg | ORAL_TABLET | Freq: Every day | ORAL | 0 refills | Status: AC

## 2020-05-23 NOTE — ED Provider Notes (Signed)
[place]    CSN: [id number] Arrival date & time: 05/23/20  0944      History   Chief Complaint Chief Complaint  Patient presents with  . Abdominal Pain    HPI Jerome Irwin is a 40 y.o. male.   Patient here for evaluation of possible ulcerative colitis flare.  Reports history of ulcerative colitis and recognizes symptoms of flares when it starts.  Patient with multiple visits for same in the past and has not been evaluated by GI or a primary care in several years.  Reports having some abdominal cramping and noticed mucus in stool but denies any significant bloody diarrhea.  Reports taking sulfasalazine as prescribed. Denies any trauma, injury, or other precipitating event.  Denies any specific alleviating or aggravating factors.  Denies any fevers, chest pain, shortness of breath, N/V, numbness, tingling, weakness, or headaches.   ROS: As per HPI, all other pertinent ROS negative   The history is provided by the patient.  Abdominal Pain   Past Medical History:  Diagnosis Date  . Asthma   . Bronchitis   . Pneumonia   . Ulcerative colitis ([place])     There are no problems to display for this patient.   Past Surgical History:  Procedure Laterality Date  . COLONOSCOPY WITH PROPOFOL N/A 02/21/2016   Procedure: COLONOSCOPY WITH PROPOFOL;  Surgeon: Arta Silence, MD;  Location: [facility];  Service: Endoscopy;  Laterality: N/A;       Home Medications    Prior to Admission medications   Medication Sig Start Date End Date Taking? Authorizing Provider  predniSONE (DELTASONE) 20 MG tablet Take 2 tablets (40 mg total) by mouth daily for 5 doses. Day 1 to 5, take 2 tablets twice daily.  Day 6 to 10, take 1 tablet daily. Take medications with breakfast. 05/23/20 05/28/20 Yes Jerome Forster, NP  albuterol (VENTOLIN HFA) 108 (90 Base) MCG/ACT inhaler Inhale 2 puffs into the lungs every 4 (four) hours as needed for wheezing or shortness of breath. 10/15/19   Zigmund Gottron, NP  Budesonide 90 MCG/ACT inhaler Inhale 2 puffs into the lungs 2 (two) times daily. 10/15/19   Zigmund Gottron, NP  dicyclomine (BENTYL) 20 MG tablet Take 1 tablet (20 mg total) by mouth 4 (four) times daily -  before meals and at bedtime. As needed for abdominal cramping 10/15/19   Augusto Gamble B, NP  ipratropium-albuterol (DUONEB) 0.5-2.5 (3) MG/3ML SOLN SMARTSIG:1 Vial(s) Via Nebulizer 4 Times Daily PRN 01/26/19   [provider]  levocetirizine (XYZAL) 5 MG tablet Take 1 tablet (5 mg total) by mouth every evening. 05/07/20   Volney American, PA-C  omeprazole (PRILOSEC) 20 MG capsule Take 1 capsule (20 mg total) by mouth daily. 11/27/18   Raylene Everts, MD  pseudoephedrine (SUDAFED) 60 MG tablet Take 1 tablet (60 mg total) by mouth 2 (two) times daily as needed for congestion. 05/22/19   Jaynee Eagles, PA-C  sulfaSALAzine (AZULFIDINE) 500 MG tablet Take 2 tablets (1,000 mg total) by mouth 3 (three) times daily. 02/17/20 03/18/20  Hazel Sams, PA-C  cetirizine (ZYRTEC) 5 MG tablet Take 2 tablets (10 mg total) by mouth daily. 08/30/18 10/29/18  Langston Masker B, PA-C  ferrous sulfate 325 (65 FE) MG EC tablet Take 325 mg by mouth 3 (three) times daily with meals.  10/29/18  [provider]  fluticasone (FLONASE) 50 MCG/ACT nasal spray Place 1 spray into both nostrils daily as needed for allergies or  rhinitis. 08/30/18 01/06/19  Langston Masker B, PA-C  Fluticasone-Salmeterol (ADVAIR) 100-50 MCG/DOSE AEPB Inhale 1 puff into the lungs 2 (two) times daily. 09/06/17 12/25/18  Wieters, Hallie C, PA-C  montelukast (SINGULAIR) 10 MG tablet Take 1 tablet (10 mg total) by mouth at bedtime. 12/13/18 01/06/19  Melynda Ripple, MD    Family History Family History  Problem Relation Age of Onset  . Healthy Mother   . Healthy Father     Social History Social History   Tobacco Use  . Smoking status: Former Smoker    Packs/day: 0.00  . Smokeless tobacco: Never Used   Vaping Use  . Vaping Use: Never used  Substance Use Topics  . Alcohol use: Not Currently  . Drug use: Yes    Types: Cocaine, Marijuana    Comment: occasional cocaine use - last use few days ago; daily marijuana use     Allergies   Patient has no known allergies.   Review of Systems Review of Systems  Gastrointestinal: Positive for abdominal pain and blood in stool.  All other systems reviewed and are negative.    Physical Exam Triage Vital Signs ED Triage Vitals  Enc Vitals Group     BP 05/23/20 1043 116/74     Pulse Rate 05/23/20 1043 84     Resp 05/23/20 1043 18     Temp 05/23/20 1042 98.8 F (37.1 C)     Temp Source 05/23/20 1042 Oral     SpO2 05/23/20 1043 99 %     Weight --      Height --      Head Circumference --      Peak Flow --      Pain Score 05/23/20 1040 8     Pain Loc --      Pain Edu? --      Excl. in [place]? --    No data found.  Updated Vital Signs BP 116/74 (BP Location: Right Arm)   Pulse 84   Temp 98.8 F (37.1 C) (Oral)   Resp 18   SpO2 99%   Visual Acuity Right Eye Distance:   Left Eye Distance:   Bilateral Distance:    Right Eye Near:   Left Eye Near:    Bilateral Near:     Physical Exam Vitals and nursing note reviewed.  Constitutional:      General: He is not in acute distress.    Appearance: Normal appearance. He is not ill-appearing, toxic-appearing or diaphoretic.  HENT:     Head: Normocephalic and atraumatic.  Eyes:     Conjunctiva/sclera: Conjunctivae normal.     Pupils: Pupils are equal, round, and reactive to light.  Cardiovascular:     Rate and Rhythm: Normal rate.     Pulses: Normal pulses.  Pulmonary:     Effort: Pulmonary effort is normal.  Abdominal:     General: Abdomen is flat. Bowel sounds are normal.     Palpations: Abdomen is soft.     Tenderness: There is no abdominal tenderness. There is no right CVA tenderness, left CVA tenderness, guarding or rebound. Negative signs include Murphy's sign,  Rovsing's sign, McBurney's sign, psoas sign and obturator sign.     Hernia: No hernia is present.  Musculoskeletal:        General: Normal range of motion.     Cervical back: Normal range of motion.  Skin:    General: Skin is warm and dry.  Neurological:     General: No focal  deficit present.     Mental Status: He is alert and oriented to person, place, and time.  Psychiatric:        Mood and Affect: Mood normal.      UC Treatments / Results  Labs (all labs ordered are listed, but only abnormal results are displayed) Labs Reviewed - No data to display  EKG   Radiology No results found.  Procedures Procedures (including critical care time)  Medications Ordered in UC Medications - No data to display  Initial Impression / Assessment and Plan / UC Course  I have reviewed the triage vital signs and the nursing notes.  Pertinent labs & imaging results that were available during my care of the patient were reviewed by me and considered in my medical decision making (see chart for details).     Ulcerative colitis.  Assessment negative for red flags or concerns.  Continue taking prescribed medications.  Prednisone prescribed for symptomatic flare.  Discussed following up with emergency room for any worsening symptoms.  Encourage fluids and rest.  Referral sent to GI for long-term management of ulcerative colitis.  Encouraged patient to get established with Primary Care.   Final Clinical Impressions(s) / UC Diagnoses   Final diagnoses:  Ulcerative colitis without complications, unspecified location [facility])  Generalized abdominal pain     Discharge Instructions     Take the prednisone, 2 tablets twice a day for 5 days, then 1 tablet once a day for 5 days.    Get established with a Primary Care Provider and follow up.   Follow up with Gastroenterology for long term management of your ulcerative colitis.  I have sent in a referral.    Return or go to the Emergency Department  if symptoms worsen or do not improve in the next few days.       ED Prescriptions    Medication Sig Dispense Auth. Provider   predniSONE (DELTASONE) 20 MG tablet Take 2 tablets (40 mg total) by mouth daily for 5 doses. Day 1 to 5, take 2 tablets twice daily.  Day 6 to 10, take 1 tablet daily. Take medications with breakfast. 25 tablet Jerome Forster, NP     PDMP not reviewed this encounter.   Jerome Forster, NP 05/23/20 [phone]

## 2020-05-23 NOTE — ED Triage Notes (Signed)
Patient presents to [facility] for evaluation with hx of ulcerative colitis.  Patient states he has been having constipation, abdominal cramping, blood in stool s x 1.5 weeks.  States when the weather changes he gets a flair up and needs steroids.

## 2020-05-23 NOTE — Discharge Instructions (Signed)
Take the prednisone, 2 tablets twice a day for 5 days, then 1 tablet once a day for 5 days.    Get established with a Primary Care Provider and follow up.   Follow up with Gastroenterology for long term management of your ulcerative colitis.  I have sent in a referral.    Return or go to the Emergency Department if symptoms worsen or do not improve in the next few days.

## 2020-05-29 ENCOUNTER — Encounter: Payer: Self-pay | Admitting: *Deleted

## 2020-07-16 ENCOUNTER — Emergency Department (HOSPITAL_COMMUNITY): Admission: EM | Admit: 2020-07-16 | Discharge: 2020-07-17 | Discharge status: Against Medical Advice | Payer: Self-pay

## 2020-07-16 ENCOUNTER — Other Ambulatory Visit: Payer: Self-pay

## 2020-08-24 ENCOUNTER — Encounter (HOSPITAL_COMMUNITY): Payer: Self-pay

## 2020-08-24 ENCOUNTER — Other Ambulatory Visit (HOSPITAL_BASED_OUTPATIENT_CLINIC_OR_DEPARTMENT_OTHER): Payer: Self-pay

## 2020-08-24 ENCOUNTER — Other Ambulatory Visit: Payer: Self-pay

## 2020-08-24 ENCOUNTER — Emergency Department (HOSPITAL_COMMUNITY)
Admission: EM | Admit: 2020-08-24 | Discharge: 2020-08-24 | Disposition: A | Discharge status: Home / Self Care | Payer: Self-pay | Attending: Emergency Medicine | Admitting: Emergency Medicine

## 2020-08-24 DIAGNOSIS — Z760 Encounter for issue of repeat prescription: Secondary | ICD-10-CM | POA: Insufficient documentation

## 2020-08-24 DIAGNOSIS — J45909 Unspecified asthma, uncomplicated: Secondary | ICD-10-CM | POA: Insufficient documentation

## 2020-08-24 DIAGNOSIS — Z7951 Long term (current) use of inhaled steroids: Secondary | ICD-10-CM | POA: Insufficient documentation

## 2020-08-24 DIAGNOSIS — Z87891 Personal history of nicotine dependence: Secondary | ICD-10-CM | POA: Insufficient documentation

## 2020-08-24 MED ORDER — PREDNISONE 20 MG PO TABS
60.0000 mg | ORAL_TABLET | Freq: Once | ORAL | Status: AC
  Administered 2020-08-24: 60 mg via ORAL
  Filled 2020-08-24: qty 3

## 2020-08-24 MED ORDER — PREDNISONE 20 MG PO TABS
ORAL_TABLET | ORAL | 0 refills | Status: DC
  Filled 2020-08-24: qty 11, 5d supply, fill #0

## 2020-08-24 MED ORDER — ALBUTEROL SULFATE HFA 108 (90 BASE) MCG/ACT IN AERS
2.0000 | INHALATION_SPRAY | RESPIRATORY_TRACT | Status: DC | PRN
  Administered 2020-08-24: 2 via RESPIRATORY_TRACT
  Filled 2020-08-24: qty 6.7

## 2020-08-24 NOTE — ED Provider Notes (Signed)
[place] DEPT Provider Note   CSN: [id number] Arrival date & time: 08/24/20  0720     History Chief Complaint  Patient presents with   Medication Refill    Jerome Irwin is a 40 y.o. male.  The history is provided by the patient and medical records. No language interpreter was used.  Medication Refill  40 year old male significant history of asthma, ulcerative colitis who presents requesting for asthma inhaler.  Patient states he ran out of his medication for "quite a while".  He knows that he needs a PCP to get his medication refilled.  He is here requesting for a refill of his albuterol inhaler.  States that he uses infrequently but lately he has noticed some chest congestion and attributes to weather changes.  He denies any significant productive cough or fever.  No other complaint.  States he does not have the money to get his medication at this time.  Denies any worsening shortness of breath or chest pain.  Past Medical History:  Diagnosis Date   Asthma    Bronchitis    Pneumonia    Ulcerative colitis ([place])     There are no problems to display for this patient.   Past Surgical History:  Procedure Laterality Date   COLONOSCOPY WITH PROPOFOL N/A 02/21/2016   Procedure: COLONOSCOPY WITH PROPOFOL;  Surgeon: Arta Silence, MD;  Location: [facility];  Service: Endoscopy;  Laterality: N/A;       Family History  Problem Relation Age of Onset   Healthy Mother    Healthy Father     Social History   Tobacco Use   Smoking status: Former    Packs/day: 0.00    Types: Cigarettes   Smokeless tobacco: Never  Vaping Use   Vaping Use: Never used  Substance Use Topics   Alcohol use: Not Currently   Drug use: Yes    Types: Cocaine, Marijuana    Comment: occasional cocaine use - last use few days ago; daily marijuana use    Home Medications Prior to Admission medications   Medication Sig Start Date End Date Taking? Authorizing  Provider  albuterol (VENTOLIN HFA) 108 (90 Base) MCG/ACT inhaler Inhale 2 puffs into the lungs every 4 (four) hours as needed for wheezing or shortness of breath. 10/15/19   Zigmund Gottron, NP  Budesonide 90 MCG/ACT inhaler Inhale 2 puffs into the lungs 2 (two) times daily. 10/15/19   Zigmund Gottron, NP  dicyclomine (BENTYL) 20 MG tablet Take 1 tablet (20 mg total) by mouth 4 (four) times daily -  before meals and at bedtime. As needed for abdominal cramping 10/15/19   Augusto Gamble B, NP  ipratropium-albuterol (DUONEB) 0.5-2.5 (3) MG/3ML SOLN SMARTSIG:1 Vial(s) Via Nebulizer 4 Times Daily PRN 01/26/19   [provider]  levocetirizine (XYZAL) 5 MG tablet Take 1 tablet (5 mg total) by mouth every evening. 05/07/20   Volney American, PA-C  omeprazole (PRILOSEC) 20 MG capsule Take 1 capsule (20 mg total) by mouth daily. 11/27/18   Raylene Everts, MD  pseudoephedrine (SUDAFED) 60 MG tablet Take 1 tablet (60 mg total) by mouth 2 (two) times daily as needed for congestion. 05/22/19   Jaynee Eagles, PA-C  sulfaSALAzine (AZULFIDINE) 500 MG tablet Take 2 tablets (1,000 mg total) by mouth 3 (three) times daily. 02/17/20 03/18/20  Hazel Sams, PA-C  cetirizine (ZYRTEC) 5 MG tablet Take 2 tablets (10 mg total) by mouth daily. 08/30/18 10/29/18  Albesa Seen, PA-C  ferrous sulfate 325 (65 FE) MG EC tablet Take 325 mg by mouth 3 (three) times daily with meals.  10/29/18  [provider]  fluticasone (FLONASE) 50 MCG/ACT nasal spray Place 1 spray into both nostrils daily as needed for allergies or rhinitis. 08/30/18 01/06/19  Langston Masker B, PA-C  Fluticasone-Salmeterol (ADVAIR) 100-50 MCG/DOSE AEPB Inhale 1 puff into the lungs 2 (two) times daily. 09/06/17 12/25/18  Wieters, Hallie C, PA-C  montelukast (SINGULAIR) 10 MG tablet Take 1 tablet (10 mg total) by mouth at bedtime. 12/13/18 01/06/19  Melynda Ripple, MD    Allergies    Patient has no known allergies.  Review of Systems    Review of Systems  All other systems reviewed and are negative.  Physical Exam Updated Vital Signs BP 127/90 (BP Location: Left Arm)   Pulse 74   Temp 98.2 F (36.8 C) (Oral)   Resp 18   Ht 5' 11"  (1.803 m)   Wt 79 kg   SpO2 97%   BMI 24.29 kg/m   Physical Exam Vitals and nursing note reviewed.  Constitutional:      General: He is not in acute distress.    Appearance: He is well-developed.  HENT:     Head: Atraumatic.  Eyes:     Conjunctiva/sclera: Conjunctivae normal.  Cardiovascular:     Rate and Rhythm: Normal rate and regular rhythm.     Pulses: Normal pulses.     Heart sounds: Normal heart sounds.  Pulmonary:     Effort: Pulmonary effort is normal.     Breath sounds: Wheezing (Mild faint expiratory wheezes.) present.  Abdominal:     Palpations: Abdomen is soft.  Musculoskeletal:     Cervical back: Neck supple.  Skin:    Findings: No rash.  Neurological:     Mental Status: He is alert. Mental status is at baseline.  Psychiatric:        Mood and Affect: Mood normal.    ED Results / Procedures / Treatments   Labs (all labs ordered are listed, but only abnormal results are displayed) Labs Reviewed - No data to display  EKG None  Radiology No results found.  Procedures Procedures   Medications Ordered in ED Medications  albuterol (VENTOLIN HFA) 108 (90 Base) MCG/ACT inhaler 2 puff (has no administration in time range)  predniSONE (DELTASONE) tablet 60 mg (has no administration in time range)    ED Course  I have reviewed the triage vital signs and the nursing notes.  Pertinent labs & imaging results that were available during my care of the patient were reviewed by me and considered in my medical decision making (see chart for details).    MDM Rules/Calculators/A&P                           BP 127/90 (BP Location: Left Arm)   Pulse 74   Temp 98.2 F (36.8 C) (Oral)   Resp 18   Ht 5' 11"  (1.803 m)   Wt 79 kg   SpO2 97%   BMI 24.29  kg/m   Final Clinical Impression(s) / ED Diagnoses Final diagnoses:  Medication refill    Rx / DC Orders ED Discharge Orders          Ordered    predniSONE (DELTASONE) 20 MG tablet        08/24/20 0821           8:18 AM Patient requesting for an albuterol  inhaler.  He also endorsed having some mild chest congestion.  On exam he does have some faint expiratory wheezes only but appears to be in no acute respiratory discomfort.  Will provide albuterol inhaler.  Patient also requesting for a course of steroid which I think is reasonable.  We will also provide outpatient resources and a PCP.   Domenic Moras, PA-C 08/24/20 2924    Hayden Rasmussen, MD 08/24/20 [phone]

## 2020-08-24 NOTE — ED Triage Notes (Signed)
Patient states he doesn't have the money to get his albuterol inhaler and needs one. Denies shob at this time.

## 2020-08-25 ENCOUNTER — Other Ambulatory Visit: Payer: Self-pay

## 2020-10-07 IMAGING — CR DG CHEST 2V
2 series · 2 of 2 positions shown · non-contrast
Comparison: August 30, 2018

CLINICAL DATA: Chest pain and tightness.

EXAM:
CHEST - 2 VIEW

[w chest pa]
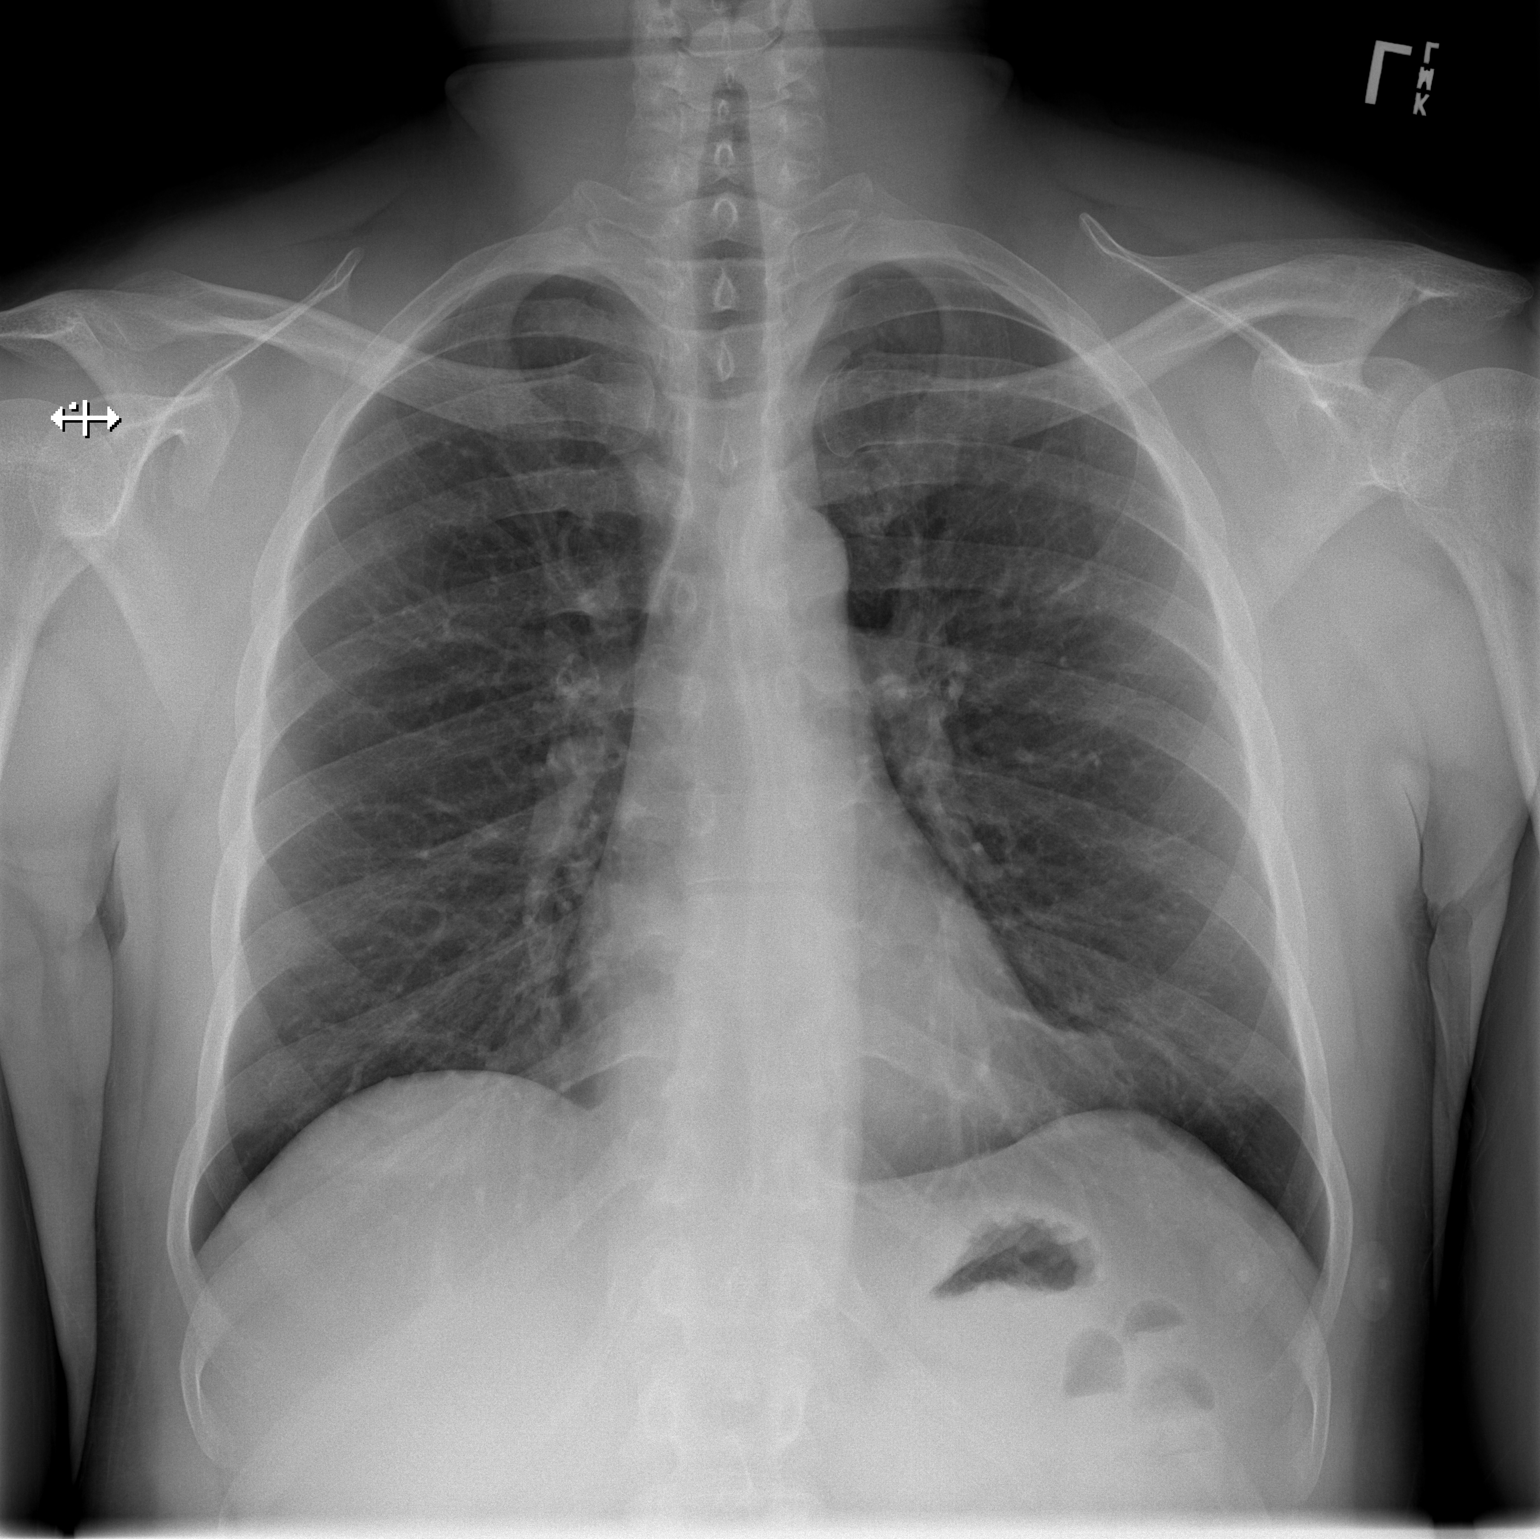

[w chest lat]
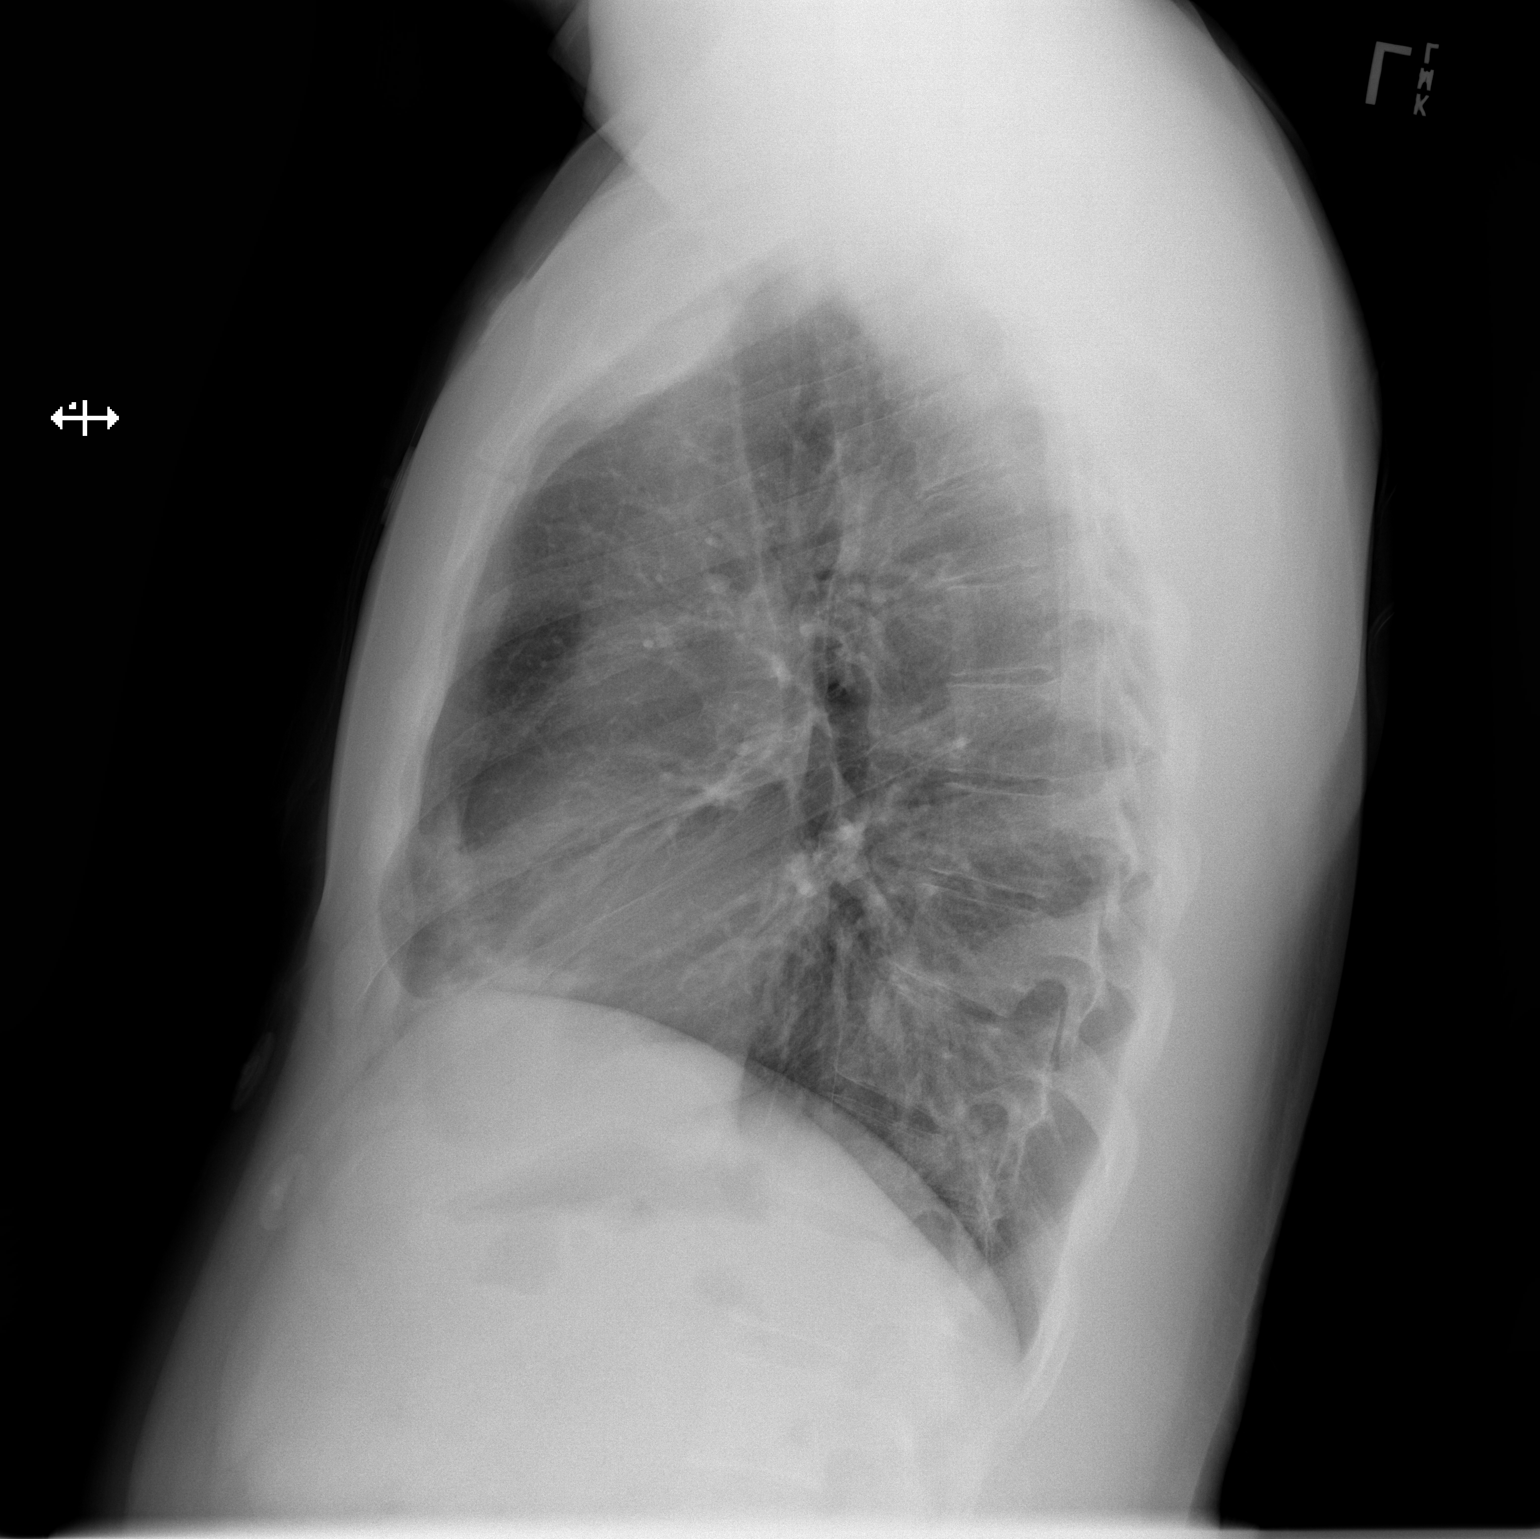

[2 of 2 positions shown; findings below may reference images not displayed]

FINDINGS: Cardiomediastinal silhouette is normal. Mediastinal contours appear
intact.

Subtle subpleural airspace consolidation in the lingula.

Osseous structures are without acute abnormality. Soft tissues are
grossly normal.
IMPRESSION: Subtle subpleural airspace opacity in the lingula, which may
represent atelectasis or airspace consolidation.

## 2020-10-23 IMAGING — DX DG CHEST 2V
2 series · 2 of 2 positions shown · non-contrast
Comparison: 10/20/2018

CLINICAL DATA: Chest pain

EXAM:
CHEST - 2 VIEW

[chest pa]
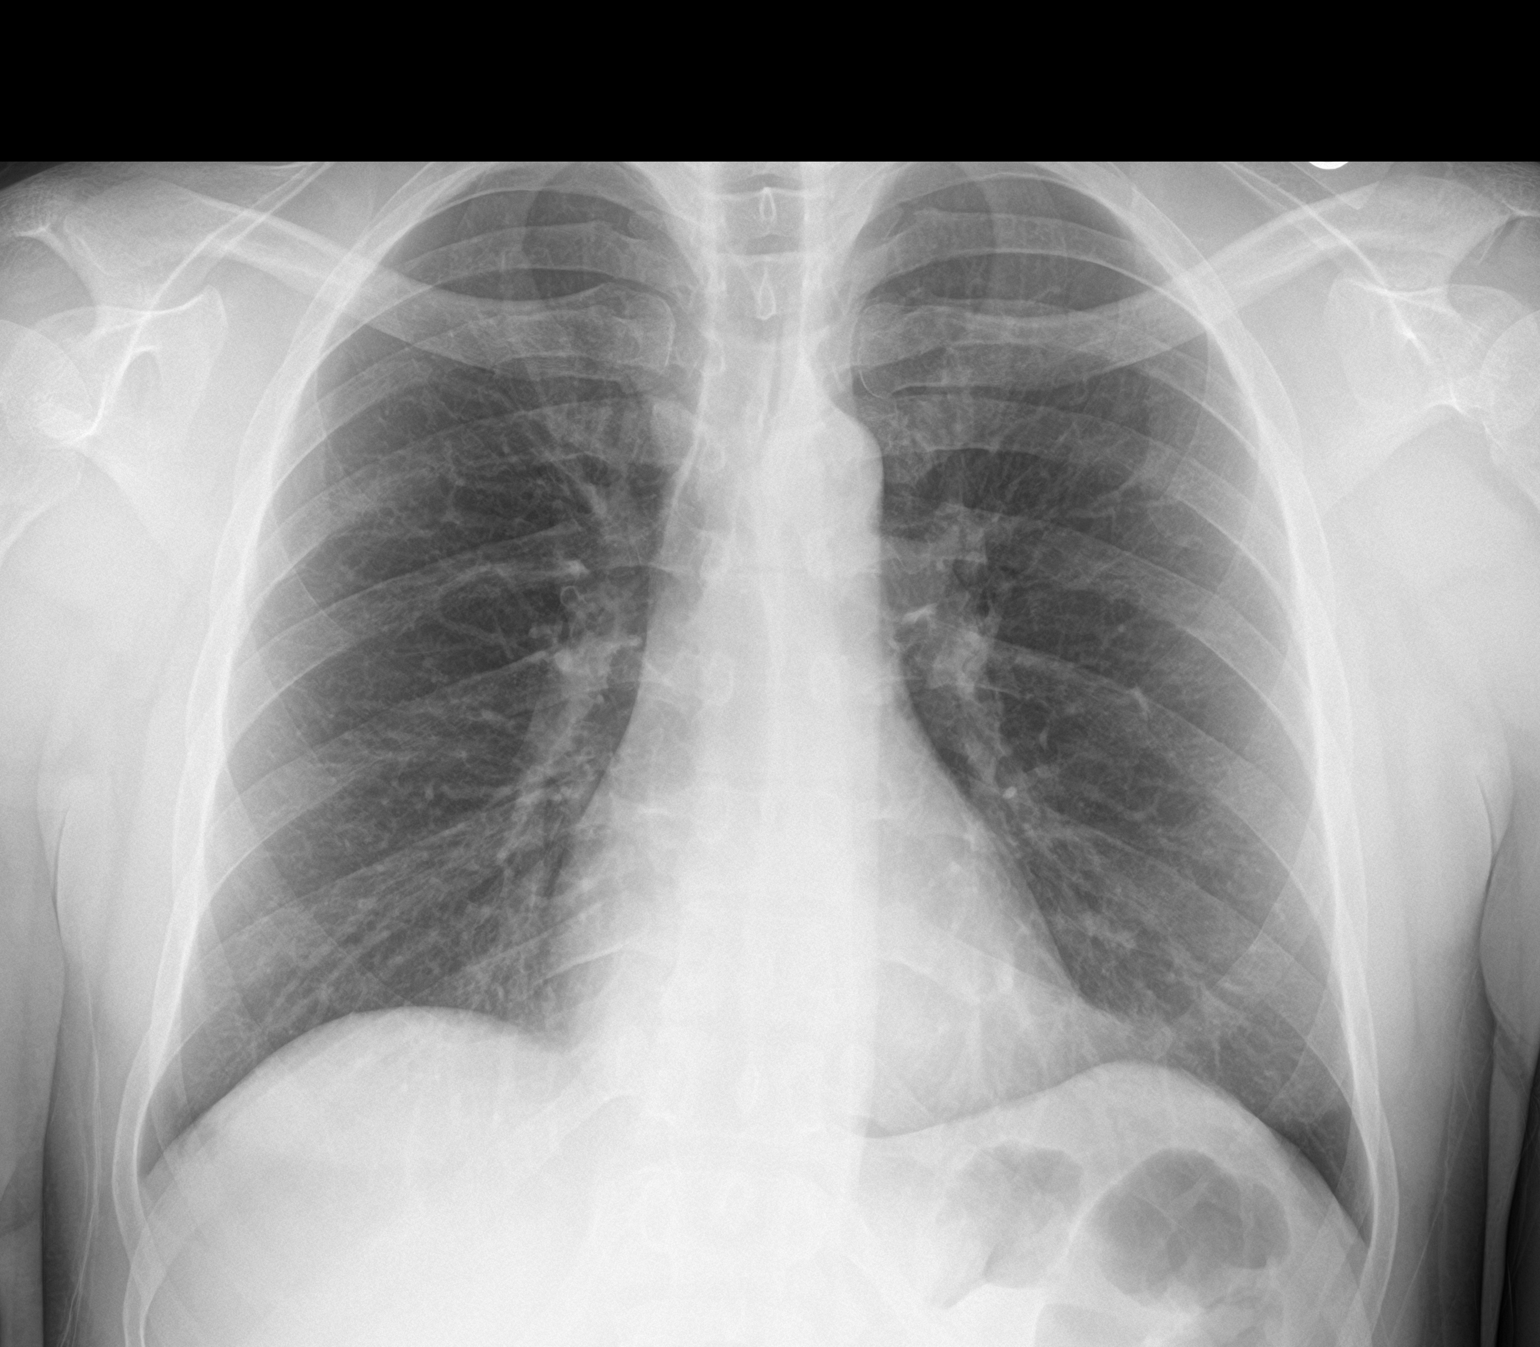

[chest lat]
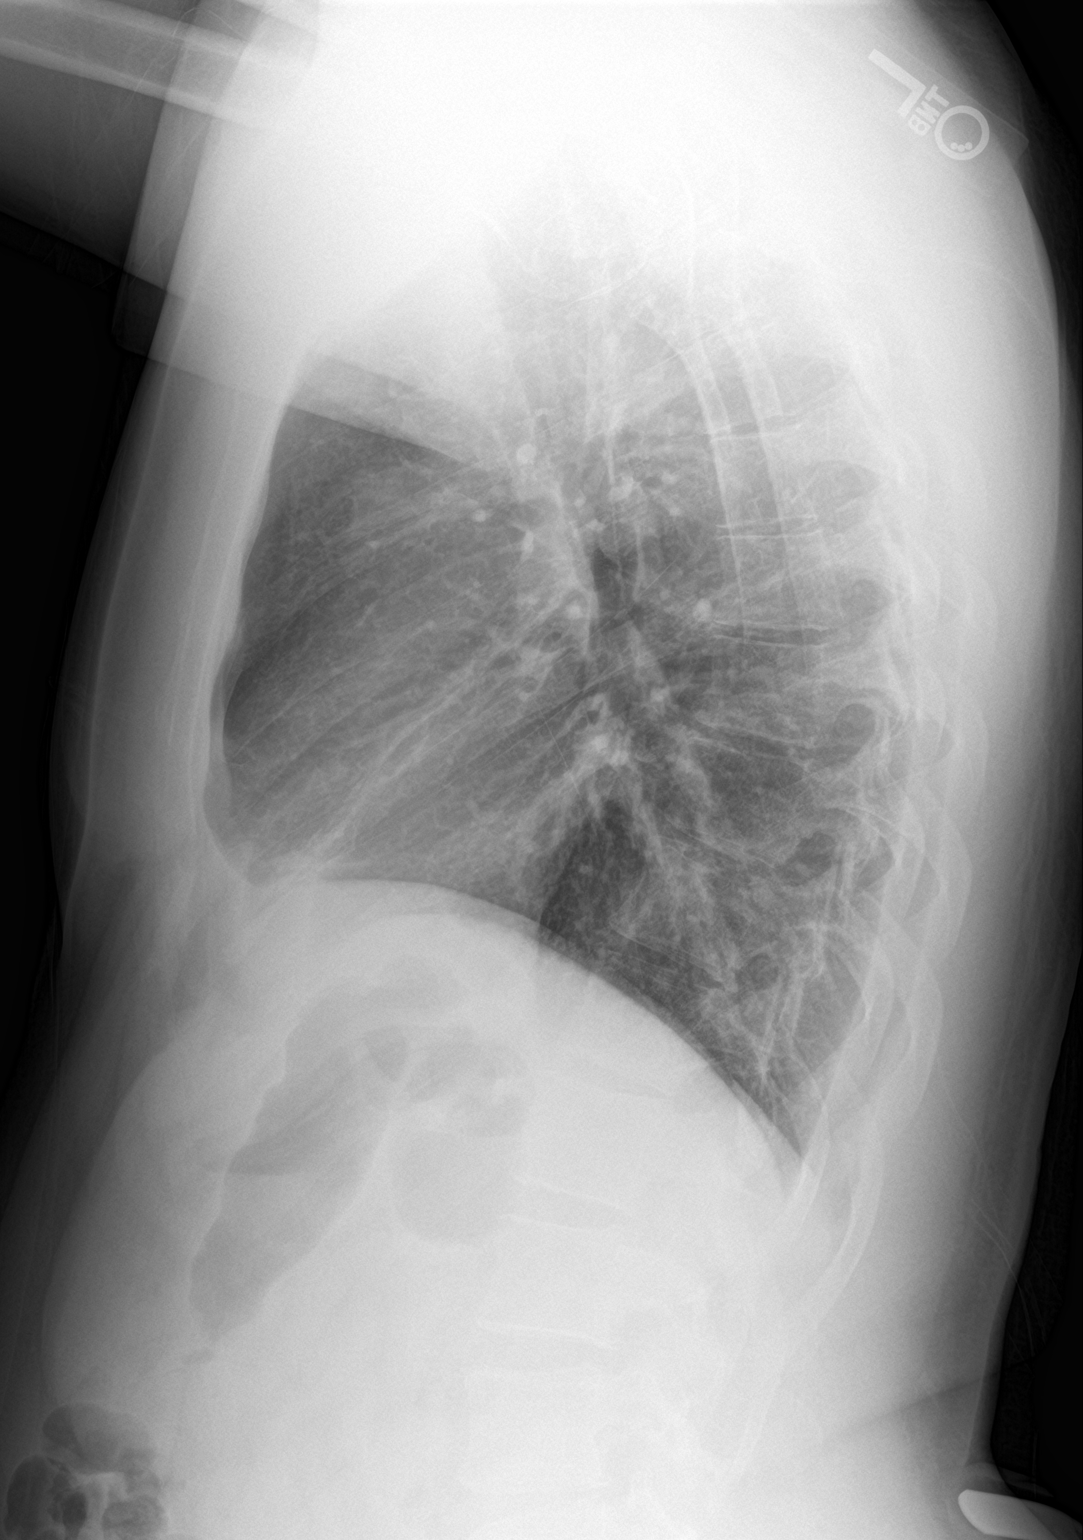

[2 of 2 positions shown; findings below may reference images not displayed]

FINDINGS: The heart size and mediastinal contours are within normal limits.
Both lungs are clear. The visualized skeletal structures are
unremarkable.
IMPRESSION: No acute abnormality of the lungs.

## 2021-07-08 ENCOUNTER — Emergency Department (HOSPITAL_COMMUNITY)
Admission: EM | Admit: 2021-07-08 | Discharge: 2021-07-08 | Disposition: A | Discharge status: Home / Self Care | Payer: Commercial Managed Care - HMO | Attending: Emergency Medicine | Admitting: Emergency Medicine

## 2021-07-08 ENCOUNTER — Other Ambulatory Visit: Payer: Self-pay

## 2021-07-08 ENCOUNTER — Encounter (HOSPITAL_COMMUNITY): Payer: Self-pay | Admitting: Emergency Medicine

## 2021-07-08 DIAGNOSIS — R220 Localized swelling, mass and lump, head: Secondary | ICD-10-CM | POA: Diagnosis present

## 2021-07-08 DIAGNOSIS — J45909 Unspecified asthma, uncomplicated: Secondary | ICD-10-CM | POA: Insufficient documentation

## 2021-07-08 DIAGNOSIS — L0291 Cutaneous abscess, unspecified: Secondary | ICD-10-CM

## 2021-07-08 DIAGNOSIS — L02811 Cutaneous abscess of head [any part, except face]: Secondary | ICD-10-CM | POA: Diagnosis not present

## 2021-07-08 DIAGNOSIS — Z7951 Long term (current) use of inhaled steroids: Secondary | ICD-10-CM | POA: Diagnosis not present

## 2021-07-08 MED ORDER — DOXYCYCLINE HYCLATE 100 MG PO CAPS: 100.0000 mg | ORAL_CAPSULE | Freq: Two times a day (BID) | ORAL | 0 refills | Status: DC

## 2021-07-08 MED ORDER — LIDOCAINE HCL (PF) 1 % IJ SOLN
5.0000 mL | Freq: Once | INTRAMUSCULAR | Status: AC
  Administered 2021-07-08: 5 mL
  Filled 2021-07-08: qty 30

## 2021-07-08 NOTE — ED Triage Notes (Signed)
Pt reports sore on back of head x a few months that comes and goes. Pt reports pain 8/10.

## 2021-07-08 NOTE — ED Provider Notes (Signed)
[facility] DEPT Provider Note   CSN: [id number] Arrival date & time: 07/08/21  8016     History  Chief Complaint  Patient presents with   Sore On Head     Maddex Garlitz is a 41 y.o. male with medical history of asthma, bronchitis, pneumonia, ulcerative colitis.  Patient presents ED for evaluation of "sore on head".  Patient reports that this sores been present for the last 2 to 3 days.  The patient states that he often gets the sores, sometimes that pop up in his groin, arms.  Patient denies any recent fevers, nausea, vomiting, diarrhea, body aches or chills.  HPI     Home Medications Prior to Admission medications   Medication Sig Start Date End Date Taking? Authorizing Provider  doxycycline (VIBRAMYCIN) 100 MG capsule Take 1 capsule (100 mg total) by mouth 2 (two) times daily. 07/08/21  Yes Azucena Cecil, PA-C  albuterol (VENTOLIN HFA) 108 (90 Base) MCG/ACT inhaler Inhale 2 puffs into the lungs every 4 (four) hours as needed for wheezing or shortness of breath. 10/15/19   Zigmund Gottron, NP  Budesonide 90 MCG/ACT inhaler Inhale 2 puffs into the lungs 2 (two) times daily. 10/15/19   Zigmund Gottron, NP  dicyclomine (BENTYL) 20 MG tablet Take 1 tablet (20 mg total) by mouth 4 (four) times daily -  before meals and at bedtime. As needed for abdominal cramping 10/15/19   Augusto Gamble B, NP  ipratropium-albuterol (DUONEB) 0.5-2.5 (3) MG/3ML SOLN SMARTSIG:1 Vial(s) Via Nebulizer 4 Times Daily PRN 01/26/19   [provider]  levocetirizine (XYZAL) 5 MG tablet Take 1 tablet (5 mg total) by mouth every evening. 05/07/20   Volney American, PA-C  omeprazole (PRILOSEC) 20 MG capsule Take 1 capsule (20 mg total) by mouth daily. 11/27/18   Raylene Everts, MD  predniSONE (DELTASONE) 20 MG tablet 3 tabs po day one, then 2 tabs daily x 4 days 08/24/20   Domenic Moras, PA-C  pseudoephedrine (SUDAFED) 60 MG tablet Take 1 tablet (60 mg total) by  mouth 2 (two) times daily as needed for congestion. 05/22/19   Jaynee Eagles, PA-C  sulfaSALAzine (AZULFIDINE) 500 MG tablet Take 2 tablets (1,000 mg total) by mouth 3 (three) times daily. 02/17/20 03/18/20  Hazel Sams, PA-C  cetirizine (ZYRTEC) 5 MG tablet Take 2 tablets (10 mg total) by mouth daily. 08/30/18 10/29/18  Langston Masker B, PA-C  ferrous sulfate 325 (65 FE) MG EC tablet Take 325 mg by mouth 3 (three) times daily with meals.  10/29/18  [provider]  fluticasone (FLONASE) 50 MCG/ACT nasal spray Place 1 spray into both nostrils daily as needed for allergies or rhinitis. 08/30/18 01/06/19  Langston Masker B, PA-C  Fluticasone-Salmeterol (ADVAIR) 100-50 MCG/DOSE AEPB Inhale 1 puff into the lungs 2 (two) times daily. 09/06/17 12/25/18  Wieters, Hallie C, PA-C  montelukast (SINGULAIR) 10 MG tablet Take 1 tablet (10 mg total) by mouth at bedtime. 12/13/18 01/06/19  Melynda Ripple, MD      Allergies    Patient has no known allergies.    Review of Systems   Review of Systems  Constitutional:  Negative for chills and fever.  Gastrointestinal:  Negative for diarrhea, nausea and vomiting.  Skin:        Abscess  All other systems reviewed and are negative.   Physical Exam Updated Vital Signs BP 122/85 (BP Location: Right Arm)   Pulse 69   Temp 98.1 F (36.7 C) (  Oral)   Resp 18   SpO2 94%  Physical Exam Vitals and nursing note reviewed.  Constitutional:      General: He is not in acute distress.    Appearance: Normal appearance. He is not ill-appearing, toxic-appearing or diaphoretic.  HENT:     Head: Normocephalic and atraumatic.      Nose: Nose normal. No congestion.     Mouth/Throat:     Mouth: Mucous membranes are moist.     Pharynx: Oropharynx is clear.  Eyes:     Extraocular Movements: Extraocular movements intact.     Conjunctiva/sclera: Conjunctivae normal.     Pupils: Pupils are equal, round, and reactive to light.  Cardiovascular:     Rate and Rhythm:  Normal rate and regular rhythm.  Pulmonary:     Effort: Pulmonary effort is normal.     Breath sounds: Normal breath sounds. No wheezing.  Abdominal:     General: Abdomen is flat. Bowel sounds are normal.     Palpations: Abdomen is soft.  Musculoskeletal:     Cervical back: Normal range of motion and neck supple. No tenderness.  Skin:    General: Skin is warm and dry.     Capillary Refill: Capillary refill takes less than 2 seconds.  Neurological:     Mental Status: He is alert and oriented to person, place, and time.     ED Results / Procedures / Treatments   Labs (all labs ordered are listed, but only abnormal results are displayed) Labs Reviewed - No data to display  EKG None  Radiology No results found.  Procedures .Marland KitchenIncision and Drainage  Date/Time: 07/08/2021 8:10 AM  Performed by: Azucena Cecil, PA-C Authorized by: Azucena Cecil, PA-C   Consent:    Consent obtained:  Verbal   Consent given by:  Patient   Risks, benefits, and alternatives were discussed: yes     Risks discussed:  Bleeding, damage to other organs, infection, incomplete drainage and pain   Alternatives discussed:  No treatment Universal protocol:    Patient identity confirmed:  Verbally with patient and arm band Location:    Type:  Abscess   Size:  2   Location:  Head   Head location:  Scalp Pre-procedure details:    Skin preparation:  Povidone-iodine Sedation:    Sedation type:  None Anesthesia:    Anesthesia method:  Local infiltration   Local anesthetic:  Lidocaine 1% w/o epi Procedure type:    Complexity:  Simple Procedure details:    Ultrasound guidance: no     Needle aspiration: no     Incision types:  Stab incision   Incision depth:  Dermal   Wound management:  Probed and deloculated and irrigated with saline   Drainage:  Purulent and bloody   Drainage amount:  Moderate   Packing materials:  None Post-procedure details:    Procedure completion:  Tolerated  well, no immediate complications    Medications Ordered in ED Medications  lidocaine (PF) (XYLOCAINE) 1 % injection 5 mL (5 mLs Other Given by Other 07/08/21 4098)    ED Course/ Medical Decision Making/ A&P                           Medical Decision Making  41 year old male presents to the ED for evaluation.  Please see HPI for further details.  Patient with 2 cm fluctuant abscess to crown of head.  Abscess drained per procedure note.  Patient placed on 7 days doxycycline.  Patient advised to follow-up with PCP for further management of recurrent abscesses.  Patient given return precautions and voiced understanding.  Patient had all of his questions answered to his satisfaction.  The patient is stable this time for discharge home.   Final Clinical Impression(s) / ED Diagnoses Final diagnoses:  Abscess    Rx / DC Orders ED Discharge Orders          Ordered    doxycycline (VIBRAMYCIN) 100 MG capsule  2 times daily        07/08/21 0809              Azucena Cecil, PA-C 73/41/93 7902    Lianne Cure, DO 40/97/35 0830

## 2021-07-08 NOTE — Discharge Instructions (Addendum)
Please return to the ED with any new symptoms such as fevers Please follow-up with your primary care doctor for further evaluation of abscesses Please pick up prescription doxycycline that I have sent in for you Please read the attached informational guide about the abscesses

## 2021-08-12 ENCOUNTER — Other Ambulatory Visit: Payer: Self-pay

## 2021-08-12 ENCOUNTER — Encounter (HOSPITAL_COMMUNITY): Payer: Self-pay | Admitting: Emergency Medicine

## 2021-08-12 ENCOUNTER — Emergency Department (HOSPITAL_COMMUNITY)
Admission: EM | Admit: 2021-08-12 | Discharge: 2021-08-12 | Disposition: A | Discharge status: Home / Self Care | Payer: Commercial Managed Care - HMO | Attending: Emergency Medicine | Admitting: Emergency Medicine

## 2021-08-12 DIAGNOSIS — Z7951 Long term (current) use of inhaled steroids: Secondary | ICD-10-CM | POA: Insufficient documentation

## 2021-08-12 DIAGNOSIS — R0602 Shortness of breath: Secondary | ICD-10-CM | POA: Insufficient documentation

## 2021-08-12 DIAGNOSIS — J45909 Unspecified asthma, uncomplicated: Secondary | ICD-10-CM | POA: Insufficient documentation

## 2021-08-12 MED ORDER — ALBUTEROL SULFATE HFA 108 (90 BASE) MCG/ACT IN AERS
1.0000 | INHALATION_SPRAY | Freq: Once | RESPIRATORY_TRACT | Status: AC
  Administered 2021-08-12: 2 via RESPIRATORY_TRACT
  Filled 2021-08-12: qty 6.7

## 2021-08-12 MED ORDER — ALBUTEROL SULFATE HFA 108 (90 BASE) MCG/ACT IN AERS: 2.0000 | INHALATION_SPRAY | RESPIRATORY_TRACT | 0 refills | Status: DC | PRN

## 2021-08-12 NOTE — Discharge Instructions (Signed)
Please continue use your inhaler as needed for shortness of breath.  Please return to the emergency department if your symptoms worsen instead of improve.

## 2021-08-12 NOTE — ED Triage Notes (Signed)
Patient c/o SOB x2 weeks. States that he has bronchitis and is out of his inhaler.

## 2021-08-12 NOTE — ED Provider Notes (Signed)
[facility] DEPT Provider Note   CSN: [id number] Arrival date & time: 08/12/21  2031     History  Chief Complaint  Patient presents with   Shortness of Breath    Jerome Irwin is a 41 y.o. male with past medical history significant for asthma, bronchitis who presents with concern for shortness of breath which he thinks is exacerbated due to the fact that his areas out at home.  He reports that he is out of his inhaler.  He is requesting an inhaler at this time.   Shortness of Breath      Home Medications Prior to Admission medications   Medication Sig Start Date End Date Taking? Authorizing Provider  albuterol (VENTOLIN HFA) 108 (90 Base) MCG/ACT inhaler Inhale 2 puffs into the lungs every 4 (four) hours as needed for wheezing or shortness of breath. 08/12/21   Isaih Bulger H, PA-C  Budesonide 90 MCG/ACT inhaler Inhale 2 puffs into the lungs 2 (two) times daily. 10/15/19   Zigmund Gottron, NP  dicyclomine (BENTYL) 20 MG tablet Take 1 tablet (20 mg total) by mouth 4 (four) times daily -  before meals and at bedtime. As needed for abdominal cramping 10/15/19   Augusto Gamble B, NP  doxycycline (VIBRAMYCIN) 100 MG capsule Take 1 capsule (100 mg total) by mouth 2 (two) times daily. 07/08/21   Azucena Cecil, PA-C  ipratropium-albuterol (DUONEB) 0.5-2.5 (3) MG/3ML SOLN SMARTSIG:1 Vial(s) Via Nebulizer 4 Times Daily PRN 01/26/19   [provider]  levocetirizine (XYZAL) 5 MG tablet Take 1 tablet (5 mg total) by mouth every evening. 05/07/20   Volney American, PA-C  omeprazole (PRILOSEC) 20 MG capsule Take 1 capsule (20 mg total) by mouth daily. 11/27/18   Raylene Everts, MD  predniSONE (DELTASONE) 20 MG tablet 3 tabs po day one, then 2 tabs daily x 4 days 08/24/20   Domenic Moras, PA-C  pseudoephedrine (SUDAFED) 60 MG tablet Take 1 tablet (60 mg total) by mouth 2 (two) times daily as needed for congestion. 05/22/19   Jaynee Eagles,  PA-C  sulfaSALAzine (AZULFIDINE) 500 MG tablet Take 2 tablets (1,000 mg total) by mouth 3 (three) times daily. 02/17/20 03/18/20  Hazel Sams, PA-C  cetirizine (ZYRTEC) 5 MG tablet Take 2 tablets (10 mg total) by mouth daily. 08/30/18 10/29/18  Langston Masker B, PA-C  ferrous sulfate 325 (65 FE) MG EC tablet Take 325 mg by mouth 3 (three) times daily with meals.  10/29/18  [provider]  fluticasone (FLONASE) 50 MCG/ACT nasal spray Place 1 spray into both nostrils daily as needed for allergies or rhinitis. 08/30/18 01/06/19  Langston Masker B, PA-C  Fluticasone-Salmeterol (ADVAIR) 100-50 MCG/DOSE AEPB Inhale 1 puff into the lungs 2 (two) times daily. 09/06/17 12/25/18  Wieters, Hallie C, PA-C  montelukast (SINGULAIR) 10 MG tablet Take 1 tablet (10 mg total) by mouth at bedtime. 12/13/18 01/06/19  Melynda Ripple, MD      Allergies    Patient has no known allergies.    Review of Systems   Review of Systems  Respiratory:  Positive for shortness of breath.   All other systems reviewed and are negative.   Physical Exam Updated Vital Signs BP 121/79 (BP Location: Right Arm)   Pulse 88   Temp 98.1 F (36.7 C) (Oral)   Resp 16   SpO2 95%  Physical Exam Vitals and nursing note reviewed.  Constitutional:      General: He is not in acute  distress.    Appearance: Normal appearance.  HENT:     Head: Normocephalic and atraumatic.  Eyes:     General:        Right eye: No discharge.        Left eye: No discharge.  Cardiovascular:     Rate and Rhythm: Normal rate and regular rhythm.  Pulmonary:     Effort: Pulmonary effort is normal. No respiratory distress.     Comments: Very minimal end expiratory wheezing, no significant respiratory distress, no accessory breath sounds.  Stable oxygen saturation on room air. Musculoskeletal:        General: No deformity.  Skin:    General: Skin is warm and dry.  Neurological:     Mental Status: He is alert and oriented to person, place, and  time.  Psychiatric:        Mood and Affect: Mood normal.        Behavior: Behavior normal.     ED Results / Procedures / Treatments   Labs (all labs ordered are listed, but only abnormal results are displayed) Labs Reviewed - No data to display  EKG None  Radiology No results found.  Procedures Procedures    Medications Ordered in ED Medications  albuterol (VENTOLIN HFA) 108 (90 Base) MCG/ACT inhaler 1-2 puff (has no administration in time range)    ED Course/ Medical Decision Making/ A&P                           Medical Decision Making Risk Prescription drug management.   This an overall well-appearing patient who presents with concern for some shortness of breath in context of known bronchitis, asthma.  He reports that he is out of his inhaler, requesting inhaler here.  I think this is reasonable.  I will also refill his home inhaler.  He is asking whether or not he needs steroids.  He is not having significant wheezing, is in no respiratory distress.  Discussed that I do not recommend treatment with steroids at this time.  Patient understands and agrees to this plan.  Given severity of his symptoms at this time I think that it is reasonable to discharge with albuterol refill only and no further work-up.  Patient understands and agrees to this plan.  Extensive return precautions given.  He is discharged in stable condition at this time.  Vital signs are stable on room air. Final Clinical Impression(s) / ED Diagnoses Final diagnoses:  None    Rx / DC Orders ED Discharge Orders          Ordered    albuterol (VENTOLIN HFA) 108 (90 Base) MCG/ACT inhaler  Every 4 hours PRN        08/12/21 2109              Anselmo Pickler, PA-C 08/12/21 2112    Lennice Sites, DO 08/12/21 2152

## 2021-08-13 ENCOUNTER — Ambulatory Visit (HOSPITAL_COMMUNITY)
Admission: EM | Admit: 2021-08-13 | Discharge: 2021-08-13 | Discharge status: Absent without leave | Payer: Commercial Managed Care - HMO

## 2021-08-13 NOTE — BH Assessment (Signed)
Pt reports depression and looking to establish outpt services and reports he is in the process of getting disability. Pt denies SI, HI, AVH and denies that he is in crisis. Pt given the option to go to second floor for outpt services or remain on 1st floor for crisis assessment. TTS explained services on both floors and pt decided to go upstairs for treatment.

## 2021-12-08 ENCOUNTER — Emergency Department (HOSPITAL_COMMUNITY): Payer: Commercial Managed Care - HMO

## 2021-12-08 ENCOUNTER — Encounter (HOSPITAL_COMMUNITY): Payer: Self-pay | Admitting: Emergency Medicine

## 2021-12-08 ENCOUNTER — Emergency Department (HOSPITAL_COMMUNITY)
Admission: EM | Admit: 2021-12-08 | Discharge: 2021-12-08 | Disposition: A | Discharge status: Home / Self Care | Payer: Commercial Managed Care - HMO | Attending: Emergency Medicine | Admitting: Emergency Medicine

## 2021-12-08 ENCOUNTER — Other Ambulatory Visit: Payer: Self-pay

## 2021-12-08 DIAGNOSIS — Z7951 Long term (current) use of inhaled steroids: Secondary | ICD-10-CM | POA: Diagnosis not present

## 2021-12-08 DIAGNOSIS — J45909 Unspecified asthma, uncomplicated: Secondary | ICD-10-CM | POA: Diagnosis not present

## 2021-12-08 DIAGNOSIS — M546 Pain in thoracic spine: Secondary | ICD-10-CM | POA: Diagnosis present

## 2021-12-08 LAB — CBC WITH DIFFERENTIAL/PLATELET
Abs Immature Granulocytes: 0.01 10*3/uL (ref 0.00–0.07)
Basophils Absolute: 0.1 10*3/uL (ref 0.0–0.1)
Basophils Relative: 2 %
Eosinophils Absolute: 0.3 10*3/uL (ref 0.0–0.5)
Eosinophils Relative: 7 %
HCT: 33.7 % — ABNORMAL LOW (ref 39.0–52.0)
Hemoglobin: 10.2 g/dL — ABNORMAL LOW (ref 13.0–17.0)
Immature Granulocytes: 0 %
Lymphocytes Relative: 55 %
Lymphs Abs: 2.6 10*3/uL (ref 0.7–4.0)
MCH: 21.1 pg — ABNORMAL LOW (ref 26.0–34.0)
MCHC: 30.3 g/dL (ref 30.0–36.0)
MCV: 69.8 fL — ABNORMAL LOW (ref 80.0–100.0)
Monocytes Absolute: 0.6 10*3/uL (ref 0.1–1.0)
Monocytes Relative: 14 %
Neutro Abs: 1 10*3/uL — ABNORMAL LOW (ref 1.7–7.7)
Neutrophils Relative %: 22 %
Platelets: 434 10*3/uL — ABNORMAL HIGH (ref 150–400)
RBC: 4.83 MIL/uL (ref 4.22–5.81)
RDW: 19.7 % — ABNORMAL HIGH (ref 11.5–15.5)
WBC: 4.7 10*3/uL (ref 4.0–10.5)
nRBC: 0 % (ref 0.0–0.2)

## 2021-12-08 LAB — LIPASE, BLOOD: Lipase: 47 U/L (ref 11–51)

## 2021-12-08 LAB — COMPREHENSIVE METABOLIC PANEL
ALT: 35 U/L (ref 0–44)
AST: 37 U/L (ref 15–41)
Albumin: 3.7 g/dL (ref 3.5–5.0)
Alkaline Phosphatase: 114 U/L (ref 38–126)
Anion gap: 10 (ref 5–15)
BUN: 6 mg/dL (ref 6–20)
CO2: 27 mmol/L (ref 22–32)
Calcium: 9.1 mg/dL (ref 8.9–10.3)
Chloride: 103 mmol/L (ref 98–111)
Creatinine, Ser: 1 mg/dL (ref 0.61–1.24)
GFR, Estimated: >60 mL/min (ref >60)
Glucose, Bld: 102 mg/dL — ABNORMAL HIGH (ref 70–99)
Potassium: 3.4 mmol/L — ABNORMAL LOW (ref 3.5–5.1)
Sodium: 140 mmol/L (ref 135–145)
Total Bilirubin: 0.3 mg/dL (ref 0.3–1.2)
Total Protein: 7.4 g/dL (ref 6.5–8.1)

## 2021-12-08 LAB — TROPONIN I (HIGH SENSITIVITY)
Troponin I (High Sensitivity): 4 ng/L (ref <18)
Troponin I (High Sensitivity): 3 ng/L (ref <18)

## 2021-12-08 MED ORDER — ACETAMINOPHEN 325 MG PO TABS
650.0000 mg | ORAL_TABLET | Freq: Once | ORAL | Status: AC
  Administered 2021-12-08: 650 mg via ORAL
  Filled 2021-12-08: qty 2

## 2021-12-08 MED ORDER — METHOCARBAMOL 500 MG PO TABS
1000.0000 mg | ORAL_TABLET | Freq: Once | ORAL | Status: AC
  Administered 2021-12-08: 1000 mg via ORAL
  Filled 2021-12-08: qty 2

## 2021-12-08 MED ORDER — POTASSIUM CHLORIDE CRYS ER 20 MEQ PO TBCR
40.0000 meq | EXTENDED_RELEASE_TABLET | Freq: Once | ORAL | Status: AC
  Administered 2021-12-08: 40 meq via ORAL
  Filled 2021-12-08: qty 2

## 2021-12-08 MED ORDER — ALBUTEROL SULFATE HFA 108 (90 BASE) MCG/ACT IN AERS
2.0000 | INHALATION_SPRAY | Freq: Once | RESPIRATORY_TRACT | Status: AC
  Administered 2021-12-08: 2 via RESPIRATORY_TRACT
  Filled 2021-12-08: qty 6.7

## 2021-12-08 MED ORDER — MAGNESIUM OXIDE -MG SUPPLEMENT 400 (240 MG) MG PO TABS
800.0000 mg | ORAL_TABLET | Freq: Once | ORAL | Status: AC
  Administered 2021-12-08: 800 mg via ORAL
  Filled 2021-12-08: qty 2

## 2021-12-08 MED ORDER — METHOCARBAMOL 500 MG PO TABS: 500.0000 mg | ORAL_TABLET | Freq: Three times a day (TID) | ORAL | 0 refills | Status: DC | PRN

## 2021-12-08 MED ORDER — LIDOCAINE 5 % EX PTCH
1.0000 | MEDICATED_PATCH | CUTANEOUS | Status: DC
  Administered 2021-12-08: 1 via TRANSDERMAL
  Filled 2021-12-08: qty 1

## 2021-12-08 MED ORDER — KETOROLAC TROMETHAMINE 60 MG/2ML IM SOLN
30.0000 mg | Freq: Once | INTRAMUSCULAR | Status: AC
  Administered 2021-12-08: 30 mg via INTRAMUSCULAR
  Filled 2021-12-08: qty 2

## 2021-12-08 NOTE — ED Provider Triage Note (Signed)
Emergency Medicine Provider Triage Evaluation Note  Jerome Irwin , a 41 y.o. male  was evaluated in triage.  Pt complains of here for evaluation of midline thoracic back pain.  Worse with movement.  Feels that he cannot sit still due to the pain.  No shortness of breath however states he has a "upper respiratory infection due to smoking."  No fever, emesis, abdominal pain.  Review of Systems  Positive: Back pain Negative:   Physical Exam  There were no vitals taken for this visit. Gen:   Awake, no distress   Resp:  Normal effort  MSK:   Moves extremities without difficulty  Other:    Medical Decision Making  Medically screening exam initiated at 9:14 AM.  Appropriate orders placed.  Jerome Irwin was informed that the remainder of the evaluation will be completed by another provider, this initial triage assessment does not replace that evaluation, and the importance of remaining in the ED until their evaluation is complete.  Back Pain   Mandie Crabbe A, PA-C 12/08/21 0915

## 2021-12-08 NOTE — ED Provider Notes (Signed)
[facility] EMERGENCY DEPARTMENT Provider Note   CSN: [id number] Arrival date & time: 12/08/21  8466     History  Chief Complaint  Patient presents with   Back Pain    Jerome Irwin is a 41 y.o. male.   Back Pain Patient presents for back pain.  Medical history includes asthma, bronchitis, ulcerative colitis.  He denies any recent injuries.  He states that he woke up in his normal state of health.  Shortly after waking up, he began to experience pain in the right thoracic area of his back.  This pain is worsened with any movements.  He has not tried any over-the-counter or prescription medications for management of the pain.     Home Medications Prior to Admission medications   Medication Sig Start Date End Date Taking? Authorizing Provider  methocarbamol (ROBAXIN) 500 MG tablet Take 1 tablet (500 mg total) by mouth every 8 (eight) hours as needed for muscle spasms. 12/08/21  Yes Godfrey Pick, MD  albuterol (VENTOLIN HFA) 108 (90 Base) MCG/ACT inhaler Inhale 2 puffs into the lungs every 4 (four) hours as needed for wheezing or shortness of breath. 08/12/21   Prosperi, Christian H, PA-C  Budesonide 90 MCG/ACT inhaler Inhale 2 puffs into the lungs 2 (two) times daily. 10/15/19   Zigmund Gottron, NP  dicyclomine (BENTYL) 20 MG tablet Take 1 tablet (20 mg total) by mouth 4 (four) times daily -  before meals and at bedtime. As needed for abdominal cramping 10/15/19   Augusto Gamble B, NP  doxycycline (VIBRAMYCIN) 100 MG capsule Take 1 capsule (100 mg total) by mouth 2 (two) times daily. 07/08/21   Azucena Cecil, PA-C  ipratropium-albuterol (DUONEB) 0.5-2.5 (3) MG/3ML SOLN SMARTSIG:1 Vial(s) Via Nebulizer 4 Times Daily PRN 01/26/19   [provider]  levocetirizine (XYZAL) 5 MG tablet Take 1 tablet (5 mg total) by mouth every evening. 05/07/20   Volney American, PA-C  omeprazole (PRILOSEC) 20 MG capsule Take 1 capsule (20 mg total) by mouth daily.  11/27/18   Raylene Everts, MD  predniSONE (DELTASONE) 20 MG tablet 3 tabs po day one, then 2 tabs daily x 4 days 08/24/20   Domenic Moras, PA-C  pseudoephedrine (SUDAFED) 60 MG tablet Take 1 tablet (60 mg total) by mouth 2 (two) times daily as needed for congestion. 05/22/19   Jaynee Eagles, PA-C  sulfaSALAzine (AZULFIDINE) 500 MG tablet Take 2 tablets (1,000 mg total) by mouth 3 (three) times daily. 02/17/20 03/18/20  Hazel Sams, PA-C  cetirizine (ZYRTEC) 5 MG tablet Take 2 tablets (10 mg total) by mouth daily. 08/30/18 10/29/18  Langston Masker B, PA-C  ferrous sulfate 325 (65 FE) MG EC tablet Take 325 mg by mouth 3 (three) times daily with meals.  10/29/18  [provider]  fluticasone (FLONASE) 50 MCG/ACT nasal spray Place 1 spray into both nostrils daily as needed for allergies or rhinitis. 08/30/18 01/06/19  Langston Masker B, PA-C  Fluticasone-Salmeterol (ADVAIR) 100-50 MCG/DOSE AEPB Inhale 1 puff into the lungs 2 (two) times daily. 09/06/17 12/25/18  Wieters, Hallie C, PA-C  montelukast (SINGULAIR) 10 MG tablet Take 1 tablet (10 mg total) by mouth at bedtime. 12/13/18 01/06/19  Melynda Ripple, MD      Allergies    Patient has no known allergies.    Review of Systems   Review of Systems  Musculoskeletal:  Positive for back pain and myalgias.  All other systems reviewed and are negative.   Physical Exam  Updated Vital Signs BP (!) 128/96   Pulse 74   Temp 98.3 F (36.8 C)   Resp 18   SpO2 98%  Physical Exam Vitals and nursing note reviewed.  Constitutional:      General: He is not in acute distress.    Appearance: Normal appearance. He is well-developed. He is not ill-appearing, toxic-appearing or diaphoretic.  HENT:     Head: Normocephalic and atraumatic.     Right Ear: External ear normal.     Left Ear: External ear normal.     Nose: Nose normal.     Mouth/Throat:     Mouth: Mucous membranes are moist.     Pharynx: Oropharynx is clear.  Eyes:     Extraocular  Movements: Extraocular movements intact.     Conjunctiva/sclera: Conjunctivae normal.  Cardiovascular:     Rate and Rhythm: Normal rate and regular rhythm.  Pulmonary:     Effort: Pulmonary effort is normal. No respiratory distress.  Abdominal:     General: There is no distension.     Palpations: Abdomen is soft.     Tenderness: There is no abdominal tenderness.  Musculoskeletal:        General: Tenderness (Right thoracic back) present. No swelling. Normal range of motion.     Cervical back: Normal range of motion and neck supple.     Right lower leg: No edema.     Left lower leg: No edema.  Skin:    General: Skin is warm and dry.     Capillary Refill: Capillary refill takes less than 2 seconds.     Coloration: Skin is not jaundiced or pale.  Neurological:     General: No focal deficit present.     Mental Status: He is alert and oriented to person, place, and time.     Cranial Nerves: No cranial nerve deficit.     Sensory: No sensory deficit.     Motor: No weakness.     Coordination: Coordination normal.  Psychiatric:        Mood and Affect: Mood normal.        Behavior: Behavior normal.        Thought Content: Thought content normal.        Judgment: Judgment normal.     ED Results / Procedures / Treatments   Labs (all labs ordered are listed, but only abnormal results are displayed) Labs Reviewed  CBC WITH DIFFERENTIAL/PLATELET - Abnormal; Notable for the following components:      Result Value   Hemoglobin 10.2 (*)    HCT 33.7 (*)    MCV 69.8 (*)    MCH 21.1 (*)    RDW 19.7 (*)    Platelets 434 (*)    Neutro Abs 1.0 (*)    All other components within normal limits  COMPREHENSIVE METABOLIC PANEL - Abnormal; Notable for the following components:   Potassium 3.4 (*)    Glucose, Bld 102 (*)    All other components within normal limits  LIPASE, BLOOD  TROPONIN I (HIGH SENSITIVITY)  TROPONIN I (HIGH SENSITIVITY)    EKG EKG Interpretation  Date/Time:  Saturday  December 08 2021 09:21:58 EST Ventricular Rate:  76 PR Interval:  132 QRS Duration: 96 QT Interval:  376 QTC Calculation: 423 R Axis:   40 Text Interpretation: Normal sinus rhythm Cannot rule out Anterior infarct , age undetermined Abnormal ECG Confirmed by Godfrey Pick [phone]) on 12/08/2021 1:51:50 PM  Radiology DG Thoracic Spine 2 View  Result  Date: 12/08/2021 CLINICAL DATA:  Back pain EXAM: THORACIC SPINE 2 VIEWS COMPARISON:  None Available. FINDINGS: There is no evidence of thoracic spine fracture. Alignment is normal. Mild thoracic scoliosis. No other significant bone abnormalities are identified. IMPRESSION: Negative. Electronically Signed   By: Beryle Flock M.D.   On: 12/08/2021 09:58   DG Chest 2 View  Result Date: 12/08/2021 CLINICAL DATA:  Pleuritic chest pain. EXAM: CHEST - 2 VIEW COMPARISON:  Chest x-ray January 17, 2019 FINDINGS: The cardiomediastinal silhouette is unchanged in contour. No focal pulmonary opacity. No pleural effusion or pneumothorax. The visualized upper abdomen is unremarkable. No acute osseous abnormality. IMPRESSION: No acute cardiopulmonary abnormality. Electronically Signed   By: Beryle Flock M.D.   On: 12/08/2021 09:56    Procedures Procedures    Medications Ordered in ED Medications  lidocaine (LIDODERM) 5 % 1 patch (1 patch Transdermal Patch Applied 12/08/21 1505)  albuterol (VENTOLIN HFA) 108 (90 Base) MCG/ACT inhaler 2 puff (has no administration in time range)  potassium chloride SA (KLOR-CON M) CR tablet 40 mEq (40 mEq Oral Given 12/08/21 1504)  magnesium oxide (MAG-OX) tablet 800 mg (800 mg Oral Given 12/08/21 1504)  ketorolac (TORADOL) injection 30 mg (30 mg Intramuscular Given 12/08/21 1504)  methocarbamol (ROBAXIN) tablet 1,000 mg (1,000 mg Oral Given 12/08/21 1504)  acetaminophen (TYLENOL) tablet 650 mg (650 mg Oral Given 12/08/21 1504)    ED Course/ Medical Decision Making/ A&P                           Medical Decision  Making Risk OTC drugs. Prescription drug management.   Patient presents for right thoracic back pain.  Onset was earlier this morning.  Pain is worsened with any movements.  He has not tried any medications for analgesia.  Prior to being bedded in the ED, patient underwent diagnostic work-up.  Lab work shows slight hypokalemia with otherwise normal electrolytes, no leukocytosis, normal lipase, normal troponin.  X-rays were unremarkable.  On assessment, patient is well-appearing.  He has tenderness to area of pain.  I suspect musculoskeletal etiology.  Will provide magnesium and potassium for electrolyte optimization.  Patient to receive Toradol, muscle relaxer, and lidocaine patch for analgesia.  On reassessment, patient had improved symptoms.  He was advised to continue Tylenol and ibuprofen.  He was prescribed muscle relaxer to take as needed.  He was discharged in good condition.        Final Clinical Impression(s) / ED Diagnoses Final diagnoses:  Acute right-sided thoracic back pain    Rx / DC Orders ED Discharge Orders          Ordered    methocarbamol (ROBAXIN) 500 MG tablet  Every 8 hours PRN        12/08/21 1528              Godfrey Pick, MD 12/08/21 1529

## 2021-12-08 NOTE — ED Triage Notes (Signed)
Patient w/ back pain upper R side that he noticed this morning.  Denies chest pain, SHOB. States he is battling a respiratory infection at this time. Denies fevers, chills, coughing.

## 2021-12-08 NOTE — Discharge Instructions (Signed)
Take ibuprofen and Tylenol for continued pain, soreness, and tightness.  Additionally, there was a prescription sent to pharmacy for a muscle relaxer.  Take this medication as needed.  Continue to move within the tolerance of your pain.  Avoid activities that worsen your pain.  Return to the emergency department for any new or worsening symptoms of concern.

## 2022-02-06 ENCOUNTER — Ambulatory Visit (HOSPITAL_COMMUNITY)
Admission: EM | Admit: 2022-02-06 | Discharge: 2022-02-06 | Disposition: A | Discharge status: Home / Self Care | Payer: Commercial Managed Care - HMO | Attending: Emergency Medicine | Admitting: Emergency Medicine

## 2022-02-06 ENCOUNTER — Encounter (HOSPITAL_COMMUNITY): Payer: Self-pay

## 2022-02-06 DIAGNOSIS — K5190 Ulcerative colitis, unspecified, without complications: Secondary | ICD-10-CM

## 2022-02-06 MED ORDER — DICYCLOMINE HCL 20 MG PO TABS: 20.0000 mg | ORAL_TABLET | Freq: Two times a day (BID) | ORAL | 0 refills | Status: DC

## 2022-02-06 MED ORDER — PREDNISONE 10 MG (21) PO TBPK: ORAL_TABLET | Freq: Every day | ORAL | 0 refills | Status: DC

## 2022-02-06 NOTE — ED Provider Notes (Signed)
[facility]    CSN: [id number] Arrival date & time: 02/06/22  1022      History   Chief Complaint Chief Complaint  Patient presents with   Abdominal Pain    HPI Jerome Irwin is a 42 y.o. male.   Patient presents for evaluation of intermittent diarrhea and constipation occurring for 2 to 3 weeks.  Endorses blood and mucous in the stool.  Associated abdominal bloating .,  Abdominal pain is centralized occurring intermittently described as cramping.  Last bowel movement earlier this morning, feels constipated, endorses he has had the urge to go to the bathroom several times but only small amounts come out.  Tolerating food and liquids.  Denies fevers, nausea or vomiting.  History of ulcerative colitis and believes symptoms have cleared, endorses typically has a flareup when temperature decreases.    Past Medical History:  Diagnosis Date   Asthma    Bronchitis    Pneumonia    Ulcerative colitis (HCC)     There are no problems to display for this patient.   Past Surgical History:  Procedure Laterality Date   COLONOSCOPY WITH PROPOFOL N/A 02/21/2016   Procedure: COLONOSCOPY WITH PROPOFOL;  Surgeon: Willis Modena, MD;  Location: [facility];  Service: Endoscopy;  Laterality: N/A;       Home Medications    Prior to Admission medications   Medication Sig Start Date End Date Taking? Authorizing Provider  albuterol (VENTOLIN HFA) 108 (90 Base) MCG/ACT inhaler Inhale 2 puffs into the lungs every 4 (four) hours as needed for wheezing or shortness of breath. 08/12/21   Prosperi, Christian H, PA-C  Budesonide 90 MCG/ACT inhaler Inhale 2 puffs into the lungs 2 (two) times daily. 10/15/19   Georgetta Haber, NP  dicyclomine (BENTYL) 20 MG tablet Take 1 tablet (20 mg total) by mouth 4 (four) times daily -  before meals and at bedtime. As needed for abdominal cramping 10/15/19   Linus Mako B, NP  doxycycline (VIBRAMYCIN) 100 MG capsule Take 1 capsule (100 mg total) by  mouth 2 (two) times daily. 07/08/21   Al Decant, PA-C  ipratropium-albuterol (DUONEB) 0.5-2.5 (3) MG/3ML SOLN SMARTSIG:1 Vial(s) Via Nebulizer 4 Times Daily PRN 01/26/19   [provider]  levocetirizine (XYZAL) 5 MG tablet Take 1 tablet (5 mg total) by mouth every evening. 05/07/20   Particia Nearing, PA-C  methocarbamol (ROBAXIN) 500 MG tablet Take 1 tablet (500 mg total) by mouth every 8 (eight) hours as needed for muscle spasms. 12/08/21   Gloris Manchester, MD  omeprazole (PRILOSEC) 20 MG capsule Take 1 capsule (20 mg total) by mouth daily. 11/27/18   Eustace Moore, MD  predniSONE (DELTASONE) 20 MG tablet 3 tabs po day one, then 2 tabs daily x 4 days 08/24/20   Fayrene Helper, PA-C  pseudoephedrine (SUDAFED) 60 MG tablet Take 1 tablet (60 mg total) by mouth 2 (two) times daily as needed for congestion. 05/22/19   Wallis Bamberg, PA-C  sulfaSALAzine (AZULFIDINE) 500 MG tablet Take 2 tablets (1,000 mg total) by mouth 3 (three) times daily. 02/17/20 03/18/20  Rhys Martini, PA-C  cetirizine (ZYRTEC) 5 MG tablet Take 2 tablets (10 mg total) by mouth daily. 08/30/18 10/29/18  Aviva Kluver B, PA-C  ferrous sulfate 325 (65 FE) MG EC tablet Take 325 mg by mouth 3 (three) times daily with meals.  10/29/18  [provider]  fluticasone (FLONASE) 50 MCG/ACT nasal spray Place 1 spray into both nostrils daily as needed  for allergies or rhinitis. 08/30/18 01/06/19  Langston Masker B, PA-C  Fluticasone-Salmeterol (ADVAIR) 100-50 MCG/DOSE AEPB Inhale 1 puff into the lungs 2 (two) times daily. 09/06/17 12/25/18  Wieters, Hallie C, PA-C  montelukast (SINGULAIR) 10 MG tablet Take 1 tablet (10 mg total) by mouth at bedtime. 12/13/18 01/06/19  Melynda Ripple, MD    Family History Family History  Problem Relation Age of Onset   Healthy Mother    Healthy Father     Social History Social History   Tobacco Use   Smoking status: Former    Packs/day: 0.00    Types: Cigarettes   Smokeless  tobacco: Never  Vaping Use   Vaping Use: Never used  Substance Use Topics   Alcohol use: Not Currently   Drug use: Yes    Types: Cocaine, Marijuana    Comment: occasional cocaine use - last use few days ago; daily marijuana use     Allergies   Patient has no known allergies.   Review of Systems Review of Systems  Constitutional: Negative.   HENT: Negative.  Negative for congestion.   Respiratory: Negative.    Cardiovascular: Negative.   Gastrointestinal:  Positive for abdominal pain, constipation and diarrhea. Negative for abdominal distention, anal bleeding, blood in stool, nausea, rectal pain and vomiting.  Musculoskeletal: Negative.   Skin: Negative.      Physical Exam Triage Vital Signs ED Triage Vitals  Enc Vitals Group     BP 02/06/22 1120 123/86     Pulse Rate 02/06/22 1120 77     Resp 02/06/22 1120 12     Temp 02/06/22 1120 98 F (36.7 C)     Temp Source 02/06/22 1120 Oral     SpO2 02/06/22 1120 97 %     Weight --      Height --      Head Circumference --      Peak Flow --      Pain Score 02/06/22 1119 0     Pain Loc --      Pain Edu? --      Excl. in [place]? --    No data found.  Updated Vital Signs BP 123/86 (BP Location: Left Arm)   Pulse 77   Temp 98 F (36.7 C) (Oral)   Resp 12   SpO2 97%   Visual Acuity Right Eye Distance:   Left Eye Distance:   Bilateral Distance:    Right Eye Near:   Left Eye Near:    Bilateral Near:     Physical Exam Constitutional:      Appearance: He is well-developed.  Pulmonary:     Effort: Pulmonary effort is normal.  Abdominal:     General: Abdomen is flat. Bowel sounds are increased.     Palpations: Abdomen is soft.     Tenderness: There is abdominal tenderness.  Neurological:     Mental Status: He is alert.      UC Treatments / Results  Labs (all labs ordered are listed, but only abnormal results are displayed) Labs Reviewed - No data to display  EKG   Radiology No results  found.  Procedures Procedures (including critical care time)  Medications Ordered in UC Medications - No data to display  Initial Impression / Assessment and Plan / UC Course  I have reviewed the triage vital signs and the nursing notes.  Pertinent labs & imaging results that were available during my care of the patient were reviewed by me and considered in  my medical decision making (see chart for details).  Ulcerative colitis without complication  Vitals are stable, tenderness is generalized and bowel sounds are increased, based on symptomology and history we will prophylactically provide coverage for colitis, prescribed prednisone and dicyclomine recommended increase fluids for further management, advised that if symptoms worsen he is to go to the nearest emergency department for immediate evaluation and his symptoms persist he is to follow-up with GI for further evaluation and management, work note given Final Clinical Impressions(s) / UC Diagnoses   Final diagnoses:  None   Discharge Instructions   None    ED Prescriptions   None    PDMP not reviewed this encounter.   Hans Eden, NP 02/06/22 1143

## 2022-02-06 NOTE — Discharge Instructions (Signed)
Today you are being prophylactically treated for colitis based on history  Begin prednisone every morning as directed  Begin dicyclomine every morning and every evening for up to 10 days, this medicine helps to reduce stomach cramping  If your symptoms worsen please go to the nearest emergency department for immediate evaluation  If your symptoms continue to persist but do not worsen please follow-up with gastrointestinal for the specialist for further evaluation and management

## 2022-02-06 NOTE — ED Triage Notes (Signed)
Pt states he is possibly having a ulcerative colitis flare, diarrhea and constipation 2-3 wks

## 2022-12-01 ENCOUNTER — Other Ambulatory Visit: Payer: Self-pay

## 2022-12-01 ENCOUNTER — Emergency Department (HOSPITAL_COMMUNITY)
Admission: EM | Admit: 2022-12-01 | Discharge: 2022-12-01 | Discharge status: Against Medical Advice | Payer: Commercial Managed Care - HMO | Attending: Emergency Medicine | Admitting: Emergency Medicine

## 2022-12-01 ENCOUNTER — Encounter (HOSPITAL_COMMUNITY): Payer: Self-pay

## 2022-12-01 DIAGNOSIS — Z5321 Procedure and treatment not carried out due to patient leaving prior to being seen by health care provider: Secondary | ICD-10-CM | POA: Insufficient documentation

## 2022-12-01 DIAGNOSIS — R109 Unspecified abdominal pain: Secondary | ICD-10-CM | POA: Insufficient documentation

## 2022-12-01 LAB — COMPREHENSIVE METABOLIC PANEL
ALT: 11 U/L (ref 0–44)
AST: 15 U/L (ref 15–41)
Albumin: 3.7 g/dL (ref 3.5–5.0)
Alkaline Phosphatase: 147 U/L — ABNORMAL HIGH (ref 38–126)
Anion gap: 6 (ref 5–15)
BUN: 8 mg/dL (ref 6–20)
CO2: 27 mmol/L (ref 22–32)
Calcium: 8.9 mg/dL (ref 8.9–10.3)
Chloride: 105 mmol/L (ref 98–111)
Creatinine, Ser: 0.99 mg/dL (ref 0.61–1.24)
GFR, Estimated: >60 mL/min (ref >60)
Glucose, Bld: 100 mg/dL — ABNORMAL HIGH (ref 70–99)
Potassium: 3.7 mmol/L (ref 3.5–5.1)
Sodium: 138 mmol/L (ref 135–145)
Total Bilirubin: 0.7 mg/dL (ref 0.3–1.2)
Total Protein: 8.1 g/dL (ref 6.5–8.1)

## 2022-12-01 LAB — CBC
HCT: 38.5 % — ABNORMAL LOW (ref 39.0–52.0)
Hemoglobin: 12.1 g/dL — ABNORMAL LOW (ref 13.0–17.0)
MCH: 24.2 pg — ABNORMAL LOW (ref 26.0–34.0)
MCHC: 31.4 g/dL (ref 30.0–36.0)
MCV: 77 fL — ABNORMAL LOW (ref 80.0–100.0)
Platelets: 471 10*3/uL — ABNORMAL HIGH (ref 150–400)
RBC: 5 MIL/uL (ref 4.22–5.81)
RDW: 18.7 % — ABNORMAL HIGH (ref 11.5–15.5)
WBC: 6.3 10*3/uL (ref 4.0–10.5)
nRBC: 0.3 % — ABNORMAL HIGH (ref 0.0–0.2)

## 2022-12-01 LAB — LIPASE, BLOOD: Lipase: 106 U/L — ABNORMAL HIGH (ref 11–51)

## 2022-12-01 NOTE — ED Triage Notes (Signed)
Pt to ED by POV from home with c/o abdominal pain. Pt has a hx of ulcerative colitis and states this is consistent with previous flare ups. Pt endorses passing large amounts of blood and increasing pain over the past month. Arrives A+O, VSS, NADN.

## 2023-04-23 ENCOUNTER — Ambulatory Visit (HOSPITAL_COMMUNITY)
Admission: EM | Admit: 2023-04-23 | Discharge: 2023-04-23 | Disposition: A | Discharge status: Home / Self Care | Payer: Self-pay | Attending: Emergency Medicine | Admitting: Emergency Medicine

## 2023-04-23 ENCOUNTER — Other Ambulatory Visit: Payer: Self-pay

## 2023-04-23 ENCOUNTER — Encounter (HOSPITAL_COMMUNITY): Payer: Self-pay | Admitting: *Deleted

## 2023-04-23 DIAGNOSIS — Z760 Encounter for issue of repeat prescription: Secondary | ICD-10-CM

## 2023-04-23 DIAGNOSIS — K51919 Ulcerative colitis, unspecified with unspecified complications: Secondary | ICD-10-CM

## 2023-04-23 MED ORDER — DICYCLOMINE HCL 20 MG PO TABS: 20.0000 mg | ORAL_TABLET | Freq: Two times a day (BID) | ORAL | 0 refills | Status: DC | PRN

## 2023-04-23 MED ORDER — PREDNISONE 10 MG (21) PO TBPK: ORAL_TABLET | Freq: Every day | ORAL | 0 refills | Status: DC

## 2023-04-23 MED ORDER — PREDNISONE 20 MG PO TABS
ORAL_TABLET | ORAL | Status: AC
  Filled 2023-04-23: qty 2

## 2023-04-23 MED ORDER — PREDNISONE 20 MG PO TABS
40.0000 mg | ORAL_TABLET | Freq: Once | ORAL | Status: AC
  Administered 2023-04-23: 40 mg via ORAL

## 2023-04-23 MED ORDER — ALBUTEROL SULFATE HFA 108 (90 BASE) MCG/ACT IN AERS
INHALATION_SPRAY | RESPIRATORY_TRACT | Status: AC
  Filled 2023-04-23: qty 6.7

## 2023-04-23 MED ORDER — ALBUTEROL SULFATE HFA 108 (90 BASE) MCG/ACT IN AERS
1.0000 | INHALATION_SPRAY | Freq: Once | RESPIRATORY_TRACT | Status: AC
  Administered 2023-04-23: 2 via RESPIRATORY_TRACT

## 2023-04-23 NOTE — ED Triage Notes (Signed)
 PT reports bloody stools and 10/10 ABD pain due to Ulcertive colitis

## 2023-04-23 NOTE — ED Provider Notes (Signed)
 [facility]    CSN: [id number] Arrival date & time: 04/23/23  1617      History   Chief Complaint Chief Complaint  Patient presents with   Rectal Bleeding   Abdominal Pain    HPI Jerome Irwin is a 43 y.o. male.   Patient presents to clinic for severe abdominal pain and stools with bloody mucus, reports he is having a UC flareup.  Has a history of ulcerative colitis, has been taking sulfasalazine, per patient, and recently completed a prednisone taper but symptoms remain.  Has not had any fevers.  Without nausea or vomiting.  Does not have a primary care provider or specialist.  Feels the constant urge to need to have bowel movements.  When he goes he has small bowel movements with some mucus and some blood in the mucus.  Would also like a refill of his albuterol inhaler.  He uses this intermittently, last use was today when he ran out.  Denies any current wheezing or shortness of breath.  Will occasionally wheeze with the pollen and season changes.  The history is provided by the patient and medical records.  Rectal Bleeding Abdominal Pain Associated symptoms: hematochezia     Past Medical History:  Diagnosis Date   Asthma    Bronchitis    Pneumonia    Ulcerative colitis (HCC)     There are no active problems to display for this patient.   Past Surgical History:  Procedure Laterality Date   COLONOSCOPY WITH PROPOFOL N/A 02/21/2016   Procedure: COLONOSCOPY WITH PROPOFOL;  Surgeon: Willis Modena, MD;  Location: [facility];  Service: Endoscopy;  Laterality: N/A;       Home Medications    Prior to Admission medications   Medication Sig Start Date End Date Taking? Authorizing Provider  dicyclomine (BENTYL) 20 MG tablet Take 1 tablet (20 mg total) by mouth 2 (two) times daily as needed for spasms. 04/23/23  Yes Rinaldo Ratel, [place], FNP  predniSONE (STERAPRED UNI-PAK 21 TAB) 10 MG (21) TBPK tablet Take by mouth daily. Take as prescribed 04/23/23  Yes  Rinaldo Ratel, [place] N, FNP  albuterol (VENTOLIN HFA) 108 (90 Base) MCG/ACT inhaler Inhale 2 puffs into the lungs every 4 (four) hours as needed for wheezing or shortness of breath. 08/12/21   Prosperi, Christian H, PA-C  Budesonide 90 MCG/ACT inhaler Inhale 2 puffs into the lungs 2 (two) times daily. 10/15/19   Georgetta Haber, NP  ipratropium-albuterol (DUONEB) 0.5-2.5 (3) MG/3ML SOLN SMARTSIG:1 Vial(s) Via Nebulizer 4 Times Daily PRN 01/26/19   [provider]  levocetirizine (XYZAL) 5 MG tablet Take 1 tablet (5 mg total) by mouth every evening. 05/07/20   Particia Nearing, PA-C  methocarbamol (ROBAXIN) 500 MG tablet Take 1 tablet (500 mg total) by mouth every 8 (eight) hours as needed for muscle spasms. 12/08/21   Gloris Manchester, MD  omeprazole (PRILOSEC) 20 MG capsule Take 1 capsule (20 mg total) by mouth daily. 11/27/18   Eustace Moore, MD  pseudoephedrine (SUDAFED) 60 MG tablet Take 1 tablet (60 mg total) by mouth 2 (two) times daily as needed for congestion. 05/22/19   Wallis Bamberg, PA-C  sulfaSALAzine (AZULFIDINE) 500 MG tablet Take 2 tablets (1,000 mg total) by mouth 3 (three) times daily. 02/17/20 03/18/20  Rhys Martini, PA-C  cetirizine (ZYRTEC) 5 MG tablet Take 2 tablets (10 mg total) by mouth daily. 08/30/18 10/29/18  Aviva Kluver B, PA-C  ferrous sulfate 325 (65 FE) MG EC tablet Take  325 mg by mouth 3 (three) times daily with meals.  10/29/18  [provider]  fluticasone (FLONASE) 50 MCG/ACT nasal spray Place 1 spray into both nostrils daily as needed for allergies or rhinitis. 08/30/18 01/06/19  Aviva Kluver B, PA-C  Fluticasone-Salmeterol (ADVAIR) 100-50 MCG/DOSE AEPB Inhale 1 puff into the lungs 2 (two) times daily. 09/06/17 12/25/18  Wieters, Hallie C, PA-C  montelukast (SINGULAIR) 10 MG tablet Take 1 tablet (10 mg total) by mouth at bedtime. 12/13/18 01/06/19  Domenick Gong, MD    Family History Family History  Problem Relation Age of Onset   Healthy Mother     Healthy Father     Social History Social History   Tobacco Use   Smoking status: Former    Types: Cigarettes   Smokeless tobacco: Never  Vaping Use   Vaping status: Never Used  Substance Use Topics   Alcohol use: Not Currently   Drug use: Yes    Types: Cocaine, Marijuana    Comment: occasional cocaine use - last use few days ago; daily marijuana use     Allergies   Patient has no known allergies.   Review of Systems Review of Systems  Gastrointestinal:  Positive for hematochezia.   Per HPI   Physical Exam Triage Vital Signs ED Triage Vitals  Encounter Vitals Group     BP 04/23/23 1752 (!) 154/99     Systolic BP Percentile --      Diastolic BP Percentile --      Pulse Rate 04/23/23 1752 70     Resp 04/23/23 1752 20     Temp --      Temp src --      SpO2 04/23/23 1752 95 %     Weight --      Height --      Head Circumference --      Peak Flow --      Pain Score 04/23/23 1750 10     Pain Loc --      Pain Education --      Exclude from Growth Chart --    No data found.  Updated Vital Signs BP (!) 154/99   Pulse 70   Resp 20   SpO2 95%   Visual Acuity Right Eye Distance:   Left Eye Distance:   Bilateral Distance:    Right Eye Near:   Left Eye Near:    Bilateral Near:     Physical Exam Vitals and nursing note reviewed.  Constitutional:      Appearance: He is well-developed.  HENT:     Head: Normocephalic and atraumatic.     Mouth/Throat:     Mouth: Mucous membranes are moist.  Cardiovascular:     Rate and Rhythm: Normal rate and regular rhythm.  Pulmonary:     Effort: Pulmonary effort is normal. No respiratory distress.  Abdominal:     General: Abdomen is flat. Bowel sounds are normal.     Palpations: Abdomen is soft.     Tenderness: There is generalized abdominal tenderness. There is no guarding or rebound.     Hernia: No hernia is present.  Skin:    General: Skin is warm and dry.  Neurological:     General: No focal deficit  present.     Mental Status: He is alert.  Psychiatric:        Mood and Affect: Mood normal.      UC Treatments / Results  Labs (all labs ordered are listed,  but only abnormal results are displayed) Labs Reviewed - No data to display  EKG   Radiology No results found.  Procedures Procedures (including critical care time)  Medications Ordered in UC Medications  predniSONE (DELTASONE) tablet 40 mg (has no administration in time range)  albuterol (VENTOLIN HFA) 108 (90 Base) MCG/ACT inhaler 1-2 puff (has no administration in time range)    Initial Impression / Assessment and Plan / UC Course  I have reviewed the triage vital signs and the nursing notes.  Pertinent labs & imaging results that were available during my care of the patient were reviewed by me and considered in my medical decision making (see chart for details).  Vitals and triage reviewed, patient is hemodynamically stable.  Abdomen is soft with active bowel sounds.  Patient endorses diffuse tenderness to palpation, without rebound, guarding or grimacing.  No palpable hernia.  Overall physical exam is reassuring, low concern for surgical abdomen at this time.  Without tachycardia and afebrile.  Will trial Bentyl and steroid taper, encouraged specialty follow-up for ongoing management of chronic disease.  Without any wheezing or shortness of breath, will refill albuterol inhaler.   Strict emergency precautions given if symptoms evolve or do not improve.  Information on how to apply for Medicaid discussed.  Plan of care, follow-up care and return precautions given, no questions at this time.     Final Clinical Impressions(s) / UC Diagnoses   Final diagnoses:  Medication refill  Ulcerative colitis with complication, unspecified location [facility])     Discharge Instructions      Take the Bentyl as needed for spasms.  Start the prednisone taper pack.  We have given you albuterol inhaler today in clinic.  As  Urgent Care providers, we only only can evaluate you for an episodic event; and this cannot be substituted for the continued care and monitoring by your primary care provider and/or specialist. It is not unusual that a medical condition can present itself in one way, then progress or change and lead to another impression of your medical condition. If you should have any new or worsening symptoms, please go to the closest emergency department and contact your primary physician as soon as possible for further evaluation and testing.   Seek immediate care for any continued severe abdominal pain, loss of consciousness, syncope, or any new concerning symptoms at the nearest emergency department.  It is essential that you follow-up with a specialist for further management of your chronic disease.  Consider following up with a family medicine doctor for further management of your chronic disease, asthma.     ED Prescriptions     Medication Sig Dispense Auth. Provider   dicyclomine (BENTYL) 20 MG tablet Take 1 tablet (20 mg total) by mouth 2 (two) times daily as needed for spasms. 20 tablet Rinaldo Ratel, [place] N, [place]   predniSONE (STERAPRED UNI-PAK 21 TAB) 10 MG (21) TBPK tablet Take by mouth daily. Take as prescribed 21 tablet Casidy Alberta, [place] N, [place]      PDMP not reviewed this encounter.   Hazely Sealey, [place] N, [place] 04/23/23 1820

## 2023-04-23 NOTE — Discharge Instructions (Addendum)
 Take the Bentyl as needed for spasms.  Start the prednisone taper pack.  We have given you albuterol inhaler today in clinic.  As Urgent Care providers, we only only can evaluate you for an episodic event; and this cannot be substituted for the continued care and monitoring by your primary care provider and/or specialist. It is not unusual that a medical condition can present itself in one way, then progress or change and lead to another impression of your medical condition. If you should have any new or worsening symptoms, please go to the closest emergency department and contact your primary physician as soon as possible for further evaluation and testing.   Seek immediate care for any continued severe abdominal pain, loss of consciousness, syncope, or any new concerning symptoms at the nearest emergency department.  It is essential that you follow-up with a specialist for further management of your chronic disease.  Consider following up with a family medicine doctor for further management of your chronic disease, asthma.

## 2023-11-26 ENCOUNTER — Ambulatory Visit (HOSPITAL_COMMUNITY)
Admission: EM | Admit: 2023-11-26 | Discharge: 2023-11-26 | Disposition: A | Discharge status: Home / Self Care | Payer: Self-pay | Attending: Nurse Practitioner | Admitting: Nurse Practitioner

## 2023-11-26 ENCOUNTER — Encounter (HOSPITAL_COMMUNITY): Payer: Self-pay

## 2023-11-26 DIAGNOSIS — K51919 Ulcerative colitis, unspecified with unspecified complications: Secondary | ICD-10-CM

## 2023-11-26 DIAGNOSIS — R197 Diarrhea, unspecified: Secondary | ICD-10-CM

## 2023-11-26 MED ORDER — LOPERAMIDE HCL 2 MG PO CAPS: 2.0000 mg | ORAL_CAPSULE | Freq: Four times a day (QID) | ORAL | 0 refills | Status: DC | PRN

## 2023-11-26 MED ORDER — DICYCLOMINE HCL 20 MG PO TABS: 20.0000 mg | ORAL_TABLET | Freq: Four times a day (QID) | ORAL | 0 refills | Status: DC | PRN

## 2023-11-26 MED ORDER — SULFASALAZINE 500 MG PO TABS: 1000.0000 mg | ORAL_TABLET | Freq: Three times a day (TID) | ORAL | 0 refills | Status: AC

## 2023-11-26 MED ORDER — PREDNISONE 10 MG (21) PO TBPK: ORAL_TABLET | Freq: Every day | ORAL | 0 refills | Status: DC

## 2023-11-26 NOTE — ED Triage Notes (Signed)
 Patient states he has a history of Ulcerative colitis. Patient states he is out of medication and having bloody, mucus stools.

## 2023-11-26 NOTE — Discharge Instructions (Addendum)
 You were seen today for a flare-up of your ulcerative colitis after running out of your medication. Your exam and vital signs were stable, and there are no signs of severe illness at this time. This flare is likely related to stopping your regular sulfasalazine , which helps keep your symptoms under control. You were given a new prescription for sulfasalazine  to restart, along with a prednisone  taper to reduce inflammation and help your symptoms improve. You were also prescribed Bentyl  and Imodium to use as needed for cramping and diarrhea.  Try to eat small, frequent meals that are gentle on your stomach. Include soft, easy-to-digest foods such as rice, oatmeal, bananas, and applesauce, and gradually increase fiber as tolerated. Avoid foods that are spicy, greasy, or high in fat, as well as caffeine and alcohol , which can make symptoms worse. Drink plenty of fluids throughout the day to prevent dehydration, especially if you are having loose stools.  Follow up with your regular ulcerative colitis provider, Ronal Houseman, NP, or your gastroenterologist as soon as possible, ideally within the next week, to review your medications and update your treatment plan. Continue taking all medications exactly as prescribed and do not stop the prednisone  suddenly. Go to the emergency department right away if you develop severe or worsening abdominal pain, persistent vomiting, a large amount of blood in your stool, dizziness, weakness, high fever, or signs of dehydration such as dry mouth, little or no urine, or confusion.

## 2023-11-26 NOTE — ED Provider Notes (Signed)
 [facility]    CSN: [id number] Arrival date & time: 11/26/23  1149      History   Chief Complaint Chief Complaint  Patient presents with   Diarrhea    HPI Jerome Irwin is a 43 y.o. male.   Discussed the use of AI scribe software for clinical note transcription with the patient, who gave verbal consent to proceed.   The patient has a known history of ulcerative colitis and presents with bloody, mucous stools following a lapse in medication use. He typically takes sulfasalazine  but reports misplacing the remaining supply and has not been taking it as prescribed. Since running out, he has noticed intermittent rectal bleeding-sometimes bright red blood on toilet paper, other times mixed with stool and mucus. He also reports mild, intermittent abdominal cramping but denies severe pain.  The patient states he has been eating and drinking normally and has not experienced fever, nausea, vomiting, or significant abdominal discomfort. He notes that he used suppositories in the past but discontinued them due to cost. He acknowledges needing more fiber in his diet. The patient is currently between jobs and uninsured but says his family can assist with medication expenses. He typically follows with Ronal Houseman, NP, for ulcerative colitis management but has not had a recent appointment and plans to contact her office next week. He presents today requesting steroid treatment for a suspected UC flare.  The following sections of the patient's history were reviewed and updated as appropriate: allergies, current medications, past family history, past medical history, past social history, past surgical history, and problem list.     Past Medical History:  Diagnosis Date   Asthma    Bronchitis    Pneumonia    Ulcerative colitis (HCC)     There are no active problems to display for this patient.   Past Surgical History:  Procedure Laterality Date   COLONOSCOPY WITH PROPOFOL  N/A  02/21/2016   Procedure: COLONOSCOPY WITH PROPOFOL ;  Surgeon: Elsie Cree, MD;  Location: [facility];  Service: Endoscopy;  Laterality: N/A;       Home Medications    Prior to Admission medications   Medication Sig Start Date End Date Taking? Authorizing Provider  dicyclomine  (BENTYL ) 20 MG tablet Take 1 tablet (20 mg total) by mouth 4 (four) times daily as needed (intestinal cramps). 11/26/23  Yes Breane Grunwald, FNP  loperamide (IMODIUM) 2 MG capsule Take 1 capsule (2 mg total) by mouth 4 (four) times daily as needed for diarrhea or loose stools. 11/26/23  Yes Micajah Dennin, Lucie, FNP  predniSONE  (STERAPRED UNI-PAK 21 TAB) 10 MG (21) TBPK tablet Take by mouth daily. Take 6 tabs by mouth daily  for 2 days, then 5 tabs for 2 days, then 4 tabs for 2 days, then 3 tabs for 2 days, 2 tabs for 2 days, then 1 tab by mouth daily for 2 days 11/26/23  Yes Iola Lucie, FNP  sulfaSALAzine  (AZULFIDINE ) 500 MG tablet Take 2 tablets (1,000 mg total) by mouth 3 (three) times daily. 11/26/23 12/26/23  Iola Lucie, FNP  cetirizine  (ZYRTEC ) 5 MG tablet Take 2 tablets (10 mg total) by mouth daily. 08/30/18 10/29/18  Murray, Alyssa B, PA-C  ferrous sulfate 325 (65 FE) MG EC tablet Take 325 mg by mouth 3 (three) times daily with meals.  10/29/18  [provider]  fluticasone  (FLONASE ) 50 MCG/ACT nasal spray Place 1 spray into both nostrils daily as needed for allergies or rhinitis. 08/30/18 01/06/19  Jason Fish B, PA-C  Fluticasone -Salmeterol (  ADVAIR) 100-50 MCG/DOSE AEPB Inhale 1 puff into the lungs 2 (two) times daily. 09/06/17 12/25/18  Wieters, Hallie C, PA-C  montelukast  (SINGULAIR ) 10 MG tablet Take 1 tablet (10 mg total) by mouth at bedtime. 12/13/18 01/06/19  Van Knee, MD    Family History Family History  Problem Relation Age of Onset   Healthy Mother    Healthy Father     Social History Social History   Tobacco Use   Smoking status: Former    Types: Cigarettes    Smokeless tobacco: Never  Vaping Use   Vaping status: Never Used  Substance Use Topics   Alcohol  use: Not Currently   Drug use: Yes    Types: Cocaine, Marijuana    Comment: occasional cocaine use - last use few days ago; daily marijuana use     Allergies   Patient has no known allergies.   Review of Systems Review of Systems  Constitutional:  Negative for appetite change and fever.  Gastrointestinal:  Positive for abdominal pain, anal bleeding, blood in stool and diarrhea. Negative for abdominal distention, constipation, nausea, rectal pain and vomiting.  Genitourinary: Negative.   All other systems reviewed and are negative.    Physical Exam Triage Vital Signs ED Triage Vitals  Encounter Vitals Group     BP 11/26/23 1324 (!) 136/92     Girls Systolic BP Percentile --      Girls Diastolic BP Percentile --      Boys Systolic BP Percentile --      Boys Diastolic BP Percentile --      Pulse Rate 11/26/23 1324 77     Resp 11/26/23 1324 18     Temp 11/26/23 1324 98.2 F (36.8 C)     Temp Source 11/26/23 1324 Oral     SpO2 11/26/23 1324 93 %     Weight --      Height --      Head Circumference --      Peak Flow --      Pain Score 11/26/23 1329 0     Pain Loc --      Pain Education --      Exclude from Growth Chart --    No data found.  Updated Vital Signs BP (!) 136/92 (BP Location: Left Arm)   Pulse 77   Temp 98.2 F (36.8 C) (Oral)   Resp 18   SpO2 93%   Visual Acuity Right Eye Distance:   Left Eye Distance:   Bilateral Distance:    Right Eye Near:   Left Eye Near:    Bilateral Near:     Physical Exam Vitals reviewed.  Constitutional:      General: He is awake. He is not in acute distress.    Appearance: Normal appearance. He is well-developed. He is not ill-appearing, toxic-appearing or diaphoretic.  HENT:     Head: Normocephalic.     Right Ear: Hearing normal.     Left Ear: Hearing normal.     Nose: Nose normal.     Mouth/Throat:     Mouth:  Mucous membranes are moist.  Eyes:     General: Vision grossly intact.     Conjunctiva/sclera: Conjunctivae normal.  Cardiovascular:     Rate and Rhythm: Normal rate and regular rhythm.     Heart sounds: Normal heart sounds.  Pulmonary:     Effort: Pulmonary effort is normal.     Breath sounds: Normal breath sounds and air entry.  Abdominal:  General: Bowel sounds are normal. There is no distension.     Palpations: Abdomen is soft.     Tenderness: There is no abdominal tenderness.  Musculoskeletal:        General: Normal range of motion.     Cervical back: Normal range of motion and neck supple.  Skin:    General: Skin is warm and dry.  Neurological:     General: No focal deficit present.     Mental Status: He is alert and oriented to person, place, and time.  Psychiatric:        Speech: Speech normal.        Behavior: Behavior is cooperative.      UC Treatments / Results  Labs (all labs ordered are listed, but only abnormal results are displayed) Labs Reviewed - No data to display  EKG   Radiology No results found.  Procedures Procedures (including critical care time)  Medications Ordered in UC Medications - No data to display  Initial Impression / Assessment and Plan / UC Course  I have reviewed the triage vital signs and the nursing notes.  Pertinent labs & imaging results that were available during my care of the patient were reviewed by me and considered in my medical decision making (see chart for details).     The patient presents with bloody, mucous stools and mild abdominal cramping after running out of his sulfasalazine , which he uses for ulcerative colitis. He is alert, oriented, afebrile, and nontoxic. Physical examination is unremarkable, with no signs of acute distress, severe pain, or dehydration. Symptoms and history are consistent with an acute ulcerative colitis flare triggered by medication lapse.  The patient will be treated with a  prednisone  taper for flare management. Bentyl  and Imodium were prescribed for symptom relief as needed, and his maintenance sulfasalazine  was reordered with a 30-day supply to bridge him until he can follow up with his gastroenterology provider. Supportive measures were discussed, including eating small, frequent meals, increasing dietary fiber gradually, and maintaining adequate hydration.  The patient was advised to follow up with his established provider within one week or sooner if symptoms worsen. Emergency precautions were reviewed, including seeking immediate care for severe abdominal pain, persistent bleeding, dehydration, or fever.  Today's evaluation has revealed no signs of a dangerous process. Discussed diagnosis with patient and/or guardian. Patient and/or guardian aware of their diagnosis, possible red flag symptoms to watch out for and need for close follow up. Patient and/or guardian understands verbal and written discharge instructions. Patient and/or guardian comfortable with plan and disposition.  Patient and/or guardian has a clear mental status at this time, good insight into illness (after discussion and teaching) and has clear judgment to make decisions regarding their care  Documentation was completed with the aid of voice recognition software. Transcription may contain typographical errors.  Final Clinical Impressions(s) / UC Diagnoses   Final diagnoses:  Diarrhea, unspecified type  Exacerbation of ulcerative colitis with complication [facility])     Discharge Instructions      You were seen today for a flare-up of your ulcerative colitis after running out of your medication. Your exam and vital signs were stable, and there are no signs of severe illness at this time. This flare is likely related to stopping your regular sulfasalazine , which helps keep your symptoms under control. You were given a new prescription for sulfasalazine  to restart, along with a prednisone  taper to  reduce inflammation and help your symptoms improve. You were also prescribed  Bentyl  and Imodium to use as needed for cramping and diarrhea.  Try to eat small, frequent meals that are gentle on your stomach. Include soft, easy-to-digest foods such as rice, oatmeal, bananas, and applesauce, and gradually increase fiber as tolerated. Avoid foods that are spicy, greasy, or high in fat, as well as caffeine and alcohol , which can make symptoms worse. Drink plenty of fluids throughout the day to prevent dehydration, especially if you are having loose stools.  Follow up with your regular ulcerative colitis provider, Ronal Houseman, NP, or your gastroenterologist as soon as possible, ideally within the next week, to review your medications and update your treatment plan. Continue taking all medications exactly as prescribed and do not stop the prednisone  suddenly. Go to the emergency department right away if you develop severe or worsening abdominal pain, persistent vomiting, a large amount of blood in your stool, dizziness, weakness, high fever, or signs of dehydration such as dry mouth, little or no urine, or confusion.      ED Prescriptions     Medication Sig Dispense Auth. Provider   sulfaSALAzine  (AZULFIDINE ) 500 MG tablet Take 2 tablets (1,000 mg total) by mouth 3 (three) times daily. 180 tablet Iola Lukes, FNP   loperamide (IMODIUM) 2 MG capsule Take 1 capsule (2 mg total) by mouth 4 (four) times daily as needed for diarrhea or loose stools. 12 capsule Iola Lukes, FNP   predniSONE  (STERAPRED UNI-PAK 21 TAB) 10 MG (21) TBPK tablet Take by mouth daily. Take 6 tabs by mouth daily  for 2 days, then 5 tabs for 2 days, then 4 tabs for 2 days, then 3 tabs for 2 days, 2 tabs for 2 days, then 1 tab by mouth daily for 2 days 42 tablet Marquell Saenz, [place], FNP   dicyclomine  (BENTYL ) 20 MG tablet Take 1 tablet (20 mg total) by mouth 4 (four) times daily as needed (intestinal cramps). 20 tablet Iola Lukes, FNP      PDMP not reviewed this encounter.   Iola Lukes, [place] 11/26/23 [phone]

## 2023-11-27 ENCOUNTER — Telehealth: Payer: Self-pay | Admitting: *Deleted

## 2023-11-27 NOTE — Telephone Encounter (Signed)
 Pts rxs are ready for pick up pt aware

## 2024-01-26 ENCOUNTER — Other Ambulatory Visit (HOSPITAL_COMMUNITY)
Admission: EM | Admit: 2024-01-26 | Discharge: 2024-01-29 | Disposition: A | Source: Home / Self Care | Admitting: Family

## 2024-01-26 ENCOUNTER — Other Ambulatory Visit (HOSPITAL_COMMUNITY)
Admission: EM | Admit: 2024-01-26 | Discharge: 2024-01-26 | Disposition: A | Discharge status: Other Acute Inpatient Hospital | Attending: Psychiatry | Admitting: Psychiatry

## 2024-01-26 DIAGNOSIS — F1510 Other stimulant abuse, uncomplicated: Secondary | ICD-10-CM | POA: Insufficient documentation

## 2024-01-26 DIAGNOSIS — Z87891 Personal history of nicotine dependence: Secondary | ICD-10-CM | POA: Insufficient documentation

## 2024-01-26 DIAGNOSIS — F1290 Cannabis use, unspecified, uncomplicated: Secondary | ICD-10-CM | POA: Insufficient documentation

## 2024-01-26 DIAGNOSIS — F1410 Cocaine abuse, uncomplicated: Secondary | ICD-10-CM | POA: Diagnosis not present

## 2024-01-26 DIAGNOSIS — F1590 Other stimulant use, unspecified, uncomplicated: Secondary | ICD-10-CM

## 2024-01-26 LAB — COMPREHENSIVE METABOLIC PANEL WITH GFR
ALT: 12 U/L (ref 0–44)
AST: 21 U/L (ref 15–41)
Albumin: 4.4 g/dL (ref 3.5–5.0)
Alkaline Phosphatase: 98 U/L (ref 38–126)
Anion gap: 12 (ref 5–15)
BUN: 9 mg/dL (ref 6–20)
CO2: 24 mmol/L (ref 22–32)
Calcium: 9.7 mg/dL (ref 8.9–10.3)
Chloride: 104 mmol/L (ref 98–111)
Creatinine, Ser: 1.01 mg/dL (ref 0.61–1.24)
GFR, Estimated: >60 mL/min
Glucose, Bld: 103 mg/dL — ABNORMAL HIGH (ref 70–99)
Potassium: 3.6 mmol/L (ref 3.5–5.1)
Sodium: 140 mmol/L (ref 135–145)
Total Bilirubin: 0.3 mg/dL (ref 0.0–1.2)
Total Protein: 7.2 g/dL (ref 6.5–8.1)

## 2024-01-26 LAB — CBC WITH DIFFERENTIAL/PLATELET
Abs Immature Granulocytes: 0.01 K/uL (ref 0.00–0.07)
Basophils Absolute: 0.1 K/uL (ref 0.0–0.1)
Basophils Relative: 1 %
Eosinophils Absolute: 0.3 K/uL (ref 0.0–0.5)
Eosinophils Relative: 6 %
HCT: 39.7 % (ref 39.0–52.0)
Hemoglobin: 12.6 g/dL — ABNORMAL LOW (ref 13.0–17.0)
Immature Granulocytes: 0 %
Lymphocytes Relative: 47 %
Lymphs Abs: 2.1 K/uL (ref 0.7–4.0)
MCH: 26.5 pg (ref 26.0–34.0)
MCHC: 31.7 g/dL (ref 30.0–36.0)
MCV: 83.6 fL (ref 80.0–100.0)
Monocytes Absolute: 0.4 K/uL (ref 0.1–1.0)
Monocytes Relative: 9 %
Neutro Abs: 1.7 K/uL (ref 1.7–7.7)
Neutrophils Relative %: 37 %
Platelets: 326 K/uL (ref 150–400)
RBC: 4.75 MIL/uL (ref 4.22–5.81)
RDW: 17 % — ABNORMAL HIGH (ref 11.5–15.5)
WBC: 4.5 K/uL (ref 4.0–10.5)
nRBC: 0 % (ref 0.0–0.2)

## 2024-01-26 LAB — MAGNESIUM: Magnesium: 2 mg/dL (ref 1.7–2.4)

## 2024-01-26 LAB — ETHANOL: Alcohol, Ethyl (B): <15 mg/dL

## 2024-01-26 LAB — POCT URINE DRUG SCREEN - MANUAL ENTRY (I-SCREEN)
POC Amphetamine UR: NOT DETECTED
POC Buprenorphine (BUP): NOT DETECTED
POC Cocaine UR: NOT DETECTED — AB
POC Marijuana UR: POSITIVE — AB
POC Methadone UR: NOT DETECTED
POC Methamphetamine UR: NOT DETECTED
POC Morphine: NOT DETECTED
POC Oxazepam (BZO): NOT DETECTED
POC Oxycodone UR: NOT DETECTED
POC Secobarbital (BAR): NOT DETECTED

## 2024-01-26 LAB — TSH: TSH: 1.89 u[IU]/mL (ref 0.350–4.500)

## 2024-01-26 MED ORDER — MAGNESIUM HYDROXIDE 400 MG/5ML PO SUSP: 30.0000 mL | Freq: Every day | ORAL | Status: DC | PRN

## 2024-01-26 MED ORDER — HALOPERIDOL 5 MG PO TABS: 5.0000 mg | ORAL_TABLET | Freq: Three times a day (TID) | ORAL | Status: DC | PRN

## 2024-01-26 MED ORDER — ALUM & MAG HYDROXIDE-SIMETH 200-200-20 MG/5ML PO SUSP: 30.0000 mL | ORAL | Status: DC | PRN

## 2024-01-26 MED ORDER — DIPHENHYDRAMINE HCL 50 MG/ML IJ SOLN: 50.0000 mg | Freq: Three times a day (TID) | INTRAMUSCULAR | Status: DC | PRN

## 2024-01-26 MED ORDER — ACETAMINOPHEN 325 MG PO TABS: 650.0000 mg | ORAL_TABLET | Freq: Four times a day (QID) | ORAL | Status: DC | PRN

## 2024-01-26 MED ORDER — DIPHENHYDRAMINE HCL 50 MG PO CAPS: 50.0000 mg | ORAL_CAPSULE | Freq: Three times a day (TID) | ORAL | Status: DC | PRN

## 2024-01-26 MED ORDER — LOPERAMIDE HCL 2 MG PO CAPS
2.0000 mg | ORAL_CAPSULE | Freq: Four times a day (QID) | ORAL | Status: DC | PRN
  Administered 2024-01-28 (×2): 2 mg via ORAL
  Filled 2024-01-26 (×2): qty 1

## 2024-01-26 MED ORDER — TRAZODONE HCL 50 MG PO TABS
50.0000 mg | ORAL_TABLET | Freq: Every evening | ORAL | Status: DC | PRN
  Administered 2024-01-27 – 2024-01-28 (×2): 50 mg via ORAL
  Filled 2024-01-26 (×2): qty 1

## 2024-01-26 MED ORDER — SULFASALAZINE 500 MG PO TABS
1000.0000 mg | ORAL_TABLET | Freq: Three times a day (TID) | ORAL | Status: DC
  Filled 2024-01-26: qty 2

## 2024-01-26 MED ORDER — HALOPERIDOL LACTATE 5 MG/ML IJ SOLN: 5.0000 mg | Freq: Three times a day (TID) | INTRAMUSCULAR | Status: DC | PRN

## 2024-01-26 MED ORDER — LORAZEPAM 2 MG/ML IJ SOLN: 2.0000 mg | Freq: Three times a day (TID) | INTRAMUSCULAR | Status: DC | PRN

## 2024-01-26 MED ORDER — FOLIC ACID 1 MG PO TABS: 1.0000 mg | ORAL_TABLET | Freq: Every day | ORAL | Status: DC

## 2024-01-26 MED ORDER — TRAZODONE HCL 50 MG PO TABS: 50.0000 mg | ORAL_TABLET | Freq: Every evening | ORAL | Status: DC | PRN

## 2024-01-26 MED ORDER — SULFASALAZINE 500 MG PO TABS
1000.0000 mg | ORAL_TABLET | Freq: Three times a day (TID) | ORAL | Status: DC
  Administered 2024-01-27 – 2024-01-29 (×7): 1000 mg via ORAL
  Filled 2024-01-26 (×10): qty 2

## 2024-01-26 MED ORDER — DICYCLOMINE HCL 20 MG PO TABS: 20.0000 mg | ORAL_TABLET | Freq: Four times a day (QID) | ORAL | Status: DC | PRN

## 2024-01-26 MED ORDER — HALOPERIDOL LACTATE 5 MG/ML IJ SOLN: 10.0000 mg | Freq: Three times a day (TID) | INTRAMUSCULAR | Status: DC | PRN

## 2024-01-26 MED ORDER — L-METHYLFOLATE-B6-B12 3-35-2 MG PO TABS
1.0000 | ORAL_TABLET | Freq: Every day | ORAL | Status: DC
  Filled 2024-01-26: qty 1

## 2024-01-26 NOTE — Progress Notes (Signed)
" °   01/26/24 1555  [facility] Triage Screening (Walk-ins at [facility] only)  How Did You Hear About Us ? Self  What Is the Reason for Your Visit/Call Today? Jerome Irwin is a 43 year old male accompanied by his mother presenting to [facility] for detox from cocaine. Pt stated he has been raised in [place]  and was in a tough environment and grew up without a father so he had to mature fast and feels this and being in and out of prison have caused depression in him. Pt stated he is not currently experiencing any symptoms other than agitation and moodiness. Pt denied SI/HI/AVH/SIB. Pt has a history of substance abuse since he was around 5 or 43 yo when he began using THC. PT stated he began to use cocaine at age 66 and has been using ewver since with around 3 years sober only when he was in Prison. Pt stated his last released from prison was in 2022 and he has been using cocaine almost daily since then. Pt does endorse using black and mild cigars 1-2x a week but denied use of any other substances. Pt stated he has used cocaine and thc within the last 24 hrs. he stated he may have used cocaine either yesterday or the day before via snorting about .2 grams and THC this morning no more than 2-3 grams. Pt would like detox in preparation for rehab facility.  How Long Has This Been Causing You Problems? > than 6 months  Have You Recently Had Any Thoughts About Hurting Yourself? No  Are You Planning to Commit Suicide/Harm Yourself At This time? No  Have you Recently Had Thoughts About Hurting Someone Sherral? No  Are You Planning To Harm Someone At This Time? No  Physical Abuse Denies  Verbal Abuse Denies  Sexual Abuse Denies  Exploitation of patient/patient's resources Denies  Self-Neglect Denies  Possible abuse reported to:  (n/a)  Are you currently experiencing any auditory, visual or other hallucinations? No  Have You Used Any Alcohol  or Drugs in the Past 24 Hours? Yes  What Did You Use and How Much? THC&Cocaine/ 1-2  grams/.2 grams  Do you have any current medical co-morbidities that require immediate attention? Yes  Please describe current medical co-morbidities that require immediate attention: Uclerative collitis. asthma  Clinician description of patient physical appearance/behavior: normal dress, wearing sunglasses, normal posture, normal speech and tone, behavior unremarkable.  What Do You Feel Would Help You the Most Today? Alcohol  or Drug Use Treatment  If access to [facility] was not available, would you have sought care in the Emergency Department? No  Determination of Need Routine (7 days)  Options For Referral Facility-Based Crisis    "

## 2024-01-26 NOTE — ED Notes (Signed)
 RN spoke with patient A&Ox4. Denies intent to harm self/others when asked. Denies A/VH or any physical complaints when asked. No acute distress noted. Active listening, support and encouragement provided. Routine safety checks conducted according to facility protocol. Encouraged patient to notify staff if thoughts of harm toward self or others arise. Patient verbalize understanding and agreement.

## 2024-01-26 NOTE — Progress Notes (Signed)
 Pt has been accepted to [facility] on  01/26/2024 . Bed assignment:158   Pt meets inpatient criteria per Ellouise Dawn, NP   Attending Physician will be Dr. Kandi Hahn  Care Team Notified: [facility] Cherylynn Ernst, RN, Milinda Ringer, RN, Rudell Blacker, RN, Ellouise Dawn, NP

## 2024-01-26 NOTE — BH Assessment (Signed)
 Comprehensive Clinical Assessment (CCA) Note  01/26/2024 Jerome Irwin [id number]  Disposition: Per Leafy Dawn, NP patient does meet inpatient criteria.  FBC is recommended.  Disposition SW to pursue appropriate inpatient options.  The patient demonstrates the following risk factors for suicide: Chronic risk factors for suicide include: substance use disorder. Acute risk factors for suicide include: family or marital conflict. Protective factors for this patient include: positive social support and hope for the future. Considering these factors, the overall suicide risk at this point appears to be low. Patient is appropriate for outpatient follow up.   Jerome Irwin is a 43 year old male accompanied by his mother presenting to [facility] for detox from cocaine. Pt stated he has been raised in [place]  and was in a tough environment and grew up without a father so he had to mature fast and feels this and being in and out of prison have caused depression in him. Pt stated he is not currently experiencing any symptoms other than agitation and moodiness. Pt denied SI/HI/AVH/SIB. Pt has a history of substance abuse since he was around 51 or 43 yo when he began using THC. PT stated he began to use cocaine at age 53 and has been using ewver since with around 3 years sober only when he was in Prison. Pt stated his last released from prison was in 2022 and he has been using cocaine almost daily since then. Pt does endorse using black and mild cigars 1-2x a week but denied use of any other substances. Pt stated he has used cocaine and thc within the last 24 hrs. he stated he may have used cocaine either yesterday or the day before via snorting about .2 grams and THC this morning no more than 2-3 grams. Pt would like detox in preparation for rehab facility.    Treatment options were discussed and patient is in agreement with recommendation for Facility Based Crisis.    Chief Complaint:  Chief Complaint   Patient presents with   Addiction Problem   Visit Diagnosis: Cocaine Use Disorder, Severe    CCA Screening, Triage and Referral (STR)  Patient Reported Information How did you hear about us ? Self  What Is the Reason for Your Visit/Call Today? Jerome Irwin is a 43 year old male accompanied by his mother presenting to [facility] for detox from cocaine. Pt stated he has been raised in [place]  and was in a tough environment and grew up without a father so he had to mature fast and feels this and being in and out of prison have caused depression in him. Pt stated he is not currently experiencing any symptoms other than agitation and moodiness. Pt denied SI/HI/AVH/SIB. Pt has a history of substance abuse since he was around 8 or 43 yo when he began using THC. PT stated he began to use cocaine at age 62 and has been using ewver since with around 3 years sober only when he was in Prison. Pt stated his last released from prison was in 2022 and he has been using cocaine almost daily since then. Pt does endorse using black and mild cigars 1-2x a week but denied use of any other substances. Pt stated he has used cocaine and thc within the last 24 hrs. he stated he may have used cocaine either yesterday or the day before via snorting about .2 grams and THC this morning no more than 2-3 grams. Pt would like detox in preparation for rehab facility.  How Long Has This  Been Causing You Problems? > than 6 months  What Do You Feel Would Help You the Most Today? Alcohol  or Drug Use Treatment   Have You Recently Had Any Thoughts About Hurting Yourself? No  Are You Planning to Commit Suicide/Harm Yourself At This time? No   Flowsheet Row UC from 11/26/2023 in [facility] at [facility] UC from 04/23/2023 in [facility] at [facility] ED from 12/01/2022 in [facility] Emergency Department at [facility]  C-SSRS RISK CATEGORY No Risk No Risk No Risk    Have you Recently Had  Thoughts About Hurting Someone Sherral? No  Are You Planning to Harm Someone at This Time? No  Explanation: n/a  Have You Used Any Alcohol  or Drugs in the Past 24 Hours? Yes  How Long Ago Did You Use Drugs or Alcohol ? This morning What Did You Use and How Much? THC&Cocaine/ 1-2 grams/.2 grams   Do You Currently Have a Therapist/Psychiatrist? no Name of Therapist/Psychiatrist:    Have You Been Recently Discharged From Any Office Practice or Programs? no Explanation of Discharge From Practice/Program: n/a    CCA Screening Triage Referral Assessment Type of Contact: face to face Telemedicine Service Delivery:  face to face Is this Initial or Reassessment?  initial Date Telepsych consult ordered in CH[facility]n/a:    Time Telepsych consult ordered in [facility]L:  N/a  Location of Assessment: [facility] Provider Location: [facility]  Collateral Involvement: none reported  Does Patient Have a Automotive Engineer Guardian? no Legal Guardian Contact Information: n/a Copy of Legal Guardianship Form:  n/a Legal Guardian Notified of Arrival:  n/a Legal Guardian Notified of Pending Discharge:  n/a If Minor and Not Living with Parent(s), Who has Custody?  n/a Is CPS involved or ever been involved?  n/a Is APS involved or ever been involved?  n/a  Patient Determined To Be At Risk for Harm To Self or Others Based on Review of Patient Reported Information or Presenting Complaint?  n/a Method:  n/a Availability of Means:  n/a Intent:  n/a Notification Required:  n/a Additional Information for Danger to Others Potential:  n/a Additional Comments for Danger to Others Potential:  n/a Are There Guns or Other Weapons in Your Home?  n/a Types of Guns/Weapons:  Are These Weapons Safely Secured?  n/a                            Who Could Verify You Are Able To Have These Secured:  n/a Do You Have any Outstanding Charges, Pending Court Dates, Parole/Probation? no Contacted To Inform of Risk of Harm To Self or  Others:  n/a   Does Patient Present under Involuntary Commitment? no   [place] of Residence: [place]  Patient Currently Receiving the Following Services: not receiving services Determination of Need: Routine (7 days)   Options For Referral: Facility-Based Crisis     CCA Biopsychosocial Patient Reported Schizophrenia/Schizoaffective Diagnosis in Past: No   Strengths: well spoken, has support from family.   Mental Health Symptoms Depression:  Irritability; Tearfulness   Duration of Depressive symptoms: Duration of Depressive Symptoms: Greater than two weeks   Mania:  None   Anxiety:   Restlessness; Difficulty concentrating   Psychosis:  None   Duration of Psychotic symptoms:    Trauma:  None   Obsessions:  N/A   Compulsions:  None   Inattention:  None   Hyperactivity/Impulsivity:  None   Oppositional/Defiant Behaviors:  None  Emotional Irregularity:  None   Other Mood/Personality Symptoms:  n/a    Mental Status Exam Appearance and self-care  Stature:  Tall   Weight:  Average weight   Clothing:  Neat/clean   Grooming:  Normal   Cosmetic use:  None   Posture/gait:  Normal   Motor activity:  Not Remarkable   Sensorium  Attention:  Normal   Concentration:  Normal   Orientation:  X5   Recall/memory:  Normal   Affect and Mood  Affect:  Appropriate   Mood:  Anxious   Relating  Eye contact:  Normal   Facial expression:  Anxious   Attitude toward examiner:  Cooperative   Thought and Language  Speech flow: Clear and Coherent   Thought content:  Appropriate to Mood and Circumstances   Preoccupation:  None   Hallucinations:  None   Organization:  Coherent   [employer] of Knowledge:  Average   Intelligence:  Average   Abstraction:  Normal   Judgement:  Normal   Reality Testing:  Adequate   Insight:  Fair   Decision Making:  Vacilates   Social Functioning  Social Maturity:  Impulsive   Social  Judgement:  Chief Of Staff   Stress  Stressors:  Armed Forces Operational Officer (prior incarceration)   Coping Ability:  Overwhelmed   Skill Deficits:  Responsibility; Self-control   Supports:  Family     Religion: Religion/Spirituality Are You A Religious Person?:  (na) How Might This Affect Treatment?: n/a  Leisure/Recreation: Leisure / Recreation Do You Have Hobbies?:  (n/a)  Exercise/Diet: Exercise/Diet Do You Exercise?:  (n/a) Have You Gained or Lost A Significant Amount of Weight in the Past Six Months?: No Do You Follow a Special Diet?: No Do You Have Any Trouble Sleeping?: No   CCA Employment/Education Employment/Work Situation: Employment / Work Situation Employment Situation: Unemployed Patient's Job has Been Impacted by Current Illness: No Has Patient ever Been in Equities Trader?: No  Education: Education Is Patient Currently Attending School?: No Last Grade Completed: 10 Did You Product Manager?: No Did You Have An Individualized Education Program (IIEP): No Did You Have Any Difficulty At School?: No Patient's Education Has Been Impacted by Current Illness: No   CCA Family/Childhood History Family and Relationship History: Family history Marital status: Single Does patient have children?: No  Childhood History:  Childhood History By whom was/is the patient raised?: Both parents Did patient suffer any verbal/emotional/physical/sexual abuse as a child?: No Did patient suffer from severe childhood neglect?: No Has patient ever been sexually abused/assaulted/raped as an adolescent or adult?: No Was the patient ever a victim of a crime or a disaster?: No Witnessed domestic violence?: No Has patient been affected by domestic violence as an adult?: No       CCA Substance Use Alcohol /Drug Use: Alcohol  / Drug Use Pain Medications: See MAR Prescriptions: See MAR Over the Counter: see MAR History of alcohol  / drug use?: Yes Longest period of sobriety (when/how long): 3  years while in prison Negative Consequences of Use: Legal Withdrawal Symptoms: Agitation, Irritability Substance #1 Name of Substance 1: Cocaine 1 - Age of First Use: 25 1 - Amount (size/oz): .2 grms 1 - Frequency: Daily 1 - Duration: 3 years 1 - Last Use / Amount: yesterday or the day before 1 - Method of Aquiring: unk 1- Route of Use: snort Substance #2 Name of Substance 2: THC 2 - Age of First Use[age] 2 - Amount (size/oz): 2-3 grams 2 - Frequency: daily  2 - Duration: unk 2 - Last Use / Amount: this morning 2 - Method of Aquiring: unk 2 - Route of Substance Use: smoking Substance #3 Name of Substance 3: Tobacco 3 - Age of First Use: [age] 3 - Amount (size/oz): unk 3 - Frequency: 1-2x per week 3 - Duration: unk 3 - Last Use / Amount: today 3 - Method of Aquiring: store 3 - Route of Substance Use: smoke                   ASAM's:  Six Dimensions of Multidimensional Assessment  Dimension 1:  Acute Intoxication and/or Withdrawal Potential:   Dimension 1:  Description of individual's past and current experiences of substance use and withdrawal: pt currently using cociane and THC actively. has been to detox before with no major withdrawal symptoms other than mood.  Dimension 2:  Biomedical Conditions and Complications:   Dimension 2:  Description of patient's biomedical conditions and  complications: Ulcerative Collitis, Asthma  Dimension 3:  Emotional, Behavioral, or Cognitive Conditions and Complications:  Dimension 3:  Description of emotional, behavioral, or cognitive conditions and complications: Reported no formal diagnosis of any emotional, bheavioral or cognitive conditions.  Dimension 4:  Readiness to Change:  Dimension 4:  Description of Readiness to Change criteria: Pt stated he would like to change and detox and go to rehab before the new year begins.  Dimension 5:  Relapse, Continued use, or Continued Problem Potential:  Dimension 5:  Relapse, continued use,  or continued problem potential critiera description: no sober time outside of incarceration  Dimension 6:  Recovery/Living Environment:  Dimension 6:  Recovery/Iiving environment criteria description: lives with mother and brothers, described some strain in some of the family relationships  ASAM Severity Score: ASAM's Severity Rating Score: 9  ASAM Recommended Level of Treatment: ASAM Recommended Level of Treatment: Level II Intensive Outpatient Treatment   Substance use Disorder (SUD) Substance Use Disorder (SUD)  Checklist Symptoms of Substance Use: Continued use despite having a persistent/recurrent physical/psychological problem caused/exacerbated by use, Continued use despite persistent or recurrent social, interpersonal problems, caused or exacerbated by use, Evidence of tolerance, Large amounts of time spent to obtain, use or recover from the substance(s), Presence of craving or strong urge to use, Recurrent use that results in a failure to fulfill major role obligations (work, school, home)  Recommendations for Services/Supports/Treatments: Recommendations for Services/Supports/Treatments Recommendations For Services/Supports/Treatments: Individual Therapy, CD-IOP Intensive Chemical Dependency Program  Disposition Recommendation per psychiatric provider: We recommend inpatient psychiatric hospitalization when medically cleared. Patient is under voluntary admission status at this time; please IVC if attempts to leave hospital.   DSM5 Diagnoses: Patient Active Problem List   Diagnosis Date Noted   Cocaine use disorder (HCC) 01/26/2024     Referrals to Alternative Service(s): Referred to Alternative Service(s):   Place:   Date:   Time:    Referred to Alternative Service(s):   Place:   Date:   Time:    Referred to Alternative Service(s):   Place:   Date:   Time:    Referred to Alternative Service(s):   Place:   Date:   Time:     Devaughn Molt

## 2024-01-26 NOTE — ED Provider Notes (Cosign Needed Addendum)
 Facility Based Crisis Admission H&P  Date: 01/26/2024 Patient Name: Jerome Irwin MRN: [id number] Chief Complaint: Cocaine use disorder  Diagnoses:  Final diagnoses:  Cocaine use disorder [facility])    HPI: Jerome Irwin is a 43 year old male.  Patient presents to [facility] behavioral health voluntarily for walk-in assessment.  Patient would like detox from stimulant, cocaine.  He is interested in residential substance use treatment options.  [name] endorses cocaine use average of 4 days/week.  Using approximately 0.2 g daily, via insufflation,.  Last cocaine use 2 days ago.  Also endorsing daily THC use, smoking approximately $10 per day.  Denies alcohol  use, denies nicotine use.  Patient reports 1 previous cocaine detox, admitted for 1 week in [place]  approximately 1 year ago.  Patient states I was not ready that time.  This has been my New Year's resolution because I have been putting this off, I am ready now.  Chart reviewed and patient discussed with attending psychiatrist, Dr. Cole, on 01/26/2024.  Patient is assessed by this nurse practitioner face-to-face.  He is seated, denies physical complaint.  He is alert and oriented, pleasant and cooperative during assessment.  Patient presents with euthymic mood, congruent affect.  Kartier denies SI/HI/AVH.  Denies history of suicide attempts, denies history of nonsuicidal self-harm.  There is no evidence of delusional thought content and no indication that patient is responding to internal stimuli.  Denies history of mental health diagnoses.  He is not followed by outpatient psychiatry.  No history of inpatient psychiatric hospitalization. PHQ-9 score today =4.  Due to ongoing cocaine use recommend admission to [facility] for stabilization, and treatment. The treatment plan, admit to [facility], was discussed with the patient, who verbalized understanding and agreement with plan. Reviewed medications including trazodone , discussed  potential side effects and offered patient opportunity to ask questions.  PHQ 2-9:  Flowsheet Row ED from 01/26/2024 in [facility]  Thoughts that you would be better off dead, or of hurting yourself in some way Not at all  PHQ-9 Total Score 4    Flowsheet Row UC from 11/26/2023 in [facility] UC from 04/23/2023 in [facility] ED from 12/01/2022 in [facility] Emergency Department at [facility]  C-SSRS RISK CATEGORY No Risk No Risk No Risk      Total Time spent with patient: 45 minutes  Musculoskeletal  Strength & Muscle Tone: within normal limits Gait & Station: normal Patient leans: N/A  Psychiatric Specialty Exam  Presentation General Appearance:  Appropriate for Environment; Casual  Eye Contact: Good  Speech: Clear and Coherent; Normal Rate  Speech Volume: Normal  Handedness: Right   Mood and Affect  Mood: Euthymic  Affect: Congruent; Appropriate   Thought Process  Thought Processes: Coherent; Goal Directed; Linear  Descriptions of Associations:Intact  Orientation:Full (Time, Place and Person)  Thought Content:WDL; Logical    Hallucinations:Hallucinations: None  Ideas of Reference:None  Suicidal Thoughts:Suicidal Thoughts: No  Homicidal Thoughts:Homicidal Thoughts: No   Sensorium  Memory: Immediate Good; Recent Good  Judgment: Fair  Insight: Fair   Art Therapist  Concentration: Good  Attention Span: Good  Recall: Good  Fund of Knowledge: Good  Language: Good   Psychomotor Activity  Psychomotor Activity: Psychomotor Activity: Normal   Assets  Assets: Communication Skills; Desire for Improvement; Housing; Resilience; Social Support   Sleep  Sleep: Sleep: Good Number of Hours of Sleep: 8   Nutritional Assessment (For OBS and FBC admissions only)  Has the patient had a weight loss or gain of 10 pounds or more in the  last 3 months?: No Has the patient had a decrease in food intake/or appetite?: No Does the patient have dental problems?: No Does the patient have eating habits or behaviors that may be indicators of an eating disorder including binging or inducing vomiting?: No Has the patient recently lost weight without trying?: 0 Has the patient been eating poorly because of a decreased appetite?: 0 Malnutrition Screening Tool Score: 0    Physical Exam Vitals and nursing note reviewed.  Constitutional:      Appearance: Normal appearance. He is well-developed.  HENT:     Head: Normocephalic and atraumatic.     Nose: Nose normal.  Cardiovascular:     Rate and Rhythm: Normal rate.  Pulmonary:     Effort: Pulmonary effort is normal.  Musculoskeletal:        General: Normal range of motion.     Cervical back: Normal range of motion.  Neurological:     Mental Status: He is alert and oriented to person, place, and time.  Psychiatric:        Attention and Perception: Attention and perception normal.        Mood and Affect: Mood and affect normal.        Speech: Speech normal.        Behavior: Behavior normal. Behavior is cooperative.        Thought Content: Thought content normal.        Cognition and Memory: Cognition and memory normal.    Review of Systems  Constitutional: Negative.   HENT: Negative.    Eyes: Negative.   Respiratory: Negative.    Cardiovascular: Negative.   Gastrointestinal: Negative.   Genitourinary: Negative.   Musculoskeletal: Negative.   Skin: Negative.   Neurological: Negative.   Psychiatric/Behavioral:  Positive for substance abuse.     Blood pressure (!) 125/93, pulse 67, temperature 98.4 F (36.9 C), temperature source Oral, resp. rate 16, SpO2 98%. There is no height or weight on file to calculate BMI.  Past Psychiatric History: Cocaine use disorder   Is the patient at risk to self? No  Has the patient been a risk to self in the past 6 months? No .     Has the patient been a risk to self within the distant past? No   Is the patient a risk to others? No   Has the patient been a risk to others in the past 6 months? No   Has the patient been a risk to others within the distant past? No   Past Medical History: Ulcerative colitis, asthma  Family History: Father: Substance use disorder Social History: Resides with mother and 2 brothers in [place], denies access to weapons, unemployed.  Last Labs:  No visits with results within 6 Month(s) from this visit.  Latest known visit with results is:  Admission on 12/01/2022, Discharged on 12/01/2022  Component Date Value Ref Range Status   Lipase 12/01/2022 106 (H)  11 - 51 U/L Final   Performed at [facility], [address] [place], [place] [address]   Sodium 12/01/2022 138  135 - 145 mmol/L Final   Potassium 12/01/2022 3.7  3.5 - 5.1 mmol/L Final   Chloride 12/01/2022 105  98 - 111 mmol/L Final   CO2 12/01/2022 27  22 - 32 mmol/L Final   Glucose, Bld 12/01/2022 100 (H)  70 - 99 mg/dL Final  Glucose reference range applies only to samples taken after fasting for at least 8 hours.   BUN 12/01/2022 8  6 - 20 mg/dL Final   Creatinine, Ser 12/01/2022 0.99  0.61 - 1.24 mg/dL Final   Calcium 88/96/7975 8.9  8.9 - 10.3 mg/dL Final   Total Protein 88/96/7975 8.1  6.5 - 8.1 g/dL Final   Albumin 88/96/7975 3.7  3.5 - 5.0 g/dL Final   AST 88/96/7975 15  15 - 41 U/L Final   ALT 12/01/2022 11  0 - 44 U/L Final   Alkaline Phosphatase 12/01/2022 147 (H)  38 - 126 U/L Final   Total Bilirubin 12/01/2022 0.7  0.3 - 1.2 mg/dL Final   GFR, Estimated 12/01/2022 >60  >60 mL/min Final   Comment: (NOTE) Calculated using the CKD-EPI Creatinine Equation (2021)    Anion gap 12/01/2022 6  5 - 15 Final   Performed at [facility], [address], [place], [place] [address]   WBC 12/01/2022 6.3  4.0 - 10.5 K/uL Final   RBC 12/01/2022 5.00  4.22 - 5.81 MIL/uL Final    Hemoglobin 12/01/2022 12.1 (L)  13.0 - 17.0 g/dL Final   HCT 88/96/7975 38.5 (L)  39.0 - 52.0 % Final   MCV 12/01/2022 77.0 (L)  80.0 - 100.0 fL Final   MCH 12/01/2022 24.2 (L)  26.0 - 34.0 pg Final   MCHC 12/01/2022 31.4  30.0 - 36.0 g/dL Final   RDW 88/96/7975 18.7 (H)  11.5 - 15.5 % Final   Platelets 12/01/2022 471 (H)  150 - 400 K/uL Final   nRBC 12/01/2022 0.3 (H)  0.0 - 0.2 % Final   Performed at [facility], [address], [place], [place] [address]    Allergies: Patient has no known allergies.  Medications:  Facility Ordered Medications  Medication   acetaminophen  (TYLENOL ) tablet 650 mg   alum & mag hydroxide-simeth (MAALOX/MYLANTA) 200-200-20 MG/5ML suspension 30 mL   magnesium  hydroxide (MILK OF MAGNESIA) suspension 30 mL   haloperidol  (HALDOL ) tablet 5 mg   And   diphenhydrAMINE  (BENADRYL ) capsule 50 mg   haloperidol  lactate (HALDOL ) injection 5 mg   And   diphenhydrAMINE  (BENADRYL ) injection 50 mg   And   LORazepam  (ATIVAN ) injection 2 mg   haloperidol  lactate (HALDOL ) injection 10 mg   And   diphenhydrAMINE  (BENADRYL ) injection 50 mg   And   LORazepam  (ATIVAN ) injection 2 mg   traZODone  (DESYREL ) tablet 50 mg   [START ON 01/27/2024] sulfaSALAzine  (AZULFIDINE ) tablet 1,000 mg   [START ON 01/27/2024] l-methylfolate-B6-B12 (METANX) 3-35-2 MG per tablet 1 tablet   PTA Medications  Medication Sig   sulfaSALAzine  (AZULFIDINE ) 500 MG tablet Take 2 tablets (1,000 mg total) by mouth 3 (three) times daily.   loperamide  (IMODIUM ) 2 MG capsule Take 1 capsule (2 mg total) by mouth 4 (four) times daily as needed for diarrhea or loose stools.   predniSONE  (STERAPRED UNI-PAK 21 TAB) 10 MG (21) TBPK tablet Take by mouth daily. Take 6 tabs by mouth daily  for 2 days, then 5 tabs for 2 days, then 4 tabs for 2 days, then 3 tabs for 2 days, 2 tabs for 2 days, then 1 tab by mouth daily for 2 days   dicyclomine  (BENTYL ) 20 MG tablet Take 1 tablet (20 mg total) by  mouth 4 (four) times daily as needed (intestinal cramps).    Long Term Goals: Improvement in symptoms so as ready for discharge  Short Term  Goals: Patient will verbalize feelings in meetings with treatment team members., Patient will attend at least of 50% of the groups daily., Pt will complete the PHQ9 on admission, day 3 and discharge., Patient will participate in completing the [place] Suicide Severity Rating Scale, Patient will score a low risk of violence for 24 hours prior to discharge, and Patient will take medications as prescribed daily.  Medical Decision Making  PLAN: --Admission: Facility Based Crisis Unit --Disposition: Voluntary --Medical Interventions:    -Labs: CBC, CMP,  TSH, magnesium  and Ethanol. EKG. Urine drug screen.     -Medications:      -acetaminophen  650 mg Q6H PRN for mild pain (pain score 1-3)      -Maalox suspension 30 mLs Q4H PRN for indigestion      -magnesium  hydroxide suspension 30 mLs daily PRN for mild constipation      -trazodone  50 mg daily at bedtime PRN for sleep      - I methylfolate-B6-B12 3-35-2 mg 1 tablet oral daily/folate supplementation     -Continue home medications: -Dicyclomine  20 mg oral 4 times daily as needed/intestinal cramping -Loperamide  2 mg 4 times daily as needed/diarrhea or loose stools -Sulfasalazine  tablet 1000 mg TID/ ulcerative colitis   --Psychiatric Interventions:    -Agitation Protocol   Discharge Planning: Disposition/case management to assist with discharge planning and identification of follow-up needs including residential or outpatient substance use treatment options as needed, prior to discharge Discharge Concerns: Need to confirm safety plan; Medication compliance and effectiveness Discharge Goals: Return home with outpatient referrals for mental health follow-up including medication management/psychotherapy and substance use treatment follow-up resources as appropriate    Recommendations  Based on my  evaluation the patient does not appear to have an emergency medical condition.  Ellouise LITTIE Dawn, FNP 01/26/2024  7:01 PM

## 2024-01-26 NOTE — ED Notes (Signed)
 Report relayed to Fidela, RN patient ready for transition to [facility]

## 2024-01-27 DIAGNOSIS — F1510 Other stimulant abuse, uncomplicated: Secondary | ICD-10-CM | POA: Diagnosis not present

## 2024-01-27 DIAGNOSIS — F1410 Cocaine abuse, uncomplicated: Secondary | ICD-10-CM | POA: Diagnosis not present

## 2024-01-27 DIAGNOSIS — Z87891 Personal history of nicotine dependence: Secondary | ICD-10-CM | POA: Diagnosis not present

## 2024-01-27 MED ORDER — B COMPLEX-C PO TABS
1.0000 | ORAL_TABLET | Freq: Every day | ORAL | Status: DC
  Administered 2024-01-27 – 2024-01-29 (×3): 1 via ORAL
  Filled 2024-01-27 (×3): qty 1

## 2024-01-27 NOTE — ED Notes (Signed)
 Pt ate lunch. No concerns or complaints reported to RN. No signs of distress observed.

## 2024-01-27 NOTE — ED Notes (Signed)
 Patient is in the bedroom sleeping, NAD.

## 2024-01-27 NOTE — ED Notes (Signed)
 Pt presents as pleasant and cooperative. Pt reports feeling 'better than yesterday'. Pt wearing mask over his mouth, saying that he would like to prevent catching covid virus. Pt denies si hi and avh, verbal contract for safety provided. Pt denies physical pain or discomfort. Pt reports to have eaten breakfast. Medications reviewed, questions denied.

## 2024-01-27 NOTE — ED Provider Notes (Signed)
 Behavioral Health Progress Note  Date and Time: 01/27/2024 4:36 PM Name: Jerome Irwin MRN:  [id number]  HPI: Per initial assessment completed on 01/26/2024:  Jerome Irwin is a 43 year old male. Patient presents to [facility] behavioral health voluntarily for walk-in assessment. Patient would like detox from stimulant, cocaine. He is interested in residential substance use treatment options.  Dyanna, Ellouise CROME, FNP, Date of Service: 01/26/2024  6:01 PM).  Patient was recommended to admission in the [facility] with a goal of assisting him with regaining & maintaining his sobriety.  Assessment, 01/27/2024: Pt presents today with a  euthymic mood, attention to personal hygiene and grooming is fair, eye contact is good, speech is clear & coherent. Thought contents are organized and logical, and pt currently denies SI/HI/AVH or paranoia. There is no evidence of delusional thoughts.  He denies first rank symptoms and there are no overt signs of psychosis.  Pt reports cocaine use since 2007, states that he uses a .2 daily, reports that he has had episodic sobriety since he began using this substance, and his use is purely recreational in nature. Pt shares that  he started off by being curious, then I liked it. He reports multiple incarcerations including spending 3 yrs in prison for drug possession, and states that that time was his longest period of sobriety. He reports that he is wanting to go to a rehabilitation facility, but states that he would like to go to either a sober [facility] or an [address]. Shares that he would like to work while he is in a program. Pt is coordinating with CSW to get him into one of these programs.  Regarding sleep, pt states that he had a difficult time going to sleep last night, asking for a PRN Trazodone  which clinical research associate stated would be ordered for him. We discussed rationales, benefits and possible side effects of this  medication. Pt not receptive to other psychotropic medication trials at this this time as he states that his cocaine use is for recreational purposes only.  He reports a good appetite, denies being in any physical distress  today, seems to be motivated for rehab. Tentative discharge date is for 1/3.   Labs Reviewed: Ordered B12, Vitamin D levels, Ha1c, lipid panel as pt has not had these labs in the -past year. EKG with Qtc of 420.   Diagnosis:  Final diagnoses:  Cocaine use disorder (HCC)  Stimulant use disorder   Total Time spent with patient: 45 minutes  Past Psychiatric History: Methamphetamine abuse  Past Medical History Past Medical History:  Diagnosis Date   Asthma    Bronchitis    Pneumonia    Ulcerative colitis (HCC)     Family History:  Family History  Problem Relation Age of Onset   Healthy Mother    Healthy Father     Family Psychiatric  History: not provided Social History:  Social History   Socioeconomic History   Marital status: Single    Spouse name: Not on file   Number of children: Not on file   Years of education: Not on file   Highest education level: Not on file  Occupational History   Not on file  Tobacco Use   Smoking status: Former    Types: Cigarettes   Smokeless tobacco: Never  Vaping Use   Vaping status: Never Used  Substance and Sexual Activity   Alcohol  use: Not Currently   Drug use: Yes    Types: Cocaine,  Marijuana    Comment: occasional cocaine use - last use few days ago; daily marijuana use   Sexual activity: Yes  Other Topics Concern   Not on file  Social History Narrative   Not on file   Social Drivers of Health   Tobacco Use: Medium Risk (11/26/2023)   Patient History    Smoking Tobacco Use: Former    Smokeless Tobacco Use: Never    Passive Exposure: Not on Actuary Strain: Not on file  Food Insecurity: No Food Insecurity (01/26/2024)   Epic    Worried About Programme Researcher, Broadcasting/film/video in the Last Year:  Never true    Ran Out of Food in the Last Year: Never true  Recent Concern: Food Insecurity - Food Insecurity Present (01/26/2024)   Epic    Worried About Programme Researcher, Broadcasting/film/video in the Last Year: Sometimes true    Ran Out of Food in the Last Year: Sometimes true  Transportation Needs: No Transportation Needs (01/26/2024)   Epic    Lack of Transportation (Medical): No    Lack of Transportation (Non-Medical): No  Physical Activity: Not on file  Stress: Not on file  Social Connections: Unknown (06/06/2021)   Received from [facility]   Social Network    Social Network: Not on file  Intimate Partner Violence: Not At Risk (01/26/2024)   Epic    Fear of Current or Ex-Partner: No    Emotionally Abused: No    Physically Abused: No    Sexually Abused: No  Depression (PHQ2-9): Medium Risk (01/27/2024)   Depression (PHQ2-9)    PHQ-2 Score: 9  Alcohol  Screen: Not on file  Housing: Not on file  Utilities: Patient Unable To Answer (01/26/2024)   Epic    Threatened with loss of utilities: Patient unable to answer  Health Literacy: Not on file   Sleep: Poor  Appetite:  Fair  Current Medications:  Current Facility-Administered Medications  Medication Dose Route Frequency Provider Last Rate Last Admin   acetaminophen  (TYLENOL ) tablet 650 mg  650 mg Oral Q6H PRN Dasie Ellouise CROME, FNP       alum & mag hydroxide-simeth (MAALOX/MYLANTA) 200-200-20 MG/5ML suspension 30 mL  30 mL Oral Q4H PRN Dasie Ellouise CROME, FNP       B-complex with vitamin C tablet 1 tablet  1 tablet Oral Daily Bethea, Terrence C, MD   1 tablet at 01/27/24 1015   dicyclomine  (BENTYL ) tablet 20 mg  20 mg Oral QID PRN Dasie Ellouise CROME, FNP       haloperidol  (HALDOL ) tablet 5 mg  5 mg Oral TID PRN Dasie Ellouise CROME, FNP       And   diphenhydrAMINE  (BENADRYL ) capsule 50 mg  50 mg Oral TID PRN Dasie Ellouise CROME, FNP       haloperidol  lactate (HALDOL ) injection 5 mg  5 mg Intramuscular TID PRN Dasie Ellouise CROME, FNP       And   diphenhydrAMINE   (BENADRYL ) injection 50 mg  50 mg Intramuscular TID PRN Dasie Ellouise CROME, FNP       And   LORazepam  (ATIVAN ) injection 2 mg  2 mg Intramuscular TID PRN Dasie Ellouise CROME, FNP       haloperidol  lactate (HALDOL ) injection 10 mg  10 mg Intramuscular TID PRN Dasie Ellouise CROME, FNP       And   diphenhydrAMINE  (BENADRYL ) injection 50 mg  50 mg Intramuscular TID PRN Dasie Ellouise CROME, FNP  And   LORazepam  (ATIVAN ) injection 2 mg  2 mg Intramuscular TID PRN Allen, Tina L, FNP       loperamide  (IMODIUM ) capsule 2 mg  2 mg Oral QID PRN Allen, Tina L, FNP       magnesium  hydroxide (MILK OF MAGNESIA) suspension 30 mL  30 mL Oral Daily PRN Allen, Tina L, FNP       sulfaSALAzine  (AZULFIDINE ) tablet 1,000 mg  1,000 mg Oral TID Dasie Ellouise CROME, FNP   1,000 mg at 01/27/24 1623   traZODone  (DESYREL ) tablet 50 mg  50 mg Oral QHS PRN Dasie Ellouise CROME, FNP       Current Outpatient Medications  Medication Sig Dispense Refill   dicyclomine  (BENTYL ) 20 MG tablet Take 1 tablet (20 mg total) by mouth 4 (four) times daily as needed (intestinal cramps). 20 tablet 0   loperamide  (IMODIUM ) 2 MG capsule Take 1 capsule (2 mg total) by mouth 4 (four) times daily as needed for diarrhea or loose stools. 12 capsule 0   Multiple Vitamin (MULTIVITAMIN WITH MINERALS) TABS tablet Take 1 tablet by mouth daily.     sulfaSALAzine  (AZULFIDINE ) 500 MG tablet Take 2 tablets (1,000 mg total) by mouth 3 (three) times daily. 180 tablet 0    Labs  Lab Results:  Admission on 01/26/2024, Discharged on 01/26/2024  Component Date Value Ref Range Status   WBC 01/26/2024 4.5  4.0 - 10.5 K/uL Final   RBC 01/26/2024 4.75  4.22 - 5.81 MIL/uL Final   Hemoglobin 01/26/2024 12.6 (L)  13.0 - 17.0 g/dL Final   HCT 87/70/7974 39.7  39.0 - 52.0 % Final   MCV 01/26/2024 83.6  80.0 - 100.0 fL Final   MCH 01/26/2024 26.5  26.0 - 34.0 pg Final   MCHC 01/26/2024 31.7  30.0 - 36.0 g/dL Final   RDW 87/70/7974 17.0 (H)  11.5 - 15.5 % Final   Platelets 01/26/2024 326  150  - 400 K/uL Final   nRBC 01/26/2024 0.0  0.0 - 0.2 % Final   Neutrophils Relative % 01/26/2024 37  % Final   Neutro Abs 01/26/2024 1.7  1.7 - 7.7 K/uL Final   Lymphocytes Relative 01/26/2024 47  % Final   Lymphs Abs 01/26/2024 2.1  0.7 - 4.0 K/uL Final   Monocytes Relative 01/26/2024 9  % Final   Monocytes Absolute 01/26/2024 0.4  0.1 - 1.0 K/uL Final   Eosinophils Relative 01/26/2024 6  % Final   Eosinophils Absolute 01/26/2024 0.3  0.0 - 0.5 K/uL Final   Basophils Relative 01/26/2024 1  % Final   Basophils Absolute 01/26/2024 0.1  0.0 - 0.1 K/uL Final   Immature Granulocytes 01/26/2024 0  % Final   Abs Immature Granulocytes 01/26/2024 0.01  0.00 - 0.07 K/uL Final   Performed at [facility], [address] [place], [place] [address]   Sodium 01/26/2024 140  135 - 145 mmol/L Final   Potassium 01/26/2024 3.6  3.5 - 5.1 mmol/L Final   Chloride 01/26/2024 104  98 - 111 mmol/L Final   CO2 01/26/2024 24  22 - 32 mmol/L Final   Glucose, Bld 01/26/2024 103 (H)  70 - 99 mg/dL Final   Glucose reference range applies only to samples taken after fasting for at least 8 hours.   BUN 01/26/2024 9  6 - 20 mg/dL Final   Creatinine, Ser 01/26/2024 1.01  0.61 - 1.24 mg/dL Final   Calcium 87/70/7974 9.7  8.9 - 10.3 mg/dL Final  Total Protein 01/26/2024 7.2  6.5 - 8.1 g/dL Final   Albumin 87/70/7974 4.4  3.5 - 5.0 g/dL Final   AST 87/70/7974 21  15 - 41 U/L Final   ALT 01/26/2024 12  0 - 44 U/L Final   Alkaline Phosphatase 01/26/2024 98  38 - 126 U/L Final   Total Bilirubin 01/26/2024 0.3  0.0 - 1.2 mg/dL Final   GFR, Estimated 01/26/2024 >60  >60 mL/min Final   Comment: (NOTE) Calculated using the CKD-EPI Creatinine Equation (2021)    Anion gap 01/26/2024 12  5 - 15 Final   Performed at [facility], [address], [place], [place] [address]   Magnesium  01/26/2024 2.0  1.7 - 2.4 mg/dL Final   Performed at [facility], [address], [place], [place] [address]   Alcohol ,  Ethyl (B) 01/26/2024 <15  <15 mg/dL Final   Comment: (NOTE) For medical purposes only. Performed at [facility], [address] [place], [place] [address]    TSH 01/26/2024 1.890  0.350 - 4.500 uIU/mL Final   Performed at [facility], [address], [place], [place] [address]   POC Amphetamine UR 01/26/2024 None Detected  NONE DETECTED (Cut Off Level 1000 ng/mL) Final   POC Secobarbital (BAR) 01/26/2024 None Detected  NONE DETECTED (Cut Off Level 300 ng/mL) Final   POC Buprenorphine (BUP) 01/26/2024 None Detected  NONE DETECTED (Cut Off Level 10 ng/mL) Final   POC Oxazepam (BZO) 01/26/2024 None Detected  NONE DETECTED (Cut Off Level 300 ng/mL) Final   POC Cocaine UR 01/26/2024 None Detected (A)  NONE DETECTED (Cut Off Level 300 ng/mL) Final   POC Methamphetamine UR 01/26/2024 None Detected  NONE DETECTED (Cut Off Level 1000 ng/mL) Final   POC Morphine 01/26/2024 None Detected  NONE DETECTED (Cut Off Level 300 ng/mL) Final   POC Methadone UR 01/26/2024 None Detected  NONE DETECTED (Cut Off Level 300 ng/mL) Final   POC Oxycodone UR 01/26/2024 None Detected  NONE DETECTED (Cut Off Level 100 ng/mL) Final   POC Marijuana UR 01/26/2024 Positive (A)  NONE DETECTED (Cut Off Level 50 ng/mL) Final    Blood Alcohol  level:  Lab Results  Component Value Date   [facility] <15 01/26/2024    Metabolic Disorder Labs: No results found for: HGBA1C, MPG No results found for: PROLACTIN No results found for: CHOL, TRIG, HDL, CHOLHDL, VLDL, LDLCALC  Therapeutic Lab Levels: No results found for: LITHIUM No results found for: VALPROATE No results found for: CBMZ  Physical Findings   PHQ2-9    Flowsheet Row ED from 01/26/2024 in [facility] Most recent reading at 01/27/2024  1:40 PM ED from 01/26/2024 in [facility] Most recent reading at 01/26/2024  6:22 PM  PHQ-2 Total Score 2 2  PHQ-9 Total Score 9 4    Flowsheet Row ED from 01/26/2024 in [facility] Most recent reading at 01/27/2024  1:40 PM ED from 01/26/2024 in [facility] Most recent reading at 01/26/2024 10:14 PM UC from 11/26/2023 in [facility] Most recent reading at 11/26/2023  1:25 PM  C-SSRS RISK CATEGORY Error: Q3, 4, or 5 should not be populated when Q2 is No No Risk No Risk     Musculoskeletal  Strength & Muscle Tone: within normal limits Gait & Station: normal Patient leans: N/A  Psychiatric Specialty Exam  Presentation  General Appearance:  Casual  Eye Contact: Fair  Speech: Clear and Coherent  Speech Volume: Normal  Handedness: Right   Mood and Affect  Mood: Depressed; Anxious  Affect: Congruent   Thought Process  Thought Processes: Coherent  Descriptions of Associations:Intact  Orientation:Full (Time, Place and Person)  Thought Content:Logical  Diagnosis of Schizophrenia or Schizoaffective disorder in past: No    Hallucinations:Hallucinations: None  Ideas of Reference:None  Suicidal Thoughts:Suicidal Thoughts: No  Homicidal Thoughts:Homicidal Thoughts: No   Sensorium  Memory: Immediate Fair  Judgment: Good  Insight: Good   Executive Functions  Concentration: Good  Attention Span: Good  Recall: Good  Fund of Knowledge: Good  Language: Good   Psychomotor Activity  Psychomotor Activity: Psychomotor Activity: Normal   Assets  Assets: Desire for Improvement; Resilience   Sleep  Sleep: Sleep: Poor  Estimated Sleeping Duration (Last 24 Hours): 8.25-9.25 hours  Nutritional Assessment (For OBS and [facility] admissions only) Has the patient had a weight loss or gain of 10 pounds or more in the last 3 months?: No Has the patient had a decrease in food intake/or appetite?: No Does the patient have dental problems?: No Does the patient have eating habits or behaviors that may  be indicators of an eating disorder including binging or inducing vomiting?: No Has the patient recently lost weight without trying?: 0 Has the patient been eating poorly because of a decreased appetite?: 0 Malnutrition Screening Tool Score: 0    Physical Exam  Physical Exam Vitals and nursing note reviewed.  Musculoskeletal:        General: Normal range of motion.  Neurological:     Mental Status: He is oriented to person, place, and time.    Review of Systems  Psychiatric/Behavioral:  Positive for substance abuse. Negative for depression, hallucinations, memory loss and suicidal ideas. The patient is nervous/anxious and has insomnia.   All other systems reviewed and are negative.  Blood pressure (!) 138/92, pulse 79, temperature 97.9 F (36.6 C), temperature source Oral, resp. rate 17, SpO2 99%. There is no height or weight on file to calculate BMI.  Treatment Plan Summary: Daily contact with patient to assess and evaluate symptoms and progress in treatment and Medication management  Meds Declines Psychotropics at this time Continue the following:  B-complex with vitamin C  1 tablet Oral Daily   sulfaSALAzine   1,000 mg Oral TID    PRNS -Start Trazodone  50 mg nightly PRN for sleep -Continue Tylenol  650 mg every 6 hours PRN for mild pain -Continue Maalox 30 mg every 4 hrs PRN for indigestion -Continue Milk of Magnesia as needed every 6 hrs for constipation -Continue Abuterol inhaler every 6 hours PRN for wheezing/SOB  Discharge Planning: Social work and case management to assist with discharge planning and identification of hospital follow-up needs prior to discharge Estimated LOS: 3-5days Discharge Concerns: Need to establish a safety plan; Medication compliance and effectiveness Discharge Goals: Return home with outpatient referrals for mental health follow-up including medication management/psychotherapy   Total Time Spent in Direct Patient Care:  I personally spent  50 minutes on the unit in direct patient care. The direct patient care time included face-to-face time with the patient, reviewing the patient's chart, communicating with other professionals, and coordinating care. Greater than 50% of this time was spent in counseling or coordinating care with the patient regarding goals of hospitalization, psycho-education, and discharge planning needs.   Donia Snell, NP 01/27/2024 4:36 PM

## 2024-01-27 NOTE — Group Note (Signed)
 Group Topic: Understanding Self  Group Date: 01/27/2024 Start Time: 1200 End Time: 1225 Facilitators: Daved Tinnie HERO, RN  Department: [facility]  Number of Participants: 7  Group Focus: nursing group Treatment Modality:  Psychoeducation Interventions utilized were patient education Purpose: increase insight  Name: Jerome Irwin Date of Birth: [birth year]  MR: [id number]    Level of Participation: when cued Quality of Participation: attentive and cooperative Interactions with others: gave feedback Mood/Affect: appropriate Triggers (if applicable): n/a Cognition: coherent/clear Progress: Gaining insight Response: pt says he will maintain sobriety Plan: patient will be encouraged to attend future RN education groups  Patients Problems:  Patient Active Problem List   Diagnosis Date Noted   Cocaine use disorder (HCC) 01/26/2024

## 2024-01-27 NOTE — ED Notes (Signed)
 Pt continues to present as calm and cooperative. No concerns or complaints reported to RN, no signs of distress observed.

## 2024-01-27 NOTE — Group Note (Signed)
 Group Topic: Communication  Group Date: 01/27/2024 Start Time: 1245 End Time: 1330 Facilitators: Alyse Leilani LABOR, NT  Department: [facility]  Number of Participants: 7  Group Focus: chemical dependency issues, clarity of thought, and communication Treatment Modality:  Psychoeducation Interventions utilized were group exercise, problem solving, and support Purpose: enhance coping skills, relapse prevention strategies, and trigger / craving management  Name: Jerome Irwin Date of Birth: [birth year]  MR: [id number]    Level of Participation: active Quality of Participation: cooperative and supportive Interactions with others: gave feedback Mood/Affect: bright and positive Triggers (if applicable): none Cognition: coherent/clear, goal directed, and insightful Progress: Minimal Response: Listened Plan: patient will be encouraged to keep coming to group  Patients Problems:  Patient Active Problem List   Diagnosis Date Noted   Cocaine use disorder (HCC) 01/26/2024

## 2024-01-27 NOTE — Group Note (Unsigned)
 Group Topic: Overcoming Obstacles  Group Date: 01/27/2024 Start Time: 2000 End Time: 2100 Facilitators: Carollynn Genre, NT  Department: [facility]  Number of Participants: 4  Group Focus: daily focus Treatment Modality:  Patient-Centered Therapy Interventions utilized were problem solving Purpose: regain self-worth   Name: Shone Leventhal Date of Birth: [birth year]  MR: [id number]    Level of Participation: {THERAPIES; PSYCH GROUP PARTICIPATION OZCZO:76008} Quality of Participation: {THERAPIES; PSYCH QUALITY OF PARTICIPATION:23992} Interactions with others: {THERAPIES; PSYCH INTERACTIONS:23993} Mood/Affect: {THERAPIES; PSYCH MOOD/AFFECT:23994} Triggers (if applicable): *** Cognition: {THERAPIES; PSYCH COGNITION:23995} Progress: {THERAPIES; PSYCH PROGRESS:23997} Response: *** Plan: {THERAPIES; PSYCH EOJW:76003}  Patients Problems:  Patient Active Problem List   Diagnosis Date Noted   Cocaine use disorder (HCC) 01/26/2024

## 2024-01-27 NOTE — ED Notes (Signed)
 RN spoke with Jerome Irwin A&Ox4. Denies intent to harm self/others when asked. Denies A/VH or any physical complaints when asked. No acute distress noted. Jerome Irwin is quiet but answers when spoken to. Active listening, support and encouragement provided. Routine safety checks conducted according to facility protocol. Encouraged Jerome Irwin to notify staff if thoughts of harm toward self or others arise. Jerome Irwin verbalize understanding and agreement.Snack and refreshments offered.

## 2024-01-27 NOTE — ED Notes (Signed)
 Patient admitted into [facility] for ETOH detox. On arrival, pt AO/4.MAE, Denies SI/HI/AVH. Oriented to unit and unit rules. Skin assessment WNL. Environment secured per policy. Verbalized understanding of the the admission process. Accompanied pt to his room. Will monitor for safety

## 2024-01-27 NOTE — ED Notes (Signed)
Patient sleeping with no s/s of distress.

## 2024-01-27 NOTE — Care Management (Signed)
 [facility] Care Management...  Writer met with patient to discuss discharge planning  Patient reported previous inpatient treatment facilities and not really interested in going inpatient. Patient reported looking to get out of [place] area and interested in [employer] or Chief Strategy Officer provided patient with [employer] listing for [facility] ([place]) and [employer] pamphlet to call check on [employer]/designer advised patient that [employer] could have vacancies anywhere in the [place] 

## 2024-01-27 NOTE — Group Note (Signed)
 Group Topic: Relapse and Recovery  Group Date: 01/27/2024 Start Time: 1100 End Time: 1150 Facilitators: Gerome Jolly, NT  Department: [facility]  Number of Participants: 4  Group Focus: chemical dependency education Treatment Modality:  Cognitive Behavioral Therapy and Spiritual Interventions utilized were exploration, problem solving, story telling, and support Purpose: explore maladaptive thinking, express irrational fears, increase insight, and relapse prevention strategies  Name: Jerome Irwin Date of Birth: 1980/09/28  MR: [id number]    Level of Participation: minimal Quality of Participation: attentive and engaged Interactions with others: gave feedback Mood/Affect: appropriate Triggers (if applicable): na Cognition: coherent/clear Progress: Moderate Response:  patient did not say too much during group however, he did acknowledge having a fear of change. Patient reported being done with substance  and wanting to get his life straightened out    Plan: follow-up needed  Patients Problems:  Patient Active Problem List   Diagnosis Date Noted   Cocaine use disorder (HCC) 01/26/2024

## 2024-01-28 DIAGNOSIS — F1410 Cocaine abuse, uncomplicated: Secondary | ICD-10-CM | POA: Diagnosis not present

## 2024-01-28 DIAGNOSIS — Z87891 Personal history of nicotine dependence: Secondary | ICD-10-CM | POA: Diagnosis not present

## 2024-01-28 DIAGNOSIS — F1510 Other stimulant abuse, uncomplicated: Secondary | ICD-10-CM | POA: Diagnosis not present

## 2024-01-28 NOTE — ED Notes (Signed)
Patient resting with no s/s of distress

## 2024-01-28 NOTE — Group Note (Signed)
 Group Topic: Wellness  Group Date: 01/28/2024 Start Time: 1200 End Time: 1220 Facilitators: Daved Tinnie HERO, RN  Department: [facility]  Number of Participants: 6  Group Focus: nursing group Treatment Modality:  Psychoeducation Interventions utilized were patient education Purpose: increase insight  Name: Sergi Gellner Date of Birth: [birth year]  MR: [id number]    Level of Participation: when cued Quality of Participation: attentive and cooperative Interactions with others: gave feedback Mood/Affect: appropriate Triggers (if applicable): n/a Cognition: coherent/clear Progress: Gaining insight Response: pt says he will set a goal of 'being sober' for the new year Plan: patient will be encouraged to attend future RN education groups  Patients Problems:  Patient Active Problem List   Diagnosis Date Noted   Cocaine use disorder (HCC) 01/26/2024

## 2024-01-28 NOTE — ED Notes (Signed)
 Pt presents as pleasant, calm, and cooperative, easy to engage in conversation. Pt says he feels 'good', ate breakfast. Pt c/o diarrhea, prn immodium given. Pt denies si hi and avh, verbal contract for safety provided. Medications reviewed, questions denied.

## 2024-01-28 NOTE — ED Notes (Signed)
 Pt currently on phone. Pt continues to present as calm and cooperative. Pt ate lunch. No concerns or complaints reported, no signs of distress observed.

## 2024-01-28 NOTE — Care Management (Signed)
 [facility] Care Management...  Client requested a copy of [employer] in [place].  Writer printed off the list and gave them to the client.  Client reports he will contact some of the [facility]'s this evening.

## 2024-01-28 NOTE — Care Management (Signed)
 [facility] Care Management...  Patient reported making contact with [facility] in [place] however the house not on bus line.   Patient reported that he was informed to contact [facility] in [place], more access to bus being on bus Financial Risk Analyst provided patient with listing of vacancies in [place] [employer] will follow up with Teacher, Music met with patient to discuss discharge planning  Patient reported that he was been able to make contact with any [employer] yesterday

## 2024-01-28 NOTE — Care Plan (Signed)
 Interdisciplinary Treatment and Diagnostic Plan Update  01/28/2024 Time of Session: 2pm Jerome Irwin MRN: [id number]  Diagnosis:  Final diagnoses:  Cocaine use disorder (HCC)  Stimulant use disorder     Current Medications:  Current Facility-Administered Medications  Medication Dose Route Frequency Provider Last Rate Last Admin   acetaminophen  (TYLENOL ) tablet 650 mg  650 mg Oral Q6H PRN Dasie Ellouise CROME, FNP       alum & mag hydroxide-simeth (MAALOX/MYLANTA) 200-200-20 MG/5ML suspension 30 mL  30 mL Oral Q4H PRN Dasie Ellouise CROME, FNP       B-complex with vitamin C tablet 1 tablet  1 tablet Oral Daily Bethea, Terrence C, MD   1 tablet at 01/28/24 9083   dicyclomine  (BENTYL ) tablet 20 mg  20 mg Oral QID PRN Allen, Tina L, FNP       haloperidol  (HALDOL ) tablet 5 mg  5 mg Oral TID PRN Dasie Ellouise CROME, FNP       And   diphenhydrAMINE  (BENADRYL ) capsule 50 mg  50 mg Oral TID PRN Dasie Ellouise CROME, FNP       haloperidol  lactate (HALDOL ) injection 5 mg  5 mg Intramuscular TID PRN Dasie Ellouise CROME, FNP       And   diphenhydrAMINE  (BENADRYL ) injection 50 mg  50 mg Intramuscular TID PRN Dasie Ellouise CROME, FNP       And   LORazepam  (ATIVAN ) injection 2 mg  2 mg Intramuscular TID PRN Dasie Ellouise CROME, FNP       haloperidol  lactate (HALDOL ) injection 10 mg  10 mg Intramuscular TID PRN Dasie Ellouise CROME, FNP       And   diphenhydrAMINE  (BENADRYL ) injection 50 mg  50 mg Intramuscular TID PRN Dasie Ellouise CROME, FNP       And   LORazepam  (ATIVAN ) injection 2 mg  2 mg Intramuscular TID PRN Allen, Tina L, FNP       loperamide  (IMODIUM ) capsule 2 mg  2 mg Oral QID PRN Dasie Ellouise CROME, FNP   2 mg at 01/28/24 1649   magnesium  hydroxide (MILK OF MAGNESIA) suspension 30 mL  30 mL Oral Daily PRN Dasie Ellouise CROME, FNP       sulfaSALAzine  (AZULFIDINE ) tablet 1,000 mg  1,000 mg Oral TID Dasie Ellouise CROME, FNP   1,000 mg at 01/28/24 1648   traZODone  (DESYREL ) tablet 50 mg  50 mg Oral QHS PRN Dasie Ellouise CROME, FNP   50 mg at 01/27/24 2139    Current Outpatient Medications  Medication Sig Dispense Refill   dicyclomine  (BENTYL ) 20 MG tablet Take 1 tablet (20 mg total) by mouth 4 (four) times daily as needed (intestinal cramps). 20 tablet 0   loperamide  (IMODIUM ) 2 MG capsule Take 1 capsule (2 mg total) by mouth 4 (four) times daily as needed for diarrhea or loose stools. 12 capsule 0   Multiple Vitamin (MULTIVITAMIN WITH MINERALS) TABS tablet Take 1 tablet by mouth daily.     sulfaSALAzine  (AZULFIDINE ) 500 MG tablet Take 2 tablets (1,000 mg total) by mouth 3 (three) times daily. 180 tablet 0   PTA Medications: Prior to Admission medications  Medication Sig Start Date End Date Taking? Authorizing Provider  dicyclomine  (BENTYL ) 20 MG tablet Take 1 tablet (20 mg total) by mouth 4 (four) times daily as needed (intestinal cramps). 11/26/23  Yes Iola Lukes, FNP  loperamide  (IMODIUM ) 2 MG capsule Take 1 capsule (2 mg total) by mouth 4 (four) times daily as needed for diarrhea or loose stools. 11/26/23  Yes Iola Lukes, FNP  Multiple Vitamin (MULTIVITAMIN WITH MINERALS) TABS tablet Take 1 tablet by mouth daily.   Yes [provider]  sulfaSALAzine  (AZULFIDINE ) 500 MG tablet Take 2 tablets (1,000 mg total) by mouth 3 (three) times daily. 11/26/23 01/27/24 Yes Iola Lukes, FNP  cetirizine  (ZYRTEC ) 5 MG tablet Take 2 tablets (10 mg total) by mouth daily. 08/30/18 10/29/18  Murray, Alyssa B, PA-C  ferrous sulfate 325 (65 FE) MG EC tablet Take 325 mg by mouth 3 (three) times daily with meals.  10/29/18  [provider]  fluticasone  (FLONASE ) 50 MCG/ACT nasal spray Place 1 spray into both nostrils daily as needed for allergies or rhinitis. 08/30/18 01/06/19  Jason Fish B, PA-C  Fluticasone -Salmeterol (ADVAIR) 100-50 MCG/DOSE AEPB Inhale 1 puff into the lungs 2 (two) times daily. 09/06/17 12/25/18  Wieters, Hallie C, PA-C  montelukast  (SINGULAIR ) 10 MG tablet Take 1 tablet (10 mg total) by mouth at bedtime. 12/13/18  01/06/19  Van Knee, MD    Patient Stressors: Substance abuse    Patient Strengths: Communication skills  Motivation for treatment/growth  Work skills   Treatment Modalities: Medication Management, Group therapy, Case management,  1 to 1 session with clinician, Psychoeducation, Recreational therapy.   Physician Treatment Plan for Primary and Secondary Diagnosis:  Final diagnoses:  Cocaine use disorder (HCC)  Stimulant use disorder   Long Term Goal(s):    Short Term Goals:    Medication Management: Evaluate patient's response, side effects, and tolerance of medication regimen.  Therapeutic Interventions: 1 to 1 sessions, Unit Group sessions and Medication administration.  Evaluation of Outcomes: Progressing  LCSW Treatment Plan for Primary Diagnosis:  Final diagnoses:  Cocaine use disorder (HCC)  Stimulant use disorder    Long Term Goal(s): Safe transition to appropriate next level of care at discharge.  Short Term Goals: Facilitate acceptance of mental health diagnosis and concerns through verbal commitment to aftercare plan and appointments at discharge.  Therapeutic Interventions: Assess for all discharge needs, 1 to 1 time with Child psychotherapist, Explore available resources and support systems, Assess for adequacy in community support network, Educate family and significant other(s) on suicide prevention, Complete Psychosocial Assessment, Interpersonal group therapy.  Evaluation of Outcomes: Progressing   Progress in Treatment: Attending groups: Yes. Participating in groups: Yes. Taking medication as prescribed: Yes. Toleration medication: No. Family/Significant other contact made: Yes Patient understands diagnosis: Yes. Discussing patient identified problems/goals with staff: Yes. Medical problems stabilized or resolved: Yes. Denies suicidal/homicidal ideation: Yes. Issues/concerns per patient self-inventory: No. Other: NA  New problem(s) identified: No,  Describe:  NA  New Short Term/Long Term Goal(s):  enter into a [facility] so I can work and get help at the same time after I leave [facility].   Patient Goals: Client reports his goal prior to leaving [facility] is to be accepted into a [facility] and remain sober.  Client reports he will continue to detox and receive outpatient therapy resources.   Discharge Plan or Barriers: Client reports no barriers.   Reason for Continuation of Hospitalization: Anxiety Depression, withdrawals  Estimated Length of Stay:01/27/24 - 01/31/24  Last 3 [place] Suicide Severity Risk Score: Flowsheet Row ED from 01/26/2024 in [facility] Most recent reading at 01/27/2024  8:10 PM ED from 01/26/2024 in [facility] Most recent reading at 01/26/2024 10:14 PM UC from 11/26/2023 in [facility] Most recent reading at 11/26/2023  1:25 PM  C-SSRS RISK CATEGORY No Risk No  Risk No Risk    Last PHQ 2/9 Scores:    01/27/2024    1:40 PM 01/26/2024    6:22 PM  Depression screen PHQ 2/9  Decreased Interest 1 1  Down, Depressed, Hopeless 1 1  PHQ - 2 Score 2 2  Altered sleeping 1 1  Tired, decreased energy 1 1  Change in appetite 1 0  Feeling bad or failure about yourself  1 0  Trouble concentrating 1 0  Moving slowly or fidgety/restless 1 0  Suicidal thoughts 1 0  PHQ-9 Score 9 4  Difficult doing work/chores Somewhat difficult Somewhat difficult    Scribe for Treatment Team: Havish Petties, LCSW 01/28/2024 5:19 PM

## 2024-01-28 NOTE — Group Note (Signed)
 Group Topic: Relapse and Recovery  Group Date: 01/28/2024 Start Time: 1100 End Time: 1200 Facilitators: Demeshia Sherburne, LCSW  Department: [facility]  Number of Participants: 5  Group Focus: acceptance, anxiety, chemical dependency issues, community group, and substance abuse education Treatment Modality:  Cognitive Behavioral Therapy and Solution-Focused Therapy Interventions utilized were group exercise and support Purpose: enhance coping skills, express feelings, express irrational fears, regain self-worth, relapse prevention strategies, and trigger / craving management  Writer met with the Group to discuss holiday's and Recovery.  Group members were asked to pick one word describing this year and how substance abuse impacted their goals and decisions.  Writer utilized CBT and Solution Focus therapy to explore triggers and goals for 2026. Name: Jerome Irwin Date of Birth: 29-Jul-1980  MR: [id number]    Level of Participation: active Quality of Participation: cooperative, engaged, and offered feedback Interactions with others: gave feedback Mood/Affect: appropriate and brightens with interaction Triggers (if applicable): other users, holidays, lonely  Cognition: coherent/clear, fearful, goal directed, and logical Progress: Moderate Response: Client was open and gave information from his experience and how he wants more/better self in 2026.  Plan: follow-up as needed  Patients Problems:  Patient Active Problem List   Diagnosis Date Noted   Cocaine use disorder (HCC) 01/26/2024

## 2024-01-29 DIAGNOSIS — F1410 Cocaine abuse, uncomplicated: Secondary | ICD-10-CM | POA: Diagnosis not present

## 2024-01-29 DIAGNOSIS — Z87891 Personal history of nicotine dependence: Secondary | ICD-10-CM | POA: Diagnosis not present

## 2024-01-29 DIAGNOSIS — F1510 Other stimulant abuse, uncomplicated: Secondary | ICD-10-CM | POA: Diagnosis not present

## 2024-01-29 MED ORDER — B COMPLEX-C PO TABS: 1.0000 | ORAL_TABLET | Freq: Every day | ORAL | 0 refills | Status: AC

## 2024-01-29 MED ORDER — HYDROXYZINE HCL 25 MG PO TABS: 25.0000 mg | ORAL_TABLET | Freq: Three times a day (TID) | ORAL | 0 refills | Status: AC | PRN

## 2024-01-29 MED ORDER — TRAZODONE HCL 50 MG PO TABS: 50.0000 mg | ORAL_TABLET | Freq: Every evening | ORAL | 0 refills | Status: AC | PRN

## 2024-01-29 MED ORDER — HYDROXYZINE HCL 25 MG PO TABS: 25.0000 mg | ORAL_TABLET | Freq: Three times a day (TID) | ORAL | Status: DC | PRN

## 2024-01-29 NOTE — Group Note (Signed)
 Group Topic: Recovery Basics  Group Date: 01/29/2024 Start Time: 1000 End Time: 1050 Facilitators: Gerome Jolly, NT  Department: [facility]  Number of Participants: 5  Group Focus: substance abuse education Treatment Modality:  Cognitive Behavioral Therapy, Psychoeducation, Solution-Focused Therapy, and Spiritual Interventions utilized were exploration and story telling Purpose: explore maladaptive thinking, increase insight, and  Facilitator shared his recovery journey, identifying the pitfalls along the way. This was done to illustrate that recovery is possible. The facilitator engaged the group in writing a gratitude list and sharing that list with group if they felt comfortable  Name: Jerome Irwin Date of Birth: 01/18/1981  MR: [id number]    Level of Participation: active Quality of Participation: attentive, cooperative, and engaged Interactions with others: gave feedback Mood/Affect: anxious and appropriate Triggers (if applicable): NA Cognition: coherent/clear and goal directed Progress: Moderate Response: Patient listened and asked question relating to recovery. Patient shared his gratitude list Plan: changes to discharge plan, patient will be discharging today to mother's home. Patient will follow-up with [facility] CD-IOP  Patients Problems:  Patient Active Problem List   Diagnosis Date Noted   Cocaine use disorder (HCC) 01/26/2024

## 2024-01-29 NOTE — Discharge Instructions (Addendum)
 [facility] Care Management....  Patient reported that he can stay with mother    [address]  [place] [address]  [phone]  RN to arrange transportation  7 day samples 30 day scripts  Patient will connect with Peer Support located at [facility] address.  Patient is to follow-up with [facility] for their CD-IOP Monday - Friday from 9 am - 1 pm...  [facility] [address] [place], [place] [address] [phone]       Based on the information that you have provided and the presenting issues outpatient services and resources for have been recommended.  It is imperative that you follow through with treatment recommendations within 5-7 days from the of discharge to mitigate further risk to your safety and mental well-being. A list of referrals has been provided below to get you started.  You are not limited to the list provided.  In case of an urgent crisis, you may contact the Mobile Crisis Unit with [facility] at [phone].   [facility] [address] SECOND FLOOR [place], [place] [address] [phone] OUTPATIENT Walk-in information: Please note, all walk-ins are first come & first serve, with limited number of availability.  Please note that to be eligible for services you must bring: ID or a piece of mail with your name [facility] address  Therapist for therapy:  Monday & Wednesdays: Please ARRIVE at 7:15 AM for registration Will START at 8:00 AM Every 1st & 2nd Friday of the month: Please ARRIVE at 10:15 AM for registration Will START at 1 PM - 5 PM  Psychiatrist for medication management: Monday - Friday:  Please ARRIVE at 7:15 AM for registration Will START at 8:00 AM  Regretfully, due to limited availability, please be aware that you may not been seen on the same day as walk-in. Please consider making an appoint or try again. Thank you for your patience and understanding.

## 2024-01-29 NOTE — ED Notes (Signed)
 Patient A&O x 4, ambulatory. Patient discharged in no acute distress. Patient denied SI/HI, A/VH upon discharge. Patient verbalized understanding of all discharge instructions reviewed on AVS via staff, to include follow up appointments, RX's and safety. Suicide safety plan completed and reviewed with Clinical research associate. A copy given to pt. Pt belongings returned to patient from locker intact. Patient escorted to lobby via staff for transport to destination. Safety maintained.

## 2024-01-29 NOTE — ED Notes (Signed)
"  Patient asleep at this time   "

## 2024-01-29 NOTE — ED Provider Notes (Signed)
 FBC/OBS ASAP Discharge Summary  Date and Time: 01/29/2024 11:06 AM  Name: Jerome Irwin  MRN:  [id number]   Discharge Diagnoses:  Final diagnoses:  Cocaine use disorder [facility])  Stimulant use disorder   HPI: Per initial assessment completed on 01/26/2024:  Jerome Irwin is a 44 year old male. Patient presents to [facility] behavioral health voluntarily for walk-in assessment. Patient would like detox from stimulant, cocaine. He is interested in residential substance use treatment options.  Jerome, Ellouise CROME, FNP, Date of Service: 01/26/2024  6:01 PM).   Patient was recommended to admission in the [facility] with a goal of assisting him with regaining & maintaining his sobriety.    Stay Summary: On initial admission to the [facility], pt was interested in going to a program where he would be able to work such as an [employer], or a [facility] type facility, but he attempted multiple times to call those facilities using numbers provided to him by CSW, and seems like the calls were not being answered.  Call delays at those locations most likely being caused by the holidays which caused pt to change his mind regarding those types of facilities, and preferred pursuing an outpatient substance abuse treatment option instead.   CSW assisted pt with the outpatient program of his choosing as follows: Patient reported that he is willing to do CD-IOP at [facility].  Writer sent secure message to CD-IOP to get patient scheduled Writer will connect patient to Peer Support Specialist at [facility]. Manual, Manter, Care Management  Date of Service: 01/29/2024  8:12 AM.  We are therefore discharging patient with a  plan to follow  up outpatient as noted above, and he will possibly not hear back from the CD-IOP program until Monday, 02/02/24 since it is the holiday weekend.   Patient assessed prior to discharge and presents with a euthymic mood,  attention to personal hygiene and grooming is fair, eye contact is good, speech is clear & coherent. Thought contents are organized and logical, and pt currently denies SI/HI/AVH or paranoia. There is no evidence of delusional thoughts. There are no overt signs of psychosis, pt denies first rank symptoms. Denies being in any physical distress. Discharged on medications as follows: -Trazodone  50 mg nightly PRN for sleep since he has experienced sleep benefits form being on this medication while at our facility. -Educated to continue Azulfidine  1,000 mg tabs TID for his  ulcerative colitis disease (home med), and to f/u with his outpatient GI specialist.   Rationales, benefits, and possible side effects of al medications educated to the patient who verbalized understanding of all. Trazodone  x 30 day sent to his preferred pharmacy, and he is to get the medication for his UC from his GI provider/PCP. Pt also educated as below: Get help right away if: You have thoughts about hurting yourself or others. Get help right away if you feel like you may hurt yourself or others, or have thoughts about taking your own life. Go to your nearest emergency room or: Call 911. Call the National Suicide Prevention Lifeline at 631-846-7471 or 988 in the [place] This is open 24 hours a day. If youre a Veteran: Call 988 and press 1. This is open 24 hours a day. Text the [employer] at 816-246-6724. Mental health is not just the absence of mental illness. It involves understanding your emotions and behaviors, and taking steps to manage them in a healthy way. If you have symptoms of  mental or emotional distress, get help from family, friends, a health care provider, or a mental health professional. Practice good mental health behaviors such as stress management skills, self-calming skills, exercise, healthy sleeping and eating, and supportive relationships. This information is not intended to replace advice given to you  by your health care provider. Make sure you discuss any questions you have with your health care provider.  Education provided on the fact that if experiencing worsening of psychiatry symptoms including suicidal ideations, homicidal ideations, or having auditory/visual hallucinations, etc, to call 911, 988, come back to this location, or go to the nearest ER. Pt verbalized understanding. Verbalized understanding of information above, agreeable to plan as above, and information regarding mental health resources also attached to his AVS.  Total Time spent with patient: 1 hour  Past Psychiatric History: Polysubstance abuse  Past Medical History:  Past Medical History:  Diagnosis Date   Asthma    Bronchitis    Pneumonia    Ulcerative colitis (HCC)     Family History:  Family History  Problem Relation Age of Onset   Healthy Mother    Healthy Father     Family Psychiatric History: denies  Social History:  Social History   Socioeconomic History   Marital status: Single    Spouse name: Not on file   Number of children: Not on file   Years of education: Not on file   Highest education level: Not on file  Occupational History   Not on file  Tobacco Use   Smoking status: Former    Types: Cigarettes   Smokeless tobacco: Never  Vaping Use   Vaping status: Never Used  Substance and Sexual Activity   Alcohol  use: Not Currently   Drug use: Yes    Types: Cocaine, Marijuana    Comment: occasional cocaine use - last use few days ago; daily marijuana use   Sexual activity: Yes  Other Topics Concern   Not on file  Social History Narrative   Not on file   Social Drivers of Health   Tobacco Use: Medium Risk (11/26/2023)   Patient History    Smoking Tobacco Use: Former    Smokeless Tobacco Use: Never    Passive Exposure: Not on Actuary Strain: Not on file  Food Insecurity: No Food Insecurity (01/26/2024)   Jerome    Worried About Programme Researcher, Broadcasting/film/video in the Last  Year: Never true    Ran Out of Food in the Last Year: Never true  Recent Concern: Food Insecurity - Food Insecurity Present (01/26/2024)   Jerome    Worried About Programme Researcher, Broadcasting/film/video in the Last Year: Sometimes true    Ran Out of Food in the Last Year: Sometimes true  Transportation Needs: No Transportation Needs (01/26/2024)   Jerome    Lack of Transportation (Medical): No    Lack of Transportation (Non-Medical): No  Physical Activity: Not on file  Stress: Not on file  Social Connections: Unknown (06/06/2021)   Received from [facility]   Social Network    Social Network: Not on file  Intimate Partner Violence: Not At Risk (01/26/2024)   Jerome    Fear of Current or Ex-Partner: No    Emotionally Abused: No    Physically Abused: No    Sexually Abused: No  Depression (PHQ2-9): Low Risk (01/29/2024)   Depression (PHQ2-9)    PHQ-2 Score: 0  Recent Concern: Depression (PHQ2-9) - Medium Risk (01/27/2024)   Depression (PHQ2-9)  PHQ-2 Score: 9  Alcohol  Screen: Not on file  Housing: Not on file  Utilities: Patient Unable To Answer (01/26/2024)   Jerome    Threatened with loss of utilities: Patient unable to answer  Health Literacy: Not on file    Tobacco Cessation:  N/A, patient does not currently use tobacco products  Current Medications:  Current Facility-Administered Medications  Medication Dose Route Frequency Provider Last Rate Last Admin   acetaminophen  (TYLENOL ) tablet 650 mg  650 mg Oral Q6H PRN Dasie Ellouise CROME, FNP       alum & mag hydroxide-simeth (MAALOX/MYLANTA) 200-200-20 MG/5ML suspension 30 mL  30 mL Oral Q4H PRN Dasie Ellouise CROME, FNP       B-complex with vitamin C tablet 1 tablet  1 tablet Oral Daily Bethea, Terrence C, MD   1 tablet at 01/29/24 9087   dicyclomine  (BENTYL ) tablet 20 mg  20 mg Oral QID PRN Allen, Tina L, FNP       haloperidol  (HALDOL ) tablet 5 mg  5 mg Oral TID PRN Dasie Ellouise CROME, FNP       And   diphenhydrAMINE  (BENADRYL ) capsule 50 mg  50 mg Oral TID PRN  Dasie Ellouise CROME, FNP       haloperidol  lactate (HALDOL ) injection 5 mg  5 mg Intramuscular TID PRN Dasie Ellouise CROME, FNP       And   diphenhydrAMINE  (BENADRYL ) injection 50 mg  50 mg Intramuscular TID PRN Dasie Ellouise CROME, FNP       And   LORazepam  (ATIVAN ) injection 2 mg  2 mg Intramuscular TID PRN Dasie Ellouise CROME, FNP       haloperidol  lactate (HALDOL ) injection 10 mg  10 mg Intramuscular TID PRN Dasie Ellouise CROME, FNP       And   diphenhydrAMINE  (BENADRYL ) injection 50 mg  50 mg Intramuscular TID PRN Dasie Ellouise CROME, FNP       And   LORazepam  (ATIVAN ) injection 2 mg  2 mg Intramuscular TID PRN Allen, Tina L, FNP       loperamide  (IMODIUM ) capsule 2 mg  2 mg Oral QID PRN Dasie Ellouise CROME, FNP   2 mg at 01/28/24 1649   magnesium  hydroxide (MILK OF MAGNESIA) suspension 30 mL  30 mL Oral Daily PRN Allen, Tina L, FNP       sulfaSALAzine  (AZULFIDINE ) tablet 1,000 mg  1,000 mg Oral TID Dasie Ellouise CROME, FNP   1,000 mg at 01/29/24 0912   traZODone  (DESYREL ) tablet 50 mg  50 mg Oral QHS PRN Allen, Tina L, FNP   50 mg at 01/28/24 2115   Current Outpatient Medications  Medication Sig Dispense Refill   Multiple Vitamin (MULTIVITAMIN WITH MINERALS) TABS tablet Take 1 tablet by mouth daily.     sulfaSALAzine  (AZULFIDINE ) 500 MG tablet Take 2 tablets (1,000 mg total) by mouth 3 (three) times daily. 180 tablet 0   [START ON 01/30/2024] B Complex-C (B-COMPLEX WITH VITAMIN C) tablet Take 1 tablet by mouth daily. 30 tablet 0   traZODone  (DESYREL ) 50 MG tablet Take 1 tablet (50 mg total) by mouth at bedtime as needed for sleep. 30 tablet 0    PTA Medications:  Facility Ordered Medications  Medication   acetaminophen  (TYLENOL ) tablet 650 mg   alum & mag hydroxide-simeth (MAALOX/MYLANTA) 200-200-20 MG/5ML suspension 30 mL   magnesium  hydroxide (MILK OF MAGNESIA) suspension 30 mL   sulfaSALAzine  (AZULFIDINE ) tablet 1,000 mg   traZODone  (DESYREL ) tablet 50 mg   dicyclomine  (BENTYL ) tablet  20 mg   loperamide  (IMODIUM ) capsule 2  mg   haloperidol  (HALDOL ) tablet 5 mg   And   diphenhydrAMINE  (BENADRYL ) capsule 50 mg   haloperidol  lactate (HALDOL ) injection 5 mg   And   diphenhydrAMINE  (BENADRYL ) injection 50 mg   And   LORazepam  (ATIVAN ) injection 2 mg   haloperidol  lactate (HALDOL ) injection 10 mg   And   diphenhydrAMINE  (BENADRYL ) injection 50 mg   And   LORazepam  (ATIVAN ) injection 2 mg   B-complex with vitamin C tablet 1 tablet   PTA Medications  Medication Sig   sulfaSALAzine  (AZULFIDINE ) 500 MG tablet Take 2 tablets (1,000 mg total) by mouth 3 (three) times daily.   Multiple Vitamin (MULTIVITAMIN WITH MINERALS) TABS tablet Take 1 tablet by mouth daily.   [START ON 01/30/2024] B Complex-C (B-COMPLEX WITH VITAMIN C) tablet Take 1 tablet by mouth daily.   traZODone  (DESYREL ) 50 MG tablet Take 1 tablet (50 mg total) by mouth at bedtime as needed for sleep.       01/29/2024   10:24 AM 01/27/2024    1:40 PM 01/26/2024    6:22 PM  Depression screen PHQ 2/9  Decreased Interest 0 1 1  Down, Depressed, Hopeless 0 1 1  PHQ - 2 Score 0 2 2  Altered sleeping 0 1 1  Tired, decreased energy 0 1 1  Change in appetite 0 1 0  Feeling bad or failure about yourself  0 1 0  Trouble concentrating 0 1 0  Moving slowly or fidgety/restless 0 1 0  Suicidal thoughts 0 1 0  PHQ-9 Score 0 9 4  Difficult doing work/chores Not difficult at all Somewhat difficult Somewhat difficult    Flowsheet Row ED from 01/26/2024 in [facility] Most recent reading at 01/29/2024 10:24 AM ED from 01/26/2024 in [facility] Most recent reading at 01/26/2024 10:14 PM UC from 11/26/2023 in [facility] Most recent reading at 11/26/2023  1:25 PM  C-SSRS RISK CATEGORY Error: Q3, 4, or 5 should not be populated when Q2 is No No Risk No Risk    Musculoskeletal  Strength & Muscle Tone: within normal limits Gait & Station: normal Patient leans: N/A  Psychiatric  Specialty Exam  Presentation  General Appearance:  Appropriate for Environment; Casual  Eye Contact: Fair  Speech: Clear and Coherent  Speech Volume: Normal  Handedness: Right   Mood and Affect  Mood: Euthymic  Affect: Congruent   Thought Process  Thought Processes: Coherent  Descriptions of Associations:Intact  Orientation:Full (Time, Place and Person)  Thought Content:WDL  Diagnosis of Schizophrenia or Schizoaffective disorder in past: No    Hallucinations:Hallucinations: None  Ideas of Reference:None  Suicidal Thoughts:Suicidal Thoughts: No  Homicidal Thoughts:Homicidal Thoughts: No   Sensorium  Memory: Immediate Good  Judgment: Good  Insight: Good   Executive Functions  Concentration: Good  Attention Span: Good  Recall: Good  Fund of Knowledge: Good  Language: Good   Psychomotor Activity  Psychomotor Activity:Psychomotor Activity: Normal   Assets  Assets: Resilience   Sleep  Sleep:Sleep: Good  Estimated Sleeping Duration (Last 24 Hours): 10.25-10.50 hours  Nutritional Assessment (For OBS and FBC admissions only) Has the patient had a weight loss or gain of 10 pounds or more in the last 3 months?: No Has the patient had a decrease in food intake/or appetite?: No Does the patient have dental problems?: No Does the patient have eating habits or behaviors that may be  indicators of an eating disorder including binging or inducing vomiting?: No Has the patient recently lost weight without trying?: 0 Has the patient been eating poorly because of a decreased appetite?: 0 Malnutrition Screening Tool Score: 0   Physical Exam  Physical Exam Vitals reviewed.  Neurological:     General: No focal deficit present.     Mental Status: He is oriented to person, place, and time.  Psychiatric:        Mood and Affect: Mood normal.        Behavior: Behavior normal.        Thought Content: Thought content normal.         Judgment: Judgment normal.    Review of Systems  Psychiatric/Behavioral:  Positive for substance abuse (motivated to stop using). Negative for depression, hallucinations, memory loss and suicidal ideas. The patient is nervous/anxious (stable) and has insomnia (stable on current medication).   All other systems reviewed and are negative.  Blood pressure 115/81, pulse 79, temperature 97.9 F (36.6 C), temperature source Oral, resp. rate 16, SpO2 93%. There is no height or weight on file to calculate BMI.  Demographic Factors:  Male and Low socioeconomic status  Loss Factors: Financial problems/change in socioeconomic status  Historical Factors: NA  Risk Reduction Factors:   Living with another person, especially a relative  Continued Clinical Symptoms:  Alcohol /Substance Abuse/Dependencies  Cognitive Features That Contribute To Risk:  None    Suicide Risk:  Mild: Denies Suicidal ideation of limited frequency. There are no identifiable plans, no associated intent, mild dysphoria and related symptoms, good self-control (both objective and subjective assessment), few other risk factors, and identifiable protective factors, including available and accessible social support.  Donia Snell, NP 01/29/2024, 11:06 AM

## 2024-01-29 NOTE — Care Management (Addendum)
 FBC Care Management...  Addendum 8:30 am  Writer left HIPAA compliant message for mom to return call   Writer met with patient to discuss discharge planning.  Patient reported that he did not have any success with [facility] due to being unemployed.  Patient reported that he is willing to do CD-IOP at [facility].   Writer sent secure message to CD-IOP to get patient scheduled  Writer will connect patient to Peer Support Specialist at [facility]  Patient reported that he can stay with mother   [address]  [place] [address]  [phone]  Writer will confirm patient discharging to mom house

## 2024-01-29 NOTE — ED Notes (Signed)
 Patient asleep, NAD. Will continue to monitor for safety.

## 2024-01-29 NOTE — Group Note (Addendum)
 Group Topic: Recovery Basics  Group Date: 01/28/2024 Start Time: 2005 End Time: 2105 Facilitators: Nathanael Moulder, NT  Department: [facility]  Number of Participants: 9  Group Focus: abuse issues, check in, chemical dependency education, chemical dependency issues, co-dependency, community group, relapse prevention, and substance abuse education Treatment Modality:  Psychoeducation Interventions utilized were reminiscence, story telling, and support Purpose: enhance coping skills and relapse prevention strategies  Name: Jerome Irwin Date of Birth: 18-Sep-1980  MR: [id number]    Level of Participation: active Quality of Participation: cooperative and engaged Interactions with others: Appropriate Mood/Affect: appropriate Triggers (if applicable): None noticed Cognition: coherent/clear Progress: Gaining insight Response: Appropriate Plan: patient will be encouraged to attend future groups  Patients Problems:  Patient Active Problem List   Diagnosis Date Noted   Cocaine use disorder (HCC) 01/26/2024

## 2024-01-30 ENCOUNTER — Encounter (HOSPITAL_COMMUNITY): Payer: Self-pay

## 2024-01-30 ENCOUNTER — Telehealth (HOSPITAL_COMMUNITY): Payer: Self-pay

## 2024-01-30 NOTE — Telephone Encounter (Signed)
 Pt calls and leaves a message about getting into [facility] IOP. He was d/c yesterday from detox. Therapist calls [facility] but there was no answer and the voicemail was full. Therapist will send a letter to the address given by [facility].  Darice Simpler, MS, LMFT, LCAS

## 2024-02-03 ENCOUNTER — Ambulatory Visit (HOSPITAL_COMMUNITY)

## 2024-02-03 ENCOUNTER — Encounter (HOSPITAL_COMMUNITY): Payer: Self-pay

## 2024-02-03 DIAGNOSIS — F1220 Cannabis dependence, uncomplicated: Secondary | ICD-10-CM

## 2024-02-03 DIAGNOSIS — F1420 Cocaine dependence, uncomplicated: Secondary | ICD-10-CM
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
## 2024-02-26 ENCOUNTER — Ambulatory Visit (HOSPITAL_COMMUNITY)

## 2024-02-26 DIAGNOSIS — F1220 Cannabis dependence, uncomplicated: Secondary | ICD-10-CM

## 2024-02-26 DIAGNOSIS — F1420 Cocaine dependence, uncomplicated: Secondary | ICD-10-CM

## 2024-02-26 NOTE — Progress Notes (Addendum)
 " THERAPIST PROGRESS NOTE  Session Time: 1:00pm to 1:53 pm  Type of Therapy: Individual   Therapist Response/Interventions: Psycho-education:  Teaching Jerome Irwin about Post Acute Withdrawal Phase so he will know what to expect should he decide to stop using drugs completely/Explain that addiction is a pre-disposed gently   Summary: Jerome Irwin presents in person for therapy today.Jerome Irwin He reports the last time he used cocaine was yesterday. He says he only used .2 grams in the last couple of days. He says he is only using on occasion now.  Jerome Irwin admits to continuing to smoke cannabis as he says that is what relaxes him the most.  Jerome Irwin says he has two special need brothers and he lives in the same house with them and his mother.  He says he has no NCDL but recently bought a bicycle.   Jerome Irwin says that next week he plans to get out and start putting in applications.  He says he is not choosey about jobs as he believes he can work most any job for a while and if it is not a good fit, he can search for a different one.  Jerome Irwin says he has the goal of reporting he is feeling better each time he comes in and he thinks he can make that happen by doing the right thing and taking medication. He request refills from when he was in [facility]. Therapist explains that he will now need to get established with an oupt psychiatric provider. He agrees to walk in next week.  Front desk provides him with the walk in hours/days.  Jerome Irwin says he needs to start putting positive things in his life. He says he has a stomach disease he got while in prison. He says it was the result of stress. Jerome Irwin says he needs a MD but does not have insurance. Therapist provides Jerome Irwin with two indigent resources for medical care.    Therapy Goals:  Template: Substance Use Disorder         Problem: Substance Use     Dates: Start:  02/03/24       Disciplines: Interdisciplinary, PROVIDER        Goal:  Jerome Irwin will engage in one monthly  non-judgmental check-in with therapist to discuss how using behavior is affecting his well being. By Month 6, Jerome Irwin will identify two specific 'cons' or risks of  current behavior during a self-evaluation exercise.     Dates: Start:  02/03/24    Expected End:  07/03/24       Disciplines: Interdisciplinary, PROVIDER             Goal:  Jerome Irwin will increase coping skills to decrease stress in his life AEB identifying 5 coping skills, practice the skill and reporting how the skill was implemented.     Dates: Start:  02/03/24    Expected End:  07/03/24       Disciplines: Interdisciplinary, PROVIDER             Intervention: Therapist will use motivational interviewing, CBT and Psychoeducation to assist in moving Jerome Irwin from pre-contemplation phase to contemplation stage of change     Dates: Start:  02/03/24                Intervention: Therapist will assist Jerome Irwin identifying coping skills by using psychoeducation, and  CBT interventions     Dates: Start:  02/03/24       Description: Jerome Irwin gives verbal permission for therapist to electronically sign his Care Plan on  his behalf.                Plan: Return for in person therapy on 03-23-24 at 3pm.   Patient/Guardian was advised Release of Information must be obtained prior to any record release in order to collaborate their care with an outside provider. Patient/Guardian was advised if they have not already done so to contact the registration department to sign all necessary forms in order for us  to release information regarding their care.   Consent: Patient/Guardian gives verbal consent for treatment and assignment of benefits for services provided during this visit. Patient/Guardian expressed understanding and agreed to proceed.   Jerome Simpler, MS, LMFT, LCAS "

## 2024-03-23 ENCOUNTER — Ambulatory Visit (HOSPITAL_COMMUNITY)

## 2024-04-21 ENCOUNTER — Ambulatory Visit: Payer: Self-pay | Admitting: Nurse Practitioner
